# Patient Record
Sex: Female | Born: 1965 | Race: White | Hispanic: No | Marital: Married | Smoking: Never smoker
Health system: Southern US, Community
[De-identification: ages and names within clinical notes are randomized; demographics above are authoritative.]

## PROBLEM LIST (undated history)

## (undated) DIAGNOSIS — K7581 Nonalcoholic steatohepatitis (NASH): Secondary | ICD-10-CM

## (undated) DIAGNOSIS — K760 Fatty (change of) liver, not elsewhere classified: Secondary | ICD-10-CM

## (undated) DIAGNOSIS — E119 Type 2 diabetes mellitus without complications: Secondary | ICD-10-CM

## (undated) DIAGNOSIS — I864 Gastric varices: Secondary | ICD-10-CM

## (undated) DIAGNOSIS — E669 Obesity, unspecified: Secondary | ICD-10-CM

## (undated) HISTORY — DX: Obesity, unspecified: E66.9

## (undated) HISTORY — PX: ESOPHAGOGASTRODUODENOSCOPY: SHX1529

## (undated) HISTORY — DX: Gastric varices: I86.4

## (undated) HISTORY — DX: Fatty (change of) liver, not elsewhere classified: K76.0

---

## 1999-03-10 ENCOUNTER — Emergency Department (HOSPITAL_COMMUNITY): Admission: EM | Admit: 1999-03-10 | Discharge: 1999-03-10 | Payer: Self-pay | Admitting: *Deleted

## 2001-07-11 ENCOUNTER — Emergency Department (HOSPITAL_COMMUNITY): Admission: EM | Admit: 2001-07-11 | Discharge: 2001-07-11 | Payer: Self-pay | Admitting: Emergency Medicine

## 2005-05-24 ENCOUNTER — Encounter: Admission: RE | Admit: 2005-05-24 | Discharge: 2005-05-24 | Payer: Self-pay | Admitting: Gastroenterology

## 2008-05-05 ENCOUNTER — Ambulatory Visit: Payer: Self-pay | Admitting: Family Medicine

## 2008-05-10 ENCOUNTER — Ambulatory Visit: Payer: Self-pay | Admitting: *Deleted

## 2008-06-14 ENCOUNTER — Ambulatory Visit: Payer: Self-pay | Admitting: Family Medicine

## 2008-06-14 LAB — CONVERTED CEMR LAB
ALT: 35 units/L (ref 0–35)
BUN: 10 mg/dL (ref 6–23)
Basophils Absolute: 0 10*3/uL (ref 0.0–0.1)
Basophils Relative: 1 % (ref 0–1)
CO2: 18 meq/L — ABNORMAL LOW (ref 19–32)
Eosinophils Absolute: 0.1 10*3/uL (ref 0.0–0.7)
Hemoglobin: 14.9 g/dL (ref 12.0–15.0)
Lymphocytes Relative: 46 % (ref 12–46)
Monocytes Absolute: 0.5 10*3/uL (ref 0.1–1.0)
Monocytes Relative: 7 % (ref 3–12)
Neutro Abs: 3 10*3/uL (ref 1.7–7.7)
Platelets: 218 10*3/uL (ref 150–400)
Potassium: 4.2 meq/L (ref 3.5–5.3)
RBC: 4.77 M/uL (ref 3.87–5.11)
RDW: 13.2 % (ref 11.5–15.5)
TSH: 1.429 microintl units/mL (ref 0.350–4.50)

## 2008-06-18 ENCOUNTER — Encounter: Payer: Self-pay | Admitting: Family Medicine

## 2008-06-18 LAB — CONVERTED CEMR LAB: VLDL: 25 mg/dL (ref 0–40)

## 2008-08-02 ENCOUNTER — Ambulatory Visit: Payer: Self-pay | Admitting: Internal Medicine

## 2008-10-11 ENCOUNTER — Encounter: Payer: Self-pay | Admitting: Family Medicine

## 2008-10-11 ENCOUNTER — Ambulatory Visit: Payer: Self-pay | Admitting: Internal Medicine

## 2008-10-11 LAB — CONVERTED CEMR LAB: Chlamydia, DNA Probe: NEGATIVE

## 2008-10-14 ENCOUNTER — Ambulatory Visit (HOSPITAL_COMMUNITY): Admission: RE | Admit: 2008-10-14 | Discharge: 2008-10-14 | Payer: Self-pay | Admitting: Family Medicine

## 2008-11-15 ENCOUNTER — Ambulatory Visit: Payer: Self-pay | Admitting: Internal Medicine

## 2008-11-15 ENCOUNTER — Encounter: Payer: Self-pay | Admitting: Family Medicine

## 2008-11-15 LAB — CONVERTED CEMR LAB
BUN: 11 mg/dL (ref 6–23)
Calcium: 8.9 mg/dL (ref 8.4–10.5)
Chloride: 102 meq/L (ref 96–112)
Creatinine, Ser: 0.7 mg/dL (ref 0.40–1.20)

## 2008-11-22 ENCOUNTER — Ambulatory Visit: Payer: Self-pay | Admitting: Internal Medicine

## 2009-02-07 ENCOUNTER — Ambulatory Visit: Payer: Self-pay | Admitting: Family Medicine

## 2016-04-01 ENCOUNTER — Emergency Department (HOSPITAL_COMMUNITY)
Admission: EM | Admit: 2016-04-01 | Discharge: 2016-04-01 | Disposition: A | Discharge status: Home / Self Care | Payer: Self-pay | Attending: Emergency Medicine | Admitting: Emergency Medicine

## 2016-04-01 ENCOUNTER — Encounter (HOSPITAL_COMMUNITY): Payer: Self-pay | Admitting: Emergency Medicine

## 2016-04-01 DIAGNOSIS — E119 Type 2 diabetes mellitus without complications: Secondary | ICD-10-CM | POA: Insufficient documentation

## 2016-04-01 DIAGNOSIS — W07XXXA Fall from chair, initial encounter: Secondary | ICD-10-CM | POA: Insufficient documentation

## 2016-04-01 DIAGNOSIS — S01112A Laceration without foreign body of left eyelid and periocular area, initial encounter: Secondary | ICD-10-CM | POA: Insufficient documentation

## 2016-04-01 DIAGNOSIS — Y999 Unspecified external cause status: Secondary | ICD-10-CM | POA: Insufficient documentation

## 2016-04-01 DIAGNOSIS — S0191XA Laceration without foreign body of unspecified part of head, initial encounter: Secondary | ICD-10-CM

## 2016-04-01 DIAGNOSIS — Y929 Unspecified place or not applicable: Secondary | ICD-10-CM | POA: Insufficient documentation

## 2016-04-01 DIAGNOSIS — Y939 Activity, unspecified: Secondary | ICD-10-CM | POA: Insufficient documentation

## 2016-04-01 HISTORY — DX: Type 2 diabetes mellitus without complications: E11.9

## 2016-04-01 MED ORDER — LIDOCAINE-EPINEPHRINE 2 %-1:100000 IJ SOLN
20.0000 mL | Freq: Once | INTRAMUSCULAR | Status: AC
  Administered 2016-04-01: 20 mL
  Filled 2016-04-01: qty 1

## 2016-04-01 MED ORDER — BACITRACIN ZINC 500 UNIT/GM EX OINT
1.0000 "application " | TOPICAL_OINTMENT | Freq: Two times a day (BID) | CUTANEOUS | Status: DC
  Filled 2016-04-01: qty 0.9

## 2016-04-01 NOTE — ED Notes (Signed)
ED PA at bedside

## 2016-04-01 NOTE — ED Notes (Signed)
Bed: UI11 Expected date:  Expected time:  Means of arrival:  Comments: 50 yo Fall with head lac

## 2016-04-01 NOTE — ED Provider Notes (Signed)
[facility] DEPT Provider Note   CSN: [id number] Arrival date & time: 04/01/16  1245  First Provider Contact:  First MD Initiated Contact with Patient 04/01/16 1846        History   Chief Complaint Chief Complaint  Patient presents with  . Head Laceration    HPI Deborah Pham is a 50 y.o. female.  HPI history of diabetes who comes in for evaluation of facial laceration. Patient reports approximately 2 hours ago, she tripped over a chair hitting her face on the chair sustaining a laceration to the left eyebrow region. She denies any headache, loss of consciousness, vision changes, neck pain, nausea or vomiting, unusual otorrhea or rhinorrhea. No interventions try to improve symptoms. Palpation worsens discomfort. She reports last tetanus updated in November. No other modifying factors.  Past Medical History:  Diagnosis Date  . Diabetes mellitus without complication ([place])     There are no active problems to display for this patient.   No past surgical history on file.  OB History    No data available       Home Medications    Prior to Admission medications   Not on File    Family History No family history on file.  Social History Social History  Substance Use Topics  . Smoking status: Not on file  . Smokeless tobacco: Not on file  . Alcohol use Not on file     Allergies   Review of patient's allergies indicates no known allergies.   Review of Systems Review of Systems A 10 point review of systems was completed and was negative except for pertinent positives and negatives as mentioned in the history of present illness    Physical Exam Updated Vital Signs BP 132/71 (BP Location: Left Arm)   Pulse 75   Temp 98.7 F (37.1 C) (Oral)   Resp 16   SpO2 98%   Physical Exam  Constitutional: She appears well-developed. No distress.  Awake, alert and nontoxic in appearance  HENT:  Head: Normocephalic.  Right Ear: External ear normal.   Left Ear: External ear normal.  Mouth/Throat: Oropharynx is clear and moist.  Linear laceration that extends from middle left eyebrow to apex of nose bridge. Roughly 1.5 inches in length.  Eyes: Conjunctivae and EOM are normal. Pupils are equal, round, and reactive to light.  Neck: Normal range of motion. No JVD present.  No neck pain  Cardiovascular: Normal rate, regular rhythm and normal heart sounds.   Pulmonary/Chest: Effort normal and breath sounds normal. No stridor.  Abdominal: Soft. There is no tenderness.  Musculoskeletal: Normal range of motion.  Neurological:  Awake, alert, cooperative and aware of situation; motor strength bilaterally; sensation normal to light touch bilaterally; no facial asymmetry; tongue midline; major cranial nerves appear intact;  baseline gait without new ataxia.  Skin: No rash noted. She is not diaphoretic.  Psychiatric: She has a normal mood and affect. Her behavior is normal. Thought content normal.  Nursing note and vitals reviewed.    ED Treatments / Results  Labs (all labs ordered are listed, but only abnormal results are displayed) Labs Reviewed - No data to display  EKG  EKG Interpretation None       Radiology No results found.  Procedures Procedures (including critical care time)   LACERATION REPAIR Performed by: Verl Dicker Authorized by: Verl Dicker Consent: Verbal consent obtained. Risks and benefits: risks, benefits and alternatives were discussed Consent given by: patient Patient identity confirmed: provided  demographic data Prepped and Draped in normal sterile fashion Wound explored  Laceration Location: Left eyebrow  Laceration Length: 3.5 cm  No Foreign Bodies seen or palpated  Anesthesia: local infiltration  Local anesthetic: lidocaine 1 % with epinephrine  Anesthetic total: 5 ml  Irrigation method: syringe Amount of cleaning: standard  Skin closure: 5-0 Vicryl Rapide   Number of  sutures: 8   Technique: Simple interrupted   Patient tolerance: Patient tolerated the procedure well with no immediate complications.   Medications Ordered in ED Medications  bacitracin ointment 1 application (not administered)  lidocaine-EPINEPHrine (XYLOCAINE W/EPI) 2 %-1:100000 (with pres) injection 20 mL (20 mLs Infiltration Given 04/01/16 2011)     Initial Impression / Assessment and Plan / ED Course  I have reviewed the triage vital signs and the nursing notes.  Pertinent labs & imaging results that were available during my care of the patient were reviewed by me and considered in my medical decision making (see chart for details).  Clinical Course   Patient sustained laceration to left eyebrow and bridge of nose area after mechanical fall. [place] head CT rules negative, no indication for further imaging. Tdap UTD. Laceration occurred < 8 hours prior to repair which was well tolerated. Pt has no co morbidities to effect normal wound healing. Discussed suture home care w pt and answered questions. Pt to f-u for wound check in 4 days. Return precautions discussed. Pt overall appears very well, is hemodynamically stable w no complaints prior to dc.     Final Clinical Impressions(s) / ED Diagnoses   Final diagnoses:  Laceration of head, initial encounter    New Prescriptions New Prescriptions   No medications on file     Comer Locket, PA-C 04/01/16 2101    Gareth Morgan, MD 04/03/16 [phone]

## 2016-04-01 NOTE — Discharge Instructions (Signed)
Please keep your wound clean and dry. The stitches will dissolve on their own over the next week. You may continue using Vaseline to help the healing process. Return to ED for new or worsening symptoms or other signs of infection as we discussed.

## 2016-04-01 NOTE — ED Triage Notes (Signed)
Pt fell today after tripping on a chair and now has an approx. 3cm lac over her L eye. Denies LOC. Alert and oriented Hypertensive.

## 2016-06-21 ENCOUNTER — Emergency Department (HOSPITAL_COMMUNITY): Payer: Self-pay

## 2016-06-21 ENCOUNTER — Encounter (HOSPITAL_COMMUNITY): Payer: Self-pay

## 2016-06-21 ENCOUNTER — Inpatient Hospital Stay (HOSPITAL_COMMUNITY)
Admission: EM | Admit: 2016-06-21 | Discharge: 2016-06-23 | DRG: 815 | Disposition: A | Discharge status: Home / Self Care | Payer: Self-pay | Attending: Internal Medicine | Admitting: Internal Medicine

## 2016-06-21 DIAGNOSIS — E872 Acidosis: Secondary | ICD-10-CM | POA: Diagnosis present

## 2016-06-21 DIAGNOSIS — D696 Thrombocytopenia, unspecified: Secondary | ICD-10-CM | POA: Diagnosis present

## 2016-06-21 DIAGNOSIS — E8809 Other disorders of plasma-protein metabolism, not elsewhere classified: Secondary | ICD-10-CM | POA: Diagnosis present

## 2016-06-21 DIAGNOSIS — E669 Obesity, unspecified: Secondary | ICD-10-CM | POA: Diagnosis present

## 2016-06-21 DIAGNOSIS — R188 Other ascites: Secondary | ICD-10-CM | POA: Diagnosis present

## 2016-06-21 DIAGNOSIS — Z7984 Long term (current) use of oral hypoglycemic drugs: Secondary | ICD-10-CM

## 2016-06-21 DIAGNOSIS — E1165 Type 2 diabetes mellitus with hyperglycemia: Secondary | ICD-10-CM | POA: Diagnosis present

## 2016-06-21 DIAGNOSIS — D735 Infarction of spleen: Principal | ICD-10-CM | POA: Diagnosis present

## 2016-06-21 DIAGNOSIS — I868 Varicose veins of other specified sites: Secondary | ICD-10-CM | POA: Diagnosis present

## 2016-06-21 DIAGNOSIS — N10 Acute tubulo-interstitial nephritis: Secondary | ICD-10-CM

## 2016-06-21 DIAGNOSIS — E876 Hypokalemia: Secondary | ICD-10-CM | POA: Diagnosis present

## 2016-06-21 DIAGNOSIS — K7469 Other cirrhosis of liver: Secondary | ICD-10-CM | POA: Diagnosis present

## 2016-06-21 DIAGNOSIS — N390 Urinary tract infection, site not specified: Secondary | ICD-10-CM | POA: Diagnosis present

## 2016-06-21 DIAGNOSIS — K7581 Nonalcoholic steatohepatitis (NASH): Secondary | ICD-10-CM | POA: Diagnosis present

## 2016-06-21 DIAGNOSIS — N3000 Acute cystitis without hematuria: Secondary | ICD-10-CM

## 2016-06-21 DIAGNOSIS — D731 Hypersplenism: Secondary | ICD-10-CM | POA: Diagnosis present

## 2016-06-21 DIAGNOSIS — K766 Portal hypertension: Secondary | ICD-10-CM | POA: Diagnosis present

## 2016-06-21 DIAGNOSIS — R1012 Left upper quadrant pain: Secondary | ICD-10-CM

## 2016-06-21 DIAGNOSIS — Z6841 Body Mass Index (BMI) 40.0 and over, adult: Secondary | ICD-10-CM

## 2016-06-21 HISTORY — DX: Nonalcoholic steatohepatitis (NASH): K75.81

## 2016-06-21 LAB — LIPASE, BLOOD: LIPASE: 95 U/L — AB (ref 11–51)

## 2016-06-21 LAB — URINALYSIS, ROUTINE W REFLEX MICROSCOPIC
Glucose, UA: 500 mg/dL — AB
KETONES UR: NEGATIVE mg/dL
NITRITE: POSITIVE — AB
PROTEIN: 30 mg/dL — AB
Specific Gravity, Urine: 1.016 (ref 1.005–1.030)
pH: 6 (ref 5.0–8.0)

## 2016-06-21 LAB — CBC
HEMATOCRIT: 38.8 % (ref 36.0–46.0)
Hemoglobin: 14.6 g/dL (ref 12.0–15.0)
MCH: 33.2 pg (ref 26.0–34.0)
MCHC: 37.6 g/dL — ABNORMAL HIGH (ref 30.0–36.0)
MCV: 88.2 fL (ref 78.0–100.0)
PLATELETS: 106 10*3/uL — AB (ref 150–400)
RBC: 4.4 MIL/uL (ref 3.87–5.11)
RDW: 13.7 % (ref 11.5–15.5)
WBC: 15.1 10*3/uL — AB (ref 4.0–10.5)

## 2016-06-21 LAB — COMPREHENSIVE METABOLIC PANEL
ALT: 30 U/L (ref 14–54)
AST: 47 U/L — AB (ref 15–41)
Albumin: 2.1 g/dL — ABNORMAL LOW (ref 3.5–5.0)
Alkaline Phosphatase: 225 U/L — ABNORMAL HIGH (ref 38–126)
Anion gap: 10 (ref 5–15)
BILIRUBIN TOTAL: 3.5 mg/dL — AB (ref 0.3–1.2)
BUN: 13 mg/dL (ref 6–20)
CHLORIDE: 99 mmol/L — AB (ref 101–111)
CO2: 23 mmol/L (ref 22–32)
CREATININE: 1.27 mg/dL — AB (ref 0.44–1.00)
Calcium: 8 mg/dL — ABNORMAL LOW (ref 8.9–10.3)
GFR, EST AFRICAN AMERICAN: 56 mL/min — AB (ref >60)
GFR, EST NON AFRICAN AMERICAN: 48 mL/min — AB (ref >60)
Glucose, Bld: 369 mg/dL — ABNORMAL HIGH (ref 65–99)
POTASSIUM: 3.1 mmol/L — AB (ref 3.5–5.1)
Sodium: 132 mmol/L — ABNORMAL LOW (ref 135–145)
TOTAL PROTEIN: 7.1 g/dL (ref 6.5–8.1)

## 2016-06-21 LAB — URINE MICROSCOPIC-ADD ON

## 2016-06-21 LAB — LACTIC ACID, PLASMA: Lactic Acid, Venous: 2.7 mmol/L (ref 0.5–1.9)

## 2016-06-21 LAB — GLUCOSE, CAPILLARY
GLUCOSE-CAPILLARY: 197 mg/dL — AB (ref 65–99)
Glucose-Capillary: 202 mg/dL — ABNORMAL HIGH (ref 65–99)

## 2016-06-21 LAB — PROTIME-INR
INR: 1.29
Prothrombin Time: 16.2 seconds — ABNORMAL HIGH (ref 11.4–15.2)

## 2016-06-21 LAB — CBG MONITORING, ED: Glucose-Capillary: 355 mg/dL — ABNORMAL HIGH (ref 65–99)

## 2016-06-21 LAB — I-STAT BETA HCG BLOOD, ED (MC, WL, AP ONLY): I-stat hCG, quantitative: <5 m[IU]/mL (ref <5)

## 2016-06-21 MED ORDER — SODIUM CHLORIDE 0.9% FLUSH
3.0000 mL | Freq: Two times a day (BID) | INTRAVENOUS | Status: DC
  Administered 2016-06-21 – 2016-06-23 (×4): 3 mL via INTRAVENOUS

## 2016-06-21 MED ORDER — DEXTROSE 5 % IV SOLN
1.0000 g | Freq: Once | INTRAVENOUS | Status: AC
  Administered 2016-06-21: 1 g via INTRAVENOUS
  Filled 2016-06-21: qty 10

## 2016-06-21 MED ORDER — ONDANSETRON HCL 4 MG/2ML IJ SOLN: 4.0000 mg | Freq: Four times a day (QID) | INTRAMUSCULAR | Status: DC | PRN

## 2016-06-21 MED ORDER — ONDANSETRON HCL 4 MG PO TABS: 4.0000 mg | ORAL_TABLET | Freq: Four times a day (QID) | ORAL | Status: DC | PRN

## 2016-06-21 MED ORDER — ACETAMINOPHEN 325 MG PO TABS
650.0000 mg | ORAL_TABLET | Freq: Four times a day (QID) | ORAL | Status: DC | PRN
  Administered 2016-06-21 – 2016-06-22 (×3): 650 mg via ORAL
  Filled 2016-06-21 (×4): qty 2

## 2016-06-21 MED ORDER — ACETAMINOPHEN 650 MG RE SUPP: 650.0000 mg | Freq: Four times a day (QID) | RECTAL | Status: DC | PRN

## 2016-06-21 MED ORDER — SODIUM CHLORIDE 0.9 % IV BOLUS (SEPSIS)
1000.0000 mL | Freq: Once | INTRAVENOUS | Status: AC
Start: 2016-06-21 — End: 2016-06-21
  Administered 2016-06-21: 1000 mL via INTRAVENOUS

## 2016-06-21 MED ORDER — DEXTROSE 5 % IV SOLN
1.0000 g | INTRAVENOUS | Status: DC
  Administered 2016-06-22: 1 g via INTRAVENOUS
  Filled 2016-06-21: qty 10

## 2016-06-21 MED ORDER — INSULIN ASPART 100 UNIT/ML ~~LOC~~ SOLN
0.0000 [IU] | Freq: Three times a day (TID) | SUBCUTANEOUS | Status: DC
  Administered 2016-06-22: 15 [IU] via SUBCUTANEOUS
  Administered 2016-06-22: 5 [IU] via SUBCUTANEOUS
  Administered 2016-06-23: 3 [IU] via SUBCUTANEOUS

## 2016-06-21 MED ORDER — SODIUM CHLORIDE 0.9 % IV BOLUS (SEPSIS)
1000.0000 mL | Freq: Once | INTRAVENOUS | Status: AC
  Administered 2016-06-21: 1000 mL via INTRAVENOUS

## 2016-06-21 MED ORDER — ENOXAPARIN SODIUM 40 MG/0.4ML ~~LOC~~ SOLN
40.0000 mg | SUBCUTANEOUS | Status: DC
  Administered 2016-06-22 – 2016-06-23 (×2): 40 mg via SUBCUTANEOUS
  Filled 2016-06-21 (×2): qty 0.4

## 2016-06-21 MED ORDER — OXYCODONE HCL 5 MG PO TABS: 5.0000 mg | ORAL_TABLET | ORAL | Status: DC | PRN

## 2016-06-21 MED ORDER — POTASSIUM CHLORIDE CRYS ER 20 MEQ PO TBCR
40.0000 meq | EXTENDED_RELEASE_TABLET | Freq: Once | ORAL | Status: AC
  Administered 2016-06-21: 40 meq via ORAL
  Filled 2016-06-21: qty 2

## 2016-06-21 MED ORDER — IOPAMIDOL (ISOVUE-300) INJECTION 61%
INTRAVENOUS | Status: AC
  Filled 2016-06-21: qty 100

## 2016-06-21 MED ORDER — SENNOSIDES-DOCUSATE SODIUM 8.6-50 MG PO TABS: 1.0000 | ORAL_TABLET | Freq: Every evening | ORAL | Status: DC | PRN

## 2016-06-21 MED ORDER — IOPAMIDOL (ISOVUE-300) INJECTION 61%
INTRAVENOUS | Status: AC
  Administered 2016-06-21: 100 mL
  Filled 2016-06-21: qty 100

## 2016-06-21 MED ORDER — INSULIN ASPART 100 UNIT/ML ~~LOC~~ SOLN
0.0000 [IU] | Freq: Every day | SUBCUTANEOUS | Status: DC
  Administered 2016-06-21 – 2016-06-22 (×2): 2 [IU] via SUBCUTANEOUS

## 2016-06-21 NOTE — ED Provider Notes (Signed)
[facility] DEPT Provider Note   CSN: [id number] Arrival date & time: 06/21/16  1403     History   Chief Complaint Chief Complaint  Deborah Pham presents with  . Abdominal Pain    HPI Deborah Pham is a 50 y.o. female.  HPI   Deborah Pham is a 49 year old female, preferred language Spanish, with a history of diabetes on metformin who presents to the emergency department with intermittent left-sided and epigastric abdominal pain for 1 week. Pain is nonradiating, she's never had this before, she has taken Advil with some relief, with associated bloating and anorexia. Deborah Pham had subjective fever, chills, headache which has subsided. Deborah Pham denies alcohol or other drug use. Deborah Pham denies nausea, vomiting, chest pain, shortness of breath, dysuria, vaginal discharge, blood in her stool.  Past Medical History:  Diagnosis Date  . Diabetes mellitus without complication ([place])   . NASH (nonalcoholic steatohepatitis)     Deborah Pham Active Problem List   Diagnosis Date Noted  . 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 Acute pyelonephritis 06/21/2016  . Hypokalemia 06/21/2016  . Other cirrhosis of liver ([place]) 06/21/2016    History reviewed. No pertinent surgical history.  OB History    No data available       Home Medications    Prior to Admission medications   Medication Sig Start Date End Date Taking? Authorizing Provider  metFORMIN (GLUCOPHAGE-XR) 500 MG 24 hr tablet Take 500 mg by mouth daily with breakfast.   Yes Historical Provider, MD    Family History Family History  Problem Relation Age of Onset  . Liver disease Father   . Alcoholism Father     Social History Social History  Substance Use Topics  . Smoking status: Never Smoker  . Smokeless tobacco: Never Used  . Alcohol use No     Allergies   Review of Deborah Pham's allergies indicates no known allergies.   Review of Systems Review of Systems  Constitutional: Positive for appetite change, chills and  fever.  HENT: Negative for sore throat and trouble swallowing.   Respiratory: Negative for cough, chest tightness and shortness of breath.   Cardiovascular: Negative for chest pain.  Gastrointestinal: Positive for abdominal distention and abdominal pain. Negative for blood in stool, constipation, diarrhea, nausea and vomiting.  Genitourinary: Negative for dysuria, hematuria and vaginal discharge.  Musculoskeletal: Negative for back pain.  Skin: Negative for rash.  Neurological: Positive for headaches. Negative for dizziness, syncope, weakness and numbness.  Psychiatric/Behavioral: Negative for confusion.     Physical Exam Updated Vital Signs BP 150/80 (BP Location: Left Arm)   Pulse (!) 130   Temp 98.1 F (36.7 C) (Oral)   Resp 20   SpO2 99%   Physical Exam  Constitutional: She appears well-developed and well-nourished. No distress.  HENT:  Head: Normocephalic and atraumatic.  Mouth/Throat: Mucous membranes are not dry.  Eyes: Conjunctivae are normal. Pupils are equal, round, and reactive to light. No scleral icterus (mild).  Cardiovascular: Regular rhythm and normal heart sounds.  Tachycardia present.  Exam reveals no gallop and no friction rub.   No murmur heard. Pulses:      Dorsalis pedis pulses are 2+ on the right side, and 2+ on the left side.  Pulmonary/Chest: Effort normal and breath sounds normal. No respiratory distress. She has no decreased breath sounds. She has no wheezes. She has no rhonchi. She has no rales.  Abdominal: Soft. Normal appearance and bowel sounds are normal. She exhibits distension. She exhibits no fluid wave. There is tenderness in  the epigastric area and left upper quadrant. There is no tenderness at McBurney's point and negative Murphy's sign.  Musculoskeletal: Normal range of motion. She exhibits edema (1+ pitting of BLE).  Neurological: She is alert. Coordination normal.  Skin: Skin is warm and dry. She is not diaphoretic.  Psychiatric: She has a  normal mood and affect. Her behavior is normal.  Nursing note and vitals reviewed.    ED Treatments / Results  Labs (all labs ordered are listed, but only abnormal results are displayed) Labs Reviewed  LIPASE, BLOOD - Abnormal; Notable for the following:       Result Value   Lipase 95 (*)    All other components within normal limits  COMPREHENSIVE METABOLIC PANEL - Abnormal; Notable for the following:    Sodium 132 (*)    Potassium 3.1 (*)    Chloride 99 (*)    Glucose, Bld 369 (*)    Creatinine, Ser 1.27 (*)    Calcium 8.0 (*)    Albumin 2.1 (*)    AST 47 (*)    Alkaline Phosphatase 225 (*)    Total Bilirubin 3.5 (*)    GFR calc non Af Amer 48 (*)    GFR calc Af Amer 56 (*)    All other components within normal limits  CBC - Abnormal; Notable for the following:    WBC 15.1 (*)    MCHC 37.6 (*)    Platelets 106 (*)    All other components within normal limits  URINALYSIS, ROUTINE W REFLEX MICROSCOPIC (NOT AT [facility]) - Abnormal; Notable for the following:    Color, Urine AMBER (*)    APPearance CLOUDY (*)    Glucose, UA 500 (*)    Hgb urine dipstick LARGE (*)    Bilirubin Urine SMALL (*)    Protein, ur 30 (*)    Nitrite POSITIVE (*)    Leukocytes, UA LARGE (*)    All other components within normal limits  URINE MICROSCOPIC-ADD ON - Abnormal; Notable for the following:    Squamous Epithelial / LPF 6-30 (*)    Bacteria, UA MANY (*)    All other components within normal limits  LACTIC ACID, PLASMA - Abnormal; Notable for the following:    Lactic Acid, Venous 2.7 (*)    All other components within normal limits  PROTIME-INR - Abnormal; Notable for the following:    Prothrombin Time 16.2 (*)    All other components within normal limits  GLUCOSE, CAPILLARY - Abnormal; Notable for the following:    Glucose-Capillary 197 (*)    All other components within normal limits  GLUCOSE, CAPILLARY - Abnormal; Notable for the following:    Glucose-Capillary 202 (*)    All other  components within normal limits  CBG MONITORING, ED - Abnormal; Notable for the following:    Glucose-Capillary 355 (*)    All other components within normal limits  CULTURE, BLOOD (ROUTINE X 2)  CULTURE, BLOOD (ROUTINE X 2)  URINE CULTURE  COMPREHENSIVE METABOLIC PANEL  CBC  I-STAT BETA HCG BLOOD, ED ([facility], [facility], [facility] ONLY)    EKG  EKG Interpretation None       Radiology US Abdomen Complete  Result Date: 06/21/2016 CLINICAL DATA:  Increased bilirubin and abdomen pain for 1 week. EXAM: ABDOMEN ULTRASOUND COMPLETE COMPARISON:  None. FINDINGS: Gallbladder: No gallstones or wall thickening visualized. No sonographic Murphy sign noted by sonographer. Common bile duct: Diameter: 3.7 mm. Liver: No focal lesion identified. There is diffuse increased echotexture  of the liver with nodular contour of the liver border. IVC: No abnormality visualized. Pancreas: Limited visualization. Spleen: Measures 15.7 cm with volume of 987.1 cm 3. 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 Right Kidney: Length: 12.5 cm. Echogenicity within normal limits. No mass or hydronephrosis visualized. Left Kidney: Length: 12.8 cm. Echogenicity within normal limits. No mass or hydronephrosis visualized. Abdominal aorta: No aneurysm visualized. Other findings: None. 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 Electronically Signed   By: Abelardo Diesel M.D.   On: 06/21/2016 18:30   Ct Abdomen Pelvis W Contrast  Result Date: 06/21/2016 CLINICAL DATA:  Abdominal pain EXAM: CT ABDOMEN AND PELVIS WITH CONTRAST TECHNIQUE: Multidetector CT imaging of the abdomen and pelvis was performed using the standard protocol following bolus administration of intravenous contrast. CONTRAST:  127m ISOVUE-300 IOPAMIDOL (ISOVUE-300) INJECTION 61% COMPARISON:  Same day ultrasound of the abdomen FINDINGS: Lower chest: There is cardiomegaly without pericardial effusion. Small hiatal hernia is present. Streaky bibasilar  atelectasis left greater than right with trace effusions. No pneumothorax. Hepatobiliary: Hepatomegaly with nodular liver surface. The left lobe of the liver drapes over the enlarged appearing spleen. The enlarged spleen. Pancreas: Unremarkable. No pancreatic ductal dilatation or surrounding inflammatory changes. Spleen: The spleen is enlarged and measures 14.6 cm AP x 6.6 cm transverse at the splenic hilum. There are triangular areas of hypodensity within this between, the largest measuring approximately 2.2 cm, series 201 image 15 extending to the lateral surface of the spleen. Small amount of fluid is noted medially adjacent to the upper pole of the spleen. Differential possibilities may include splenic laceration from splenomegaly or more likely splenic infarct given similar findings in the kidneys. No acute hemorrhage identified. Adrenals/Urinary Tract: Adrenal glands are unremarkable. Inhomogeneous wedge-shaped shaped areas of hypoperfusion involving both kidneys. Left interpolar medial hypodensity consistent with a cyst measuring approximately 18 mm on coronal reformats. Mild bilateral nephromegaly. Bladder is unremarkable. Stomach/Bowel: The stomach is contracted in appearance. The normal bowel rotation. No inflammation or bowel obstruction. Scattered colonic diverticulosis more so along the sigmoid colon. Appendix is not definitively identified. Vascular/Lymphatic: Paraesophageal, epigastric and splenic varices are noted with splenorenal shunting. No portal or splenic vein thrombosis. Small para-aortic lymph nodes. Reproductive: Uterus and bilateral adnexa are unremarkable. Other: Small amount of ascites overlying the liver, seen within the pelvis and with edema along the left paracolic gutter. Musculoskeletal: No acute osseous abnormality. Osteophytes along the anterior aspect of the visualized lower thoracic spine. L4-5 and L5-S1 facet hypertrophy and sclerosis. IMPRESSION: Hepatosplenomegaly with  cirrhotic appearing liver, small volume ascites and numerous varices as above described. Small hypodense triangular shaped abnormality involving the upper pole of the spleen which likely represents a splenic infarct given similar wedge-shaped areas of hypoperfusion of both kidneys. Subacute splenic laceration as it extends to the surface of the spleen cannot be entirely excluded in light of trace fluid medially in the upper pole, but favor infarcts. Electronically Signed   By: DAshley RoyaltyM.D.   On: 06/21/2016 20:11    Procedures Procedures (including critical care time)  Medications Ordered in ED Medications  iopamidol (ISOVUE-300) 61 % injection (not administered)  insulin aspart (novoLOG) injection 0-15 Units (not administered)  insulin aspart (novoLOG) injection 0-5 Units (2 Units Subcutaneous Given 06/21/16 2339)  enoxaparin (LOVENOX) injection 40 mg (not administered)  sodium chloride flush (NS) 0.9 % injection 3 mL (3 mLs Intravenous Given 06/21/16 2340)  acetaminophen (TYLENOL) tablet 650 mg (not administered)    Or  acetaminophen (TYLENOL) suppository  650 mg (not administered)  oxyCODONE (Oxy IR/ROXICODONE) immediate release tablet 5 mg (not administered)  senna-docusate (Senokot-S) tablet 1 tablet (not administered)  ondansetron (ZOFRAN) tablet 4 mg (not administered)    Or  ondansetron (ZOFRAN) injection 4 mg (not administered)  cefTRIAXone (ROCEPHIN) 1 g in dextrose 5 % 50 mL IVPB (not administered)  sodium chloride 0.9 % bolus 1,000 mL (0 mLs Intravenous Stopped 06/21/16 1956)  potassium chloride SA (K-DUR,KLOR-CON) CR tablet 40 mEq (40 mEq Oral Given 06/21/16 1950)  iopamidol (ISOVUE-300) 61 % injection (100 mLs  Contrast Given 06/21/16 1924)  sodium chloride 0.9 % bolus 1,000 mL (0 mLs Intravenous Stopped 06/21/16 2235)  cefTRIAXone (ROCEPHIN) 1 g in dextrose 5 % 50 mL IVPB (0 g Intravenous Stopped 06/21/16 2235)     Initial Impression / Assessment and Plan / ED Course    I have reviewed the triage vital signs and the nursing notes.  Pertinent labs & imaging results that were available during my care of the Deborah Pham were reviewed by me and considered in my medical decision making (see chart for details).  Clinical Course   4:00pm Discussed lab work with Deborah Pham and indication for imaging. Deborah Pham was agreeable to the plan   Deborah Pham with left upper quadrant and epigastric abdominal pain for 1 week. Labwork significant for leukocytosis, hypokalemia, elevated serum creatinine, thrombocytopenia, elevated alkaline phosphatase, elevated total bilirubin, UA reveals UTI the Deborah Pham is asymptomatic. No anion gap. Ultrasound revealed cirrhosis of liver with splenic varices. CT abdomen revealed hepatosplenomegaly, cirrhotic-appearing liver, small volume of ascites and possible splenic infarct. After assessing the case with Dr. Regenia Skeeter, we felt admission to the hospital was indicated. We'll consult the hospitalist team for admission.  I spoke with Dr. Loleta Books who will admit the Deborah Pham for further evaluation and treatment. Thank you Dr. Loleta Books for your consult, time, and care of this Deborah Pham.  Deborah Pham case discussed with the Deborah Pham seen by Dr. Regenia Skeeter who agrees with the above plan.  Final Clinical Impressions(s) / ED Diagnoses   Final diagnoses:  Left upper quadrant pain    New Prescriptions Current Discharge Medication List       Kalman Drape, [place] 06/22/16 0002    Sherwood Gambler, MD 06/23/16 1009

## 2016-06-21 NOTE — H&P (Signed)
History and Physical  Patient Name: Deborah Pham [facility]     YTK:[id number]    DOB: 04-27-1966    DOA: 06/21/2016 PCP: Thurman Coyer, MD   Patient coming from: Home  Chief Complaint: LUQ abdominal pain  HPI: Deborah Pham is a 50 y.o. female with a past medical history significant for NIDDM, obesity and NASH who presents with 1 week LUQ abdominal pain, worse today.  All history collected through telephonic interpreter.  The patient was in her usual state of health until about one week ago, she was getting ready for bed, when she had sudden onset of noticing some upper abdominal pain, primarily left upper quadrant. Over the next week this was intermittent, worse with movement, but mild to moderate in intensity only, associated with feeling bloated. She took Alka-Seltzer and Advil, which helped somewhat.  Then today, the patient awoke with worse pain. The pain is in the left upper quadrant, it is constant, it is moderate to severe, it does not radiate. It was spontaneous and there is no trauma, but it wasn't more severe than previous and did not go away with home treatments and so she came to the ER.   ED course: -Afebrile, heart rate 130, respirations 30s, blood pressure 150/80, pulse oximetry normal -Na 132 (corrects to 136 given glucose), K 3.1, Cr 1.27 (baseline 0.7, 7 years ago), WBC 15.1 K, Hgb 14.6 -Albumin 2.1, total bilirubin 3.5, thrombocytopenia 106K -Urinalysis shows nitrates, leukocytes to numerous to count -Lipase 95 -Preg test negative -Right upper quadrant ultrasound shows no evidence of cholecystitis, but does show hypersplenism and a nodular contour of the liver, no ascites noted -CT of the abdomen and pelvis with contrast showed splenic infarct, marked hypersplenism, findings of cirrhosis, no mention of ascites -She was given ceftriaxone for UTI/early sepsis and TRH were asked to evaluate for admission  She has had no fever, chills, syncope,  confusion. She has had no cough, no productive cough, no shortness of breath. She has had no dysuria, no hematuria, no urinary frequency, no foul-smelling urine, no urinary urgency.  She has a father with alcoholic liver disease, who is now deceased.  She has been told years ago that she has NASH.        ROS: Review of Systems  Constitutional: Negative for chills, fever and malaise/fatigue.  Gastrointestinal: Positive for abdominal pain. Negative for blood in stool and melena.  Genitourinary: Negative for dysuria, flank pain, frequency, hematuria and urgency.  All other systems reviewed and are negative.         Past Medical History:  Diagnosis Date  . Diabetes mellitus without complication ([place])   . NASH (nonalcoholic steatohepatitis)     History reviewed. No pertinent surgical history.  Social History: Patient lives with her husband.  She worked in a [employer].  The patient walks unassisted.  She does not smoke.  She denies alcohol.    No Known Allergies  Family history: family history includes Alcoholism in her father; Liver disease in her father.  Prior to Admission medications   Medication Sig Start Date End Date Taking? Authorizing Provider  metFORMIN (GLUCOPHAGE-XR) 500 MG 24 hr tablet Take 500 mg by mouth daily with breakfast.   Yes Historical Provider, MD       Physical Exam: BP 134/88   Pulse 115   Temp 98.1 F (36.7 C) (Oral)   Resp (!) 38   SpO2 97%  General appearance: Well-developed, obese adult female, alert and in no acute distress.  Eyes: Anicteric, conjunctiva pink, lids and lashes normal. PERRL.    ENT: No nasal deformity, discharge, epistaxis.  Hearing normal. OP moist without lesions.   Neck: No neck masses.  Trachea midline.  No thyromegaly/tenderness. Lymph: No cervical or supraclavicular lymphadenopathy. Skin: Warm and dry.  No jaundice.  No suspicious rashes or lesions.  No stigmata of chronic liver disease. Cardiac: Tachycardic,  regular, nl S1-S2, no murmurs appreciated.  Capillary refill is brisk.  JVP not visible.  No LE edema.  Radial and DP pulses 2+ and symmetric. Respiratory: Increased respirations, no increased WOB.  CTAB without rales or wheezes. Abdomen: Abdomen soft.  Mild TTP in LUQ with deep palpation, no rebound, guarding. No distension, hepatosplenomegaly given habitus.  I do not appreciate fluid/ascites.   MSK: No deformities or effusions.  No cyanosis or clubbing. Neuro: Cranial nerves normal.  Sensation intact to light touch. Speech is fluent.  Muscle strength normal.    Psych: Sensorium intact and responding to questions, attention normal.  Behavior appropriate.  Affect normal.  Judgment and insight appear normal.     Labs on Admission:  I have personally reviewed following labs and imaging studies: CBC:  Recent Labs Lab 06/21/16 1416  WBC 15.1*  HGB 14.6  HCT 38.8  MCV 88.2  PLT 161*   Basic Metabolic Panel:  Recent Labs Lab 06/21/16 1416  NA 132*  K 3.1*  CL 99*  CO2 23  GLUCOSE 369*  BUN 13  CREATININE 1.27*  CALCIUM 8.0*   GFR: CrCl cannot be calculated (Unknown ideal weight.).  Liver Function Tests:  Recent Labs Lab 06/21/16 1416  AST 47*  ALT 30  ALKPHOS 225*  BILITOT 3.5*  PROT 7.1  ALBUMIN 2.1*    Recent Labs Lab 06/21/16 1416  LIPASE 95*   No results for input(s): AMMONIA in the last 168 hours. Coagulation Profile: No results for input(s): INR, PROTIME in the last 168 hours. Cardiac Enzymes: No results for input(s): CKTOTAL, CKMB, CKMBINDEX, TROPONINI in the last 168 hours. BNP (last 3 results) No results for input(s): PROBNP in the last 8760 hours. HbA1C: No results for input(s): HGBA1C in the last 72 hours. CBG:  Recent Labs Lab 06/21/16 1412  GLUCAP 355*   Lipid Profile: No results for input(s): CHOL, HDL, LDLCALC, TRIG, CHOLHDL, LDLDIRECT in the last 72 hours. Thyroid Function Tests: No results for input(s): TSH, T4TOTAL, FREET4,  T3FREE, THYROIDAB in the last 72 hours. Anemia Panel: No results for input(s): VITAMINB12, FOLATE, FERRITIN, TIBC, IRON, RETICCTPCT in the last 72 hours. Sepsis Labs: Lactate pending Invalid input(s): PROCALCITONIN, LACTICIDVEN No results found for this or any previous visit (from the past 240 hour(s)).       Radiological Exams on Admission: Personally reviewed: US Abdomen Complete  Result Date: 06/21/2016 CLINICAL DATA:  Increased bilirubin and abdomen pain for 1 week. EXAM: ABDOMEN ULTRASOUND COMPLETE COMPARISON:  None. FINDINGS: Gallbladder: No gallstones or wall thickening visualized. No sonographic Murphy sign noted by sonographer. Common bile duct: Diameter: 3.7 mm. Liver: No focal lesion identified. There is diffuse increased echotexture of the liver with nodular contour of the liver border. IVC: No abnormality visualized. Pancreas: Limited visualization. Spleen: Measures 15.7 cm with volume of 987.1 cm 3. 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 Right Kidney: Length: 12.5 cm. Echogenicity within normal limits. No mass or hydronephrosis visualized. Left Kidney: Length: 12.8 cm. Echogenicity within normal limits. No mass or hydronephrosis visualized. Abdominal aorta: No aneurysm visualized. Other findings: None. IMPRESSION: Findings suggesting cirrhosis of liver with splenic  varices identified. Electronically Signed   By: Abelardo Diesel M.D.   On: 06/21/2016 18:30   Ct Abdomen Pelvis W Contrast  Result Date: 06/21/2016 CLINICAL DATA:  Abdominal pain EXAM: CT ABDOMEN AND PELVIS WITH CONTRAST TECHNIQUE: Multidetector CT imaging of the abdomen and pelvis was performed using the standard protocol following bolus administration of intravenous contrast. CONTRAST:  127m ISOVUE-300 IOPAMIDOL (ISOVUE-300) INJECTION 61% COMPARISON:  Same day ultrasound of the abdomen FINDINGS: Lower chest: There is cardiomegaly without pericardial effusion. Small hiatal hernia is present. Streaky bibasilar atelectasis left  greater than right with trace effusions. No pneumothorax. Hepatobiliary: Hepatomegaly with nodular liver surface. The left lobe of the liver drapes over the enlarged appearing spleen. The enlarged spleen. Pancreas: Unremarkable. No pancreatic ductal dilatation or surrounding inflammatory changes. Spleen: The spleen is enlarged and measures 14.6 cm AP x 6.6 cm transverse at the splenic hilum. There are triangular areas of hypodensity within this between, the largest measuring approximately 2.2 cm, series 201 image 15 extending to the lateral surface of the spleen. Small amount of fluid is noted medially adjacent to the upper pole of the spleen. Differential possibilities may include splenic laceration from splenomegaly or more likely splenic infarct given similar findings in the kidneys. No acute hemorrhage identified. Adrenals/Urinary Tract: Adrenal glands are unremarkable. Inhomogeneous wedge-shaped shaped areas of hypoperfusion involving both kidneys. Left interpolar medial hypodensity consistent with a cyst measuring approximately 18 mm on coronal reformats. Mild bilateral nephromegaly. Bladder is unremarkable. Stomach/Bowel: The stomach is contracted in appearance. The normal bowel rotation. No inflammation or bowel obstruction. Scattered colonic diverticulosis more so along the sigmoid colon. Appendix is not definitively identified. Vascular/Lymphatic: Paraesophageal, epigastric and splenic varices are noted with splenorenal shunting. No portal or splenic vein thrombosis. Small para-aortic lymph nodes. Reproductive: Uterus and bilateral adnexa are unremarkable. Other: Small amount of ascites overlying the liver, seen within the pelvis and with edema along the left paracolic gutter. Musculoskeletal: No acute osseous abnormality. Osteophytes along the anterior aspect of the visualized lower thoracic spine. L4-5 and L5-S1 facet hypertrophy and sclerosis. IMPRESSION: Hepatosplenomegaly with cirrhotic appearing  liver, small volume ascites and numerous varices as above described. Small hypodense triangular shaped abnormality involving the upper pole of the spleen which likely represents a splenic infarct given similar wedge-shaped areas of hypoperfusion of both kidneys. Subacute splenic laceration as it extends to the surface of the spleen cannot be entirely excluded in light of trace fluid medially in the upper pole, but favor infarcts. Electronically Signed   By: DAshley RoyaltyM.D.   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  Discussed imaging with Gen Surg, and agree, given time course of pain, abscence of trauma, splenic laceration/rupture is unlikely.  Doubt embolism (endocarditis/atrial fibrillation), but will attempt to rule out.  Unfortunately, cultures not obtained before administration of Abx.    -Oxycodone and/or acetaminophen PRN for pain -Telemetry for afib monitoring overnight -Blood cultures obtained     2. Hypokalemia:  Supplemented orally.  -Repeat BMP tomorrow   3. UTI, possible sepsis:  Meets SIRS criteria at present, although this could also be related to her cirrhosis and splenic infarct.  No symptoms of UTI.  -Continue ceftriaxone -Add urine culture -Check lactic acid and treat with continued IV antibiotics if elevated.   4. NIDDM:   -Hold metformin -SSI while in hospital   5. Cirrhosis:  New diagnosis.  Has previous diagnosis of NASH, and this seems most likely etiology.  Albumin somewhat low, total bilirubin 3.5, platelets mildly low.  No significant fluid overload at present.  -Check INR -Will discuss with GI tomorrow re: inpatient consult or outpatient follow up       DVT prophylaxis: Lovenox  Code Status: FULL  Family Communication: Husband at bedside  Disposition Plan: Anticipate observation overnight for suspected hypersplenism  related infarct, pain control.  Rule out sepsis and discuss with GI for cirrhosis follow up tomorrow. Consults called: None overnight Admission status: OBS At the point of initial evaluation, it is my clinical opinion that admission for OBSERVATION is reasonable and necessary because the patient's presenting complaints in the context of their chronic conditions represent sufficient risk of deterioration or significant morbidity to constitute reasonable grounds for close observation in the hospital setting, but that the patient may be medically stable for discharge from the hospital within 24 to 48 hours.    Medical decision making: Patient seen at 9:05 PM on 06/21/2016.  The patient was discussed with Jackson Latino, PA-C and Gen Surg by phone, who reviewed imaging but were not asked to evaluate the patient.  What exists of the patient's chart was reviewed in depth and summarized above.  Clinical condition: stable.        Edwin Dada [facility] Pager [phone]

## 2016-06-21 NOTE — ED Triage Notes (Signed)
Patient here with generalized abdominal pain with distention and bloating x 1 week. No nausea, no vomiting, denies diarrhea. Reports that her blood sugar has been running high the past few days. Alert and oriented

## 2016-06-22 DIAGNOSIS — N10 Acute tubulo-interstitial nephritis: Secondary | ICD-10-CM

## 2016-06-22 DIAGNOSIS — I8510 Secondary esophageal varices without bleeding: Secondary | ICD-10-CM

## 2016-06-22 DIAGNOSIS — D735 Infarction of spleen: Principal | ICD-10-CM

## 2016-06-22 DIAGNOSIS — R188 Other ascites: Secondary | ICD-10-CM

## 2016-06-22 DIAGNOSIS — K7460 Unspecified cirrhosis of liver: Secondary | ICD-10-CM

## 2016-06-22 DIAGNOSIS — E876 Hypokalemia: Secondary | ICD-10-CM

## 2016-06-22 DIAGNOSIS — K7469 Other cirrhosis of liver: Secondary | ICD-10-CM

## 2016-06-22 LAB — COMPREHENSIVE METABOLIC PANEL
ALK PHOS: 187 U/L — AB (ref 38–126)
ALT: 27 U/L (ref 14–54)
ANION GAP: 10 (ref 5–15)
AST: 37 U/L (ref 15–41)
Albumin: 1.8 g/dL — ABNORMAL LOW (ref 3.5–5.0)
BILIRUBIN TOTAL: 3.4 mg/dL — AB (ref 0.3–1.2)
BUN: 11 mg/dL (ref 6–20)
CALCIUM: 7.6 mg/dL — AB (ref 8.9–10.3)
CO2: 22 mmol/L (ref 22–32)
Chloride: 104 mmol/L (ref 101–111)
Creatinine, Ser: 1.18 mg/dL — ABNORMAL HIGH (ref 0.44–1.00)
GFR, EST NON AFRICAN AMERICAN: 52 mL/min — AB (ref >60)
Glucose, Bld: 240 mg/dL — ABNORMAL HIGH (ref 65–99)
Potassium: 3.6 mmol/L (ref 3.5–5.1)
Sodium: 136 mmol/L (ref 135–145)
TOTAL PROTEIN: 6.5 g/dL (ref 6.5–8.1)

## 2016-06-22 LAB — IRON AND TIBC
IRON: 30 ug/dL (ref 28–170)
SATURATION RATIOS: 16 % (ref 10.4–31.8)
TIBC: 182 ug/dL — ABNORMAL LOW (ref 250–450)
UIBC: 152 ug/dL

## 2016-06-22 LAB — URINALYSIS, ROUTINE W REFLEX MICROSCOPIC
Glucose, UA: 250 mg/dL — AB
KETONES UR: NEGATIVE mg/dL
Nitrite: NEGATIVE
PH: 6 (ref 5.0–8.0)
Protein, ur: NEGATIVE mg/dL
SPECIFIC GRAVITY, URINE: 1.016 (ref 1.005–1.030)

## 2016-06-22 LAB — CBC
HCT: 35 % — ABNORMAL LOW (ref 36.0–46.0)
HEMOGLOBIN: 13 g/dL (ref 12.0–15.0)
MCH: 32.7 pg (ref 26.0–34.0)
MCHC: 36.8 g/dL — AB (ref 30.0–36.0)
MCV: 88.6 fL (ref 78.0–100.0)
Platelets: 94 10*3/uL — ABNORMAL LOW (ref 150–400)
RBC: 3.95 MIL/uL (ref 3.87–5.11)
RDW: 13.9 % (ref 11.5–15.5)
WBC: 16.6 10*3/uL — AB (ref 4.0–10.5)

## 2016-06-22 LAB — RETICULOCYTES
RBC.: 3.96 MIL/uL (ref 3.87–5.11)
RETIC COUNT ABSOLUTE: 99 10*3/uL (ref 19.0–186.0)
RETIC CT PCT: 2.5 % (ref 0.4–3.1)

## 2016-06-22 LAB — URINE MICROSCOPIC-ADD ON

## 2016-06-22 LAB — GLUCOSE, CAPILLARY
GLUCOSE-CAPILLARY: 211 mg/dL — AB (ref 65–99)
GLUCOSE-CAPILLARY: 246 mg/dL — AB (ref 65–99)
GLUCOSE-CAPILLARY: 218 mg/dL — AB (ref 65–99)
Glucose-Capillary: 97 mg/dL (ref 65–99)
Glucose-Capillary: 315 mg/dL — ABNORMAL HIGH (ref 65–99)

## 2016-06-22 LAB — LACTIC ACID, PLASMA: LACTIC ACID, VENOUS: 1.9 mmol/L (ref 0.5–1.9)

## 2016-06-22 LAB — VITAMIN B12: Vitamin B-12: 7308 pg/mL — ABNORMAL HIGH (ref 180–914)

## 2016-06-22 LAB — FOLATE: FOLATE: 16.5 ng/mL (ref >5.9)

## 2016-06-22 LAB — LACTATE DEHYDROGENASE: LDH: 225 U/L — AB (ref 98–192)

## 2016-06-22 LAB — FERRITIN: FERRITIN: 490 ng/mL — AB (ref 11–307)

## 2016-06-22 LAB — BILIRUBIN, DIRECT: Bilirubin, Direct: 2 mg/dL — ABNORMAL HIGH (ref 0.1–0.5)

## 2016-06-22 MED ORDER — NADOLOL 20 MG PO TABS
20.0000 mg | ORAL_TABLET | Freq: Every day | ORAL | Status: DC
  Administered 2016-06-22 – 2016-06-23 (×2): 20 mg via ORAL
  Filled 2016-06-22 (×2): qty 1

## 2016-06-22 NOTE — Progress Notes (Signed)
Pt lactic acid 2.7 on MD was paged called back on phone that he will put an order in for lab to repeat lactic acid level in the morning, will continue to monitor

## 2016-06-22 NOTE — Progress Notes (Signed)
CRITICAL VALUE ALERT  Critical value received:  Lactic acid 2.7  Date of notification:  06/21/2016  Time of notification:  2347  Critical value read back: yes  Nurse who received alert:  Bing Plume  MD notified (1st page):  Hijazi  Time of first page:  2350  MD notified (2nd page): N/A  Time of second page: 2352  Responding MD: N/A  Time MD responded: N/A

## 2016-06-22 NOTE — Progress Notes (Signed)
[facility]                                                                              Patient Demographics  Deborah Pham, is a 50 y.o. female, DOB - 1966-01-25, PZW:[id number]  Admit date - 06/21/2016   Admitting Physician Edwin Dada, MD  Outpatient Primary MD for the patient is Thurman Coyer, MD  Outpatient specialists:   LOS - 0  days    Chief Complaint  Patient presents with  . Abdominal Pain       Brief summary   Patient is a 50 y.o. female with a past medical history significant for NIDDM, obesity and NASH who presents with 1 week LUQ abdominal pain, worse ton the day of admission. The patient was in her usual state of health until about one week ago, she was getting ready for bed, when she had sudden onset of noticing some upper abdominal pain, primarily left upper quadrant. Over the next week this was intermittent, worse with movement, but mild to moderate in intensity only, associated with feeling bloated. Workup showed WBCs 15.1 and albumin 2.1, total bilirubin 3.5, thrombocytopenia, UA with positive nitrites and leukocytes. CT abdomen showed splenic infarct with marked hypersplenism, cirrhosis and varices.   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    - will obtain iron profile to rule out any storage disease, direct Bi for further workup. No hemolytic component on CBC. I had also discussed with hem-onc, Dr Alvy Bimler, who recommended GI workup first and no hematological issues causing splenic infarct at this time. - Doubt hypercoagulability causing chronic infarct in the setting of liver disease.  - GI consulted, will possibly need liver  biopsy for further workup, ?NASH vs storage disease. Patient denies any alcohol use    - Continue pain control   UTI, possible sepsis:  Repeat UA and cultures, for now continue IV Rocephin  Lactic acidosis Continue gentle hydration  Thrombocytopenia in the setting of liver cirrhosis, portal hypertension - Follow closely  Non-insulin-dependent diabetes mellitus -Hold metformin -SSI while in hospital - Follow hemoglobin A1c  Code Status: Full CODE STATUS  DVT Prophylaxis:  Lovenox  Family Communication: Discussed in detail with the patient, all imaging results, lab results explained to the patient and husband at bed side  Disposition Plan:   Time Spent in minutes   25 minutes  Procedures:  CT abd  Consultants:   GI   Antimicrobials:      Medications  Scheduled Meds: . cefTRIAXone (ROCEPHIN)  IV  1 g Intravenous Q24H  . enoxaparin (LOVENOX) injection  40 mg Subcutaneous Q24H  . insulin aspart  0-15 Units Subcutaneous TID WC  . insulin aspart  0-5 Units Subcutaneous QHS  . sodium chloride flush  3 mL Intravenous Q12H   Continuous Infusions:  PRN Meds:.acetaminophen **OR** acetaminophen, ondansetron **OR** ondansetron (ZOFRAN) IV, oxyCODONE, senna-docusate   Antibiotics   Anti-infectives  Start     Dose/Rate Route Frequency Ordered Stop   06/22/16 1900  cefTRIAXone (ROCEPHIN) 1 g in dextrose 5 % 50 mL IVPB     1 g 100 mL/hr over 30 Minutes Intravenous Every 24 hours 06/21/16 2257     06/21/16 1900  cefTRIAXone (ROCEPHIN) 1 g in dextrose 5 % 50 mL IVPB     1 g 100 mL/hr over 30 Minutes Intravenous  Once 06/21/16 1852 06/21/16 2235        Subjective:   Deborah Pham was seen and examined today. States that she feels a lot better today, abdominal pain and distention is improving. Overnight temp of 10 73F.  denies any chest pain or shortness of breath, coughing,new weakness, numbess, tingling.  Objective:   Vitals:   06/21/16 2252 06/21/16  2258 06/22/16 0102 06/22/16 0504  BP:  132/66  (!) 107/57  Pulse:  (!) 109  (!) 102  Resp:  18  18  Temp:  (!) 101 F (38.3 C) 99.2 F (37.3 C) 98.2 F (36.8 C)  TempSrc:  Oral Oral Oral  SpO2:  95%  93%  Weight: 93.8 kg (206 lb 12.7 oz)       Intake/Output Summary (Last 24 hours) at 06/22/16 1126 Last data filed at 06/22/16 0534  Gross per 24 hour  Intake             1470 ml  Output                0 ml  Net             1470 ml     Wt Readings from Last 3 Encounters:  06/21/16 93.8 kg (206 lb 12.7 oz)     Exam  General: Alert and oriented x 3, NAD  HEENT:  PERRLA, EOMI, slight icteric Sclera  Neck: Supple, no JVD, no masses  Cardiovascular: S1 S2 auscultated, no rubs, murmurs or gallops. Regular rate and rhythm.  Respiratory: Clear to auscultation bilaterally, no wheezing, rales or rhonchi  Gastrointestinal: Soft, nontender, nondistended, + bowel sounds  Ext: no cyanosis clubbing or edema  Neuro: AAOx3, Cr N's II- XII. Strength 5/5 upper and lower extremities bilaterally  Skin: No rashes  Psych: Normal affect and demeanor, alert and oriented x3    Data Reviewed:  I have personally reviewed following labs and imaging studies  Micro Results No results found for this or any previous visit (from the past 240 hour(s)).  Radiology Reports US Abdomen Complete  Result Date: 06/21/2016 CLINICAL DATA:  Increased bilirubin and abdomen pain for 1 week. EXAM: ABDOMEN ULTRASOUND COMPLETE COMPARISON:  None. FINDINGS: Gallbladder: No gallstones or wall thickening visualized. No sonographic Murphy sign noted by sonographer. Common bile duct: Diameter: 3.7 mm. Liver: No focal lesion identified. There is diffuse increased echotexture of the liver with nodular contour of the liver border. IVC: No abnormality visualized. Pancreas: Limited visualization. Spleen: Measures 15.7 cm with volume of 987.1 cm 3. 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 Right Kidney: Length: 12.5 cm. Echogenicity  within normal limits. No mass or hydronephrosis visualized. Left Kidney: Length: 12.8 cm. Echogenicity within normal limits. No mass or hydronephrosis visualized. Abdominal aorta: No aneurysm visualized. Other findings: None. 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 Electronically Signed   By: Abelardo Diesel M.D.   On: 06/21/2016 18:30   Ct Abdomen Pelvis W Contrast  Result Date: 06/21/2016 CLINICAL DATA:  Abdominal pain EXAM: CT ABDOMEN AND PELVIS WITH CONTRAST TECHNIQUE: Multidetector CT  imaging of the abdomen and pelvis was performed using the standard protocol following bolus administration of intravenous contrast. CONTRAST:  172m ISOVUE-300 IOPAMIDOL (ISOVUE-300) INJECTION 61% COMPARISON:  Same day ultrasound of the abdomen FINDINGS: Lower chest: There is cardiomegaly without pericardial effusion. Small hiatal hernia is present. Streaky bibasilar atelectasis left greater than right with trace effusions. No pneumothorax. Hepatobiliary: Hepatomegaly with nodular liver surface. The left lobe of the liver drapes over the enlarged appearing spleen. The enlarged spleen. Pancreas: Unremarkable. No pancreatic ductal dilatation or surrounding inflammatory changes. Spleen: The spleen is enlarged and measures 14.6 cm AP x 6.6 cm transverse at the splenic hilum. There are triangular areas of hypodensity within this between, the largest measuring approximately 2.2 cm, series 201 image 15 extending to the lateral surface of the spleen. Small amount of fluid is noted medially adjacent to the upper pole of the spleen. Differential possibilities may include splenic laceration from splenomegaly or more likely splenic infarct given similar findings in the kidneys. No acute hemorrhage identified. Adrenals/Urinary Tract: Adrenal glands are unremarkable. Inhomogeneous wedge-shaped shaped areas of hypoperfusion involving both kidneys. Left interpolar medial hypodensity consistent with a  cyst measuring approximately 18 mm on coronal reformats. Mild bilateral nephromegaly. Bladder is unremarkable. Stomach/Bowel: The stomach is contracted in appearance. The normal bowel rotation. No inflammation or bowel obstruction. Scattered colonic diverticulosis more so along the sigmoid colon. Appendix is not definitively identified. Vascular/Lymphatic: Paraesophageal, epigastric and splenic varices are noted with splenorenal shunting. No portal or splenic vein thrombosis. Small para-aortic lymph nodes. Reproductive: Uterus and bilateral adnexa are unremarkable. Other: Small amount of ascites overlying the liver, seen within the pelvis and with edema along the left paracolic gutter. Musculoskeletal: No acute osseous abnormality. Osteophytes along the anterior aspect of the visualized lower thoracic spine. L4-5 and L5-S1 facet hypertrophy and sclerosis. IMPRESSION: Hepatosplenomegaly with cirrhotic appearing liver, small volume ascites and numerous varices as above described. Small hypodense triangular shaped abnormality involving the upper pole of the spleen which likely represents a splenic infarct given similar wedge-shaped areas of hypoperfusion of both kidneys. Subacute splenic laceration as it extends to the surface of the spleen cannot be entirely excluded in light of trace fluid medially in the upper pole, but favor infarcts. Electronically Signed   By: DAshley RoyaltyM.D.   On: 06/21/2016 20:11    Lab Data:  CBC:  Recent Labs Lab 06/21/16 1416 06/22/16 0533  WBC 15.1* 16.6*  HGB 14.6 13.0  HCT 38.8 35.0*  MCV 88.2 88.6  PLT 106* 94*   Basic Metabolic Panel:  Recent Labs Lab 06/21/16 1416 06/22/16 0533  NA 132* 136  K 3.1* 3.6  CL 99* 104  CO2 23 22  GLUCOSE 369* 240*  BUN 13 11  CREATININE 1.27* 1.18*  CALCIUM 8.0* 7.6*   GFR: CrCl cannot be calculated (Unknown ideal weight.). Liver Function Tests:  Recent Labs Lab 06/21/16 1416 06/22/16 0533  AST 47* 37  ALT 30 27    ALKPHOS 225* 187*  BILITOT 3.5* 3.4*  PROT 7.1 6.5  ALBUMIN 2.1* 1.8*    Recent Labs Lab 06/21/16 1416  LIPASE 95*   No results for input(s): AMMONIA in the last 168 hours. Coagulation Profile:  Recent Labs Lab 06/21/16 2245  INR 1.29   Cardiac Enzymes: No results for input(s): CKTOTAL, CKMB, CKMBINDEX, TROPONINI in the last 168 hours. BNP (last 3 results) No results for input(s): PROBNP in the last 8760 hours. HbA1C: No results for input(s): HGBA1C in the last 72 hours.  CBG:  Recent Labs Lab 06/21/16 1412 06/21/16 2307 06/21/16 2333 06/22/16 0755  GLUCAP 355* 197* 202* 97   Lipid Profile: No results for input(s): CHOL, HDL, LDLCALC, TRIG, CHOLHDL, LDLDIRECT in the last 72 hours. Thyroid Function Tests: No results for input(s): TSH, T4TOTAL, FREET4, T3FREE, THYROIDAB in the last 72 hours. Anemia Panel:  Recent Labs  06/22/16 0751  RETICCTPCT 2.5   Urine analysis:    Component Value Date/Time   COLORURINE AMBER (A) 06/21/2016 1415   APPEARANCEUR CLOUDY (A) 06/21/2016 1415   LABSPEC 1.016 06/21/2016 1415   PHURINE 6.0 06/21/2016 1415   GLUCOSEU 500 (A) 06/21/2016 1415   HGBUR LARGE (A) 06/21/2016 1415   BILIRUBINUR SMALL (A) 06/21/2016 1415   KETONESUR NEGATIVE 06/21/2016 1415   PROTEINUR 30 (A) 06/21/2016 1415   NITRITE POSITIVE (A) 06/21/2016 1415   LEUKOCYTESUR LARGE (A) 06/21/2016 1415     Righteous Claiborne M.D. [facility] 06/22/2016, 11:26 AM  Pager: [phone] Between 7am to 7pm - call Pager - [phone]  After 7pm go to [email or web address] - password [email or web address]  Call night coverage person covering after 7pm

## 2016-06-22 NOTE — Consult Note (Signed)
[facility] Consult: 10:06 AM 06/22/2016  LOS: 0 days    Referring Provider: Dr Tana Coast  Primary Care Physician:  Thurman Coyer, MD Primary Gastroenterologist:  Idalia Needle     Reason for Consultation:  New diagnosis of cirrhosis with complications.   HPI: Deborah Pham is a 50 y.o. female.  PMH DM 2. Obesity. Karlene Lineman.  Came to the emergency room with 1 week of diffuse abdominal pain. No nausea or vomiting. In general her appetite is not great but in the last week it is significantly decreased.  Symptoms started last Thursday. She used for Aleve daily and sometimes Tylenol. The pain got a little bit better but then it came back yesterday, worse and she went to the emergency room. Doesn't drink alcohol or use illicit drugs. No unusual bleeding. CT scan abdomen pelvis shows splenic infarct versus splenic laceration., Hepatosplenomegaly, cirrhosis. Paraesophageal, epigastric, splenic varices with splenorenal shunting.  There is also regions of hypoperfusion in both kidneys and bilateral nephromegaly. Scattered colonic diverticulosis. White blood cell count 16.6. Hemoglobin 13. Platelets low at 94. Audelia Hives are elevated. Transaminases are normal.  Albumin is low.   Patient's father had alcoholic liver disease and died with this in his mid 4s. Patient has never had consultation with GI physicians and has never undergone any endoscopic procedures.  Past Medical History:  Diagnosis Date  . Diabetes mellitus without complication ([place])   . NASH (nonalcoholic steatohepatitis)     History reviewed. No pertinent surgical history.  Prior to Admission medications   Medication Sig Start Date End Date Taking? Authorizing Provider  metFORMIN (GLUCOPHAGE-XR) 500 MG 24 hr tablet Take 500 mg by mouth daily with  breakfast.   Yes Historical Provider, MD    Scheduled Meds: . cefTRIAXone (ROCEPHIN)  IV  1 g Intravenous Q24H  . enoxaparin (LOVENOX) injection  40 mg Subcutaneous Q24H  . insulin aspart  0-15 Units Subcutaneous TID WC  . insulin aspart  0-5 Units Subcutaneous QHS  . sodium chloride flush  3 mL Intravenous Q12H   Infusions:   PRN Meds: acetaminophen **OR** acetaminophen, ondansetron **OR** ondansetron (ZOFRAN) IV, oxyCODONE, senna-docusate   Allergies as of 06/21/2016  . (No Known Allergies)    Family History  Problem Relation Age of Onset  . Liver disease Father   . Alcoholism Father     Social History   Social History  . Marital status: Married    Spouse name: N/A  . Number of children: N/A  . Years of education: N/A   Occupational History  . Not on file.   Social History Main Topics  . Smoking status: Never Smoker  . Smokeless tobacco: Never Used  . Alcohol use No  . Drug use: Unknown  . Sexual activity: Not on file   Other Topics Concern  . Not on file   Social History Narrative  . No narrative on file    REVIEW OF SYSTEMS: Constitutional:  Patient denies fatigue, weakness. She is able to perform house cleaning, cooking. No problems climbing stairs. ENT:  No nose bleeds Pulm:  No shortness of breath or cough. CV:  No palpitations, no LE edema. No chest pain GU:  No hematuria, no frequency.  GI:  Per hpi.  No dysphagia. No heartburn. No nausea vomiting. No dark or bloody bowel movements. Heme:  No previous issues with anemia or need for iron/B12/folate supplementation.   Transfusions:  None Neuro:  No headaches, no peripheral tingling or numbness Derm:  No itching, no rash or sores.  Endocrine:  No sweats or chills.  No polyuria or dysuria.  For about 3 days prior to admit her sugars are reaching into the 300s which is unusually high for her. Immunization:  Did not inquire. Travel:  None beyond local counties in last few months.    PHYSICAL  EXAM: Vital signs in last 24 hours: Vitals:   06/22/16 0102 06/22/16 0504  BP:  (!) 107/57  Pulse:  (!) 102  Resp:  18  Temp: 99.2 F (37.3 C) 98.2 F (36.8 C)   Wt Readings from Last 3 Encounters:  06/21/16 93.8 kg (206 lb 12.7 oz)    General: Obese, comfortable, fully alert. Head:  Cushingoid type faces.  No facial asymmetry.  Eyes:  Slight scleral icterus. No conjunctival pallor. Ears:  Not hard of hearing.  Nose:  No discharge or congestion Mouth:  His membranes are moist and clear. Fair dentition. Neck:  No thyromegaly, masses, JVD. Lungs:  No labored breathing or cough. Lungs clear bilaterally with good breath sounds Heart: RRR. No MRG. Abdomen:  Obese. Soft. Nontender. Not distended. Bowel sounds active. Unable to appreciate HSM or masses..   Rectal: Deferred   Musc/Skeltl: No joint deformities, swelling or erythema. Extremities:  No CCE.  Neurologic:  Alert. Oriented 3. No tremor. No asterixis. No limb weakness. No gross neurologic deficits. Skin:  Sallow/jaundiced. Tattoos:  None seen   Psych:  Calm, cooperative. Not depressed or anxious.  Intake/Output from previous day: 10/26 0701 - 10/27 0700 In: 3875 [P.O.:420; IV Piggyback:1050] Out: -  Intake/Output this shift: No intake/output data recorded.  LAB RESULTS:  Recent Labs  06/21/16 1416 06/22/16 0533  WBC 15.1* 16.6*  HGB 14.6 13.0  HCT 38.8 35.0*  PLT 106* 94*   BMET Lab Results  Component Value Date   NA 136 06/22/2016   NA 132 (L) 06/21/2016   NA 139 11/15/2008   K 3.6 06/22/2016   K 3.1 (L) 06/21/2016   K 3.6 11/15/2008   CL 104 06/22/2016   CL 99 (L) 06/21/2016   CL 102 11/15/2008   CO2 22 06/22/2016   CO2 23 06/21/2016   CO2 26 11/15/2008   GLUCOSE 240 (H) 06/22/2016   GLUCOSE 369 (H) 06/21/2016   GLUCOSE 112 (H) 11/15/2008   BUN 11 06/22/2016   BUN 13 06/21/2016   BUN 11 11/15/2008   CREATININE 1.18 (H) 06/22/2016   CREATININE 1.27 (H) 06/21/2016   CREATININE 0.70  11/15/2008   CALCIUM 7.6 (L) 06/22/2016   CALCIUM 8.0 (L) 06/21/2016   CALCIUM 8.9 11/15/2008   LFT  Recent Labs  06/21/16 1416 06/22/16 0533 06/22/16 0918  PROT 7.1 6.5  --   ALBUMIN 2.1* 1.8*  --   AST 47* 37  --   ALT 30 27  --   ALKPHOS 225* 187*  --   BILITOT 3.5* 3.4*  --   BILIDIR  --   --  2.0*   PT/INR Lab Results  Component Value Date   INR 1.29 06/21/2016   Hepatitis Panel No results for input(s):  HEPBSAG, HCVAB, HEPAIGM, HEPBIGM in the last 72 hours. C-Diff No components found for: CDIFF Lipase     Component Value Date/Time   LIPASE 95 (H) 06/21/2016 1416    Drugs of Abuse  No results found for: LABOPIA, COCAINSCRNUR, LABBENZ, AMPHETMU, THCU, LABBARB   RADIOLOGY STUDIES: US Abdomen Complete  Result Date: 06/21/2016 CLINICAL DATA:  Increased bilirubin and abdomen pain for 1 week. EXAM: ABDOMEN ULTRASOUND COMPLETE COMPARISON:  None. FINDINGS: Gallbladder: No gallstones or wall thickening visualized. No sonographic Murphy sign noted by sonographer. Common bile duct: Diameter: 3.7 mm. Liver: No focal lesion identified. There is diffuse increased echotexture of the liver with nodular contour of the liver border. IVC: No abnormality visualized. Pancreas: Limited visualization. Spleen: Measures 15.7 cm with volume of 987.1 cm 3. 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 Right Kidney: Length: 12.5 cm. Echogenicity within normal limits. No mass or hydronephrosis visualized. Left Kidney: Length: 12.8 cm. Echogenicity within normal limits. No mass or hydronephrosis visualized. Abdominal aorta: No aneurysm visualized. Other findings: None. 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 Electronically Signed   By: Abelardo Diesel M.D.   On: 06/21/2016 18:30   Ct Abdomen Pelvis W Contrast  Result Date: 06/21/2016 CLINICAL DATA:  Abdominal pain EXAM: CT ABDOMEN AND PELVIS WITH CONTRAST TECHNIQUE: Multidetector CT imaging of the abdomen and pelvis  was performed using the standard protocol following bolus administration of intravenous contrast. CONTRAST:  156m ISOVUE-300 IOPAMIDOL (ISOVUE-300) INJECTION 61% COMPARISON:  Same day ultrasound of the abdomen FINDINGS: Lower chest: There is cardiomegaly without pericardial effusion. Small hiatal hernia is present. Streaky bibasilar atelectasis left greater than right with trace effusions. No pneumothorax. Hepatobiliary: Hepatomegaly with nodular liver surface. The left lobe of the liver drapes over the enlarged appearing spleen. The enlarged spleen. Pancreas: Unremarkable. No pancreatic ductal dilatation or surrounding inflammatory changes. Spleen: The spleen is enlarged and measures 14.6 cm AP x 6.6 cm transverse at the splenic hilum. There are triangular areas of hypodensity within this between, the largest measuring approximately 2.2 cm, series 201 image 15 extending to the lateral surface of the spleen. Small amount of fluid is noted medially adjacent to the upper pole of the spleen. Differential possibilities may include splenic laceration from splenomegaly or more likely splenic infarct given similar findings in the kidneys. No acute hemorrhage identified. Adrenals/Urinary Tract: Adrenal glands are unremarkable. Inhomogeneous wedge-shaped shaped areas of hypoperfusion involving both kidneys. Left interpolar medial hypodensity consistent with a cyst measuring approximately 18 mm on coronal reformats. Mild bilateral nephromegaly. Bladder is unremarkable. Stomach/Bowel: The stomach is contracted in appearance. The normal bowel rotation. No inflammation or bowel obstruction. Scattered colonic diverticulosis more so along the sigmoid colon. Appendix is not definitively identified. Vascular/Lymphatic: Paraesophageal, epigastric and splenic varices are noted with splenorenal shunting. No portal or splenic vein thrombosis. Small para-aortic lymph nodes. Reproductive: Uterus and bilateral adnexa are unremarkable.  Other: Small amount of ascites overlying the liver, seen within the pelvis and with edema along the left paracolic gutter. Musculoskeletal: No acute osseous abnormality. Osteophytes along the anterior aspect of the visualized lower thoracic spine. L4-5 and L5-S1 facet hypertrophy and sclerosis. IMPRESSION: Hepatosplenomegaly with cirrhotic appearing liver, small volume ascites and numerous varices as above described. Small hypodense triangular shaped abnormality involving the upper pole of the spleen which likely represents a splenic infarct given similar wedge-shaped areas of hypoperfusion of both kidneys. Subacute splenic laceration as it extends to the surface of the spleen cannot be entirely excluded in light of trace fluid  medially in the upper pole, but favor infarcts. Electronically Signed   By: Ashley Royalty M.D.   On: 06/21/2016 20:11     IMPRESSION:   *  New diagnosis of cirrhosis in patient with obesity and history NASH.  *  Hypersplenism and splenic infarct.  *  Paraesophageal, epigastric, splenic varices.  *  Mild ascites.  *  Thrombocytopenia.  *  DM 2.    *  Probable UTI, though urinary specimen did have significant amount of squamous epithelial cells so not an ideal specimen.  She is getting Rocephin   PLAN:     *  Per Dr Loletha Carrow.    *  She will need upper endoscopy to evaluate the varices.    Azucena Freed  06/22/2016, 10:06 AM   I have reviewed the entire case in detail with the above APP and discussed the plan in detail.  Therefore, I agree with the diagnoses recorded above. In addition,  I have personally interviewed and examined the patient and have personally reviewed any abdominal/pelvic CT scan images.  My additional thoughts are as follows: Interviewed with Technical brewer and husband present at bedside.  ESLD, most likely from long-standing fatty liver  MELD 17  I am not planning to do her EGD as an inpatient.  I gave her my card so they can  follow up in the office.   Given imaging findings, start nadolol 20 mg once daily for suspected esophageal varices.  Splenic infarct is managed conservatively with pain control  Check hepatitis serologies if not already done  Can go home anytime from a GI perspective.  [name] Pager [phone]  Mon-Fri 8a-5p [phone] after 5p, weekends, holidays  Pager: [phone]

## 2016-06-23 LAB — COMPREHENSIVE METABOLIC PANEL
ALBUMIN: 1.7 g/dL — AB (ref 3.5–5.0)
ALT: 24 U/L (ref 14–54)
ANION GAP: 9 (ref 5–15)
AST: 46 U/L — ABNORMAL HIGH (ref 15–41)
Alkaline Phosphatase: 182 U/L — ABNORMAL HIGH (ref 38–126)
BILIRUBIN TOTAL: 3.5 mg/dL — AB (ref 0.3–1.2)
BUN: 11 mg/dL (ref 6–20)
CHLORIDE: 103 mmol/L (ref 101–111)
CO2: 21 mmol/L — ABNORMAL LOW (ref 22–32)
Calcium: 7.6 mg/dL — ABNORMAL LOW (ref 8.9–10.3)
Creatinine, Ser: 0.97 mg/dL (ref 0.44–1.00)
GFR calc Af Amer: >60 mL/min (ref >60)
GLUCOSE: 218 mg/dL — AB (ref 65–99)
POTASSIUM: 3.4 mmol/L — AB (ref 3.5–5.1)
Sodium: 133 mmol/L — ABNORMAL LOW (ref 135–145)
TOTAL PROTEIN: 6.7 g/dL (ref 6.5–8.1)

## 2016-06-23 LAB — HEPATITIS B SURFACE ANTIGEN: Hepatitis B Surface Ag: NEGATIVE

## 2016-06-23 LAB — HEMOGLOBIN A1C
HEMOGLOBIN A1C: 8.9 % — AB (ref 4.8–5.6)
MEAN PLASMA GLUCOSE: 209 mg/dL

## 2016-06-23 LAB — HAPTOGLOBIN: HAPTOGLOBIN: 109 mg/dL (ref 34–200)

## 2016-06-23 LAB — CBC
HEMATOCRIT: 38.3 % (ref 36.0–46.0)
Hemoglobin: 14 g/dL (ref 12.0–15.0)
MCH: 32.4 pg (ref 26.0–34.0)
MCHC: 36.6 g/dL — AB (ref 30.0–36.0)
MCV: 88.7 fL (ref 78.0–100.0)
PLATELETS: 127 10*3/uL — AB (ref 150–400)
RBC: 4.32 MIL/uL (ref 3.87–5.11)
RDW: 14 % (ref 11.5–15.5)
WBC: 12.6 10*3/uL — AB (ref 4.0–10.5)

## 2016-06-23 LAB — URINE CULTURE

## 2016-06-23 LAB — HEPATITIS A ANTIBODY, TOTAL: HEP A TOTAL AB: POSITIVE — AB

## 2016-06-23 LAB — GLUCOSE, CAPILLARY: Glucose-Capillary: 192 mg/dL — ABNORMAL HIGH (ref 65–99)

## 2016-06-23 LAB — HEPATITIS B CORE ANTIBODY, TOTAL: HEP B C TOTAL AB: NEGATIVE

## 2016-06-23 MED ORDER — CEPHALEXIN 500 MG PO CAPS
500.0000 mg | ORAL_CAPSULE | Freq: Two times a day (BID) | ORAL | Status: DC
  Administered 2016-06-23: 500 mg via ORAL
  Filled 2016-06-23: qty 1

## 2016-06-23 MED ORDER — NADOLOL 20 MG PO TABS: 20.0000 mg | ORAL_TABLET | Freq: Every day | ORAL | 3 refills | Status: DC

## 2016-06-23 MED ORDER — METFORMIN HCL 1000 MG PO TABS: 1000.0000 mg | ORAL_TABLET | Freq: Two times a day (BID) | ORAL | 3 refills | Status: DC

## 2016-06-23 MED ORDER — CEPHALEXIN 500 MG PO CAPS: 500.0000 mg | ORAL_CAPSULE | Freq: Two times a day (BID) | ORAL | 0 refills | Status: DC

## 2016-06-23 MED ORDER — POTASSIUM CHLORIDE CRYS ER 20 MEQ PO TBCR
40.0000 meq | EXTENDED_RELEASE_TABLET | Freq: Once | ORAL | Status: AC
  Administered 2016-06-23: 40 meq via ORAL
  Filled 2016-06-23: qty 2

## 2016-06-23 NOTE — Discharge Summary (Signed)
Physician Discharge Summary   Patient ID: ENORA TRILLO MRN: [id number] DOB/AGE: 1965-11-30 50 y.o.  Admit date: 06/21/2016 Discharge date: 06/23/2016  Primary Care Physician:  Thurman Coyer, MD  Discharge Diagnoses:    . Splenic infarction . UTI . Hypokalemia .  cirrhosis of liver (HCC) likely due to [facility] Diabetes mellitus Portal hypertension with varices   Consults:  Gastroenterology, Dr. Loletha Carrow  Recommendations for Outpatient Follow-up:  1. Please repeat CBC/BMET at next visit  DIET: Carb modified diet    Allergies:  No Known Allergies   DISCHARGE MEDICATIONS: Current Discharge Medication List    START taking these medications   Details  cephALEXin (KEFLEX) 500 MG capsule Take 1 capsule (500 mg total) by mouth 2 (two) times daily. X 3 days Qty: 6 capsule, Refills: 0    metFORMIN (GLUCOPHAGE) 1000 MG tablet Take 1 tablet (1,000 mg total) by mouth 2 (two) times daily with a meal. Qty: 60 tablet, Refills: 3    nadolol (CORGARD) 20 MG tablet Take 1 tablet (20 mg total) by mouth daily. Qty: 30 tablet, Refills: 3      STOP taking these medications     metFORMIN (GLUCOPHAGE-XR) 500 MG 24 hr tablet          Brief H and P: For complete details please refer to admission H and P, but in briefPatient is a 50 y.o.femalewith a past medical history significant for NIDDM, obesity and NASHwho presents with 1 week LUQ abdominal pain, worse ton the day of admission. The patient was in her usual state of health until about one week ago, she was getting ready for bed, when she had sudden onset of noticing some upper abdominal pain, primarily left upper quadrant. Over the next week this was intermittent, worse with movement, but mild to moderate in intensity only, associated with feeling bloated. Workup showed WBCs 15.1 and albumin 2.1, total bilirubin 3.5, thrombocytopenia, UA with positive nitrites and leukocytes. CT abdomen showed splenic infarct with  marked hypersplenism, cirrhosis and varices.  Hospital Course:   Abdominal pain with Splenic infarct in the setting of liver cirrhosis, portal hypertension, cirrhosis, hypoalbuminemia, thrombocytopenia, hyperbilirubinemia -Abdominal pain has completely resolved -No prior workup of the liver disease, although patient had prior ultrasound in 04/2005 which had shown diffuse fatty infiltration however spleen was normal.   -Iron profile with ferritin 490, B12 7308. Direct bilirubin 2.0. No hemolytic component on CBC. I had also discussed with hem-onc, Dr Alvy Bimler, who recommended GI workup first and no hematological issues causing splenic infarct at this time. - Doubt hypercoagulability causing chronic infarct in the setting of liver disease.  - GI was consulted, patient was seen by Dr. Simona Huh and recommended outpatient follow-up, endoscopy outpatient, start nadolol 20 mg daily. Hepatitis serology showed positive hepatitis C antibody. At this time patient does not have any pain or nausea or vomiting. She is tolerating diet without any difficulty.    UTI Urine culture negative so far, patient received IV Rocephin during hospitalization, placed on Keflex for 3 days.  Lactic acidosis Continue gentle hydration  Thrombocytopenia in the setting of liver cirrhosis, portal hypertension - Follow closely  Non-insulin-dependent diabetes mellitus -Uncontrolled, metformin was held during hospitalization and patient was placed on sliding scale insulin. Hemoglobin A1c is 8.9. She was on metformin 500 mg daily, increase to 1000 mg twice a day.   Day of Discharge BP (!) 120/58 (BP Location: Right Arm)   Pulse 80   Temp 98.5 F (36.9 C)   Resp 20  Ht 4' 11"  (1.499 m)   Wt 93.8 kg (206 lb 12.7 oz)   SpO2 94%   BMI 41.77 kg/m   Physical Exam: General: Alert and awake oriented x3 not in any acute distress. HEENT: anicteric sclera, pupils reactive to light and accommodation CVS: S1-S2 clear no  murmur rubs or gallops Chest: clear to auscultation bilaterally, no wheezing rales or rhonchi Abdomen: soft nontender, nondistended, normal bowel sounds Extremities: no cyanosis, clubbing or edema noted bilaterally Neuro: Cranial nerves II-XII intact, no focal neurological deficits   The results of significant diagnostics from this hospitalization (including imaging, microbiology, ancillary and laboratory) are listed below for reference.    LAB RESULTS: Basic Metabolic Panel:  Recent Labs Lab 06/22/16 0533 06/23/16 0543  NA 136 133*  K 3.6 3.4*  CL 104 103  CO2 22 21*  GLUCOSE 240* 218*  BUN 11 11  CREATININE 1.18* 0.97  CALCIUM 7.6* 7.6*   Liver Function Tests:  Recent Labs Lab 06/22/16 0533 06/23/16 0543  AST 37 46*  ALT 27 24  ALKPHOS 187* 182*  BILITOT 3.4* 3.5*  PROT 6.5 6.7  ALBUMIN 1.8* 1.7*    Recent Labs Lab 06/21/16 1416  LIPASE 95*   No results for input(s): AMMONIA in the last 168 hours. CBC:  Recent Labs Lab 06/22/16 0533 06/23/16 0543  WBC 16.6* 12.6*  HGB 13.0 14.0  HCT 35.0* 38.3  MCV 88.6 88.7  PLT 94* 127*   Cardiac Enzymes: No results for input(s): CKTOTAL, CKMB, CKMBINDEX, TROPONINI in the last 168 hours. BNP: Invalid input(s): POCBNP CBG:  Recent Labs Lab 06/22/16 2047 06/22/16 2307  GLUCAP 246* 218*    Significant Diagnostic Studies:  US Abdomen Complete  Result Date: 06/21/2016 CLINICAL DATA:  Increased bilirubin and abdomen pain for 1 week. EXAM: ABDOMEN ULTRASOUND COMPLETE COMPARISON:  None. FINDINGS: Gallbladder: No gallstones or wall thickening visualized. No sonographic Murphy sign noted by sonographer. Common bile duct: Diameter: 3.7 mm. Liver: No focal lesion identified. There is diffuse increased echotexture of the liver with nodular contour of the liver border. IVC: No abnormality visualized. Pancreas: Limited visualization. Spleen: Measures 15.7 cm with volume of 987.1 cm 3. 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  Right Kidney: Length: 12.5 cm. Echogenicity within normal limits. No mass or hydronephrosis visualized. Left Kidney: Length: 12.8 cm. Echogenicity within normal limits. No mass or hydronephrosis visualized. Abdominal aorta: No aneurysm visualized. Other findings: None. 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 Electronically Signed   By: Abelardo Diesel M.D.   On: 06/21/2016 18:30   Ct Abdomen Pelvis W Contrast  Result Date: 06/21/2016 CLINICAL DATA:  Abdominal pain EXAM: CT ABDOMEN AND PELVIS WITH CONTRAST TECHNIQUE: Multidetector CT imaging of the abdomen and pelvis was performed using the standard protocol following bolus administration of intravenous contrast. CONTRAST:  189m ISOVUE-300 IOPAMIDOL (ISOVUE-300) INJECTION 61% COMPARISON:  Same day ultrasound of the abdomen FINDINGS: Lower chest: There is cardiomegaly without pericardial effusion. Small hiatal hernia is present. Streaky bibasilar atelectasis left greater than right with trace effusions. No pneumothorax. Hepatobiliary: Hepatomegaly with nodular liver surface. The left lobe of the liver drapes over the enlarged appearing spleen. The enlarged spleen. Pancreas: Unremarkable. No pancreatic ductal dilatation or surrounding inflammatory changes. Spleen: The spleen is enlarged and measures 14.6 cm AP x 6.6 cm transverse at the splenic hilum. There are triangular areas of hypodensity within this between, the largest measuring approximately 2.2 cm, series 201 image 15 extending to the lateral surface of the spleen. Small  amount of fluid is noted medially adjacent to the upper pole of the spleen. Differential possibilities may include splenic laceration from splenomegaly or more likely splenic infarct given similar findings in the kidneys. No acute hemorrhage identified. Adrenals/Urinary Tract: Adrenal glands are unremarkable. Inhomogeneous wedge-shaped shaped areas of hypoperfusion involving both kidneys. Left  interpolar medial hypodensity consistent with a cyst measuring approximately 18 mm on coronal reformats. Mild bilateral nephromegaly. Bladder is unremarkable. Stomach/Bowel: The stomach is contracted in appearance. The normal bowel rotation. No inflammation or bowel obstruction. Scattered colonic diverticulosis more so along the sigmoid colon. Appendix is not definitively identified. Vascular/Lymphatic: Paraesophageal, epigastric and splenic varices are noted with splenorenal shunting. No portal or splenic vein thrombosis. Small para-aortic lymph nodes. Reproductive: Uterus and bilateral adnexa are unremarkable. Other: Small amount of ascites overlying the liver, seen within the pelvis and with edema along the left paracolic gutter. Musculoskeletal: No acute osseous abnormality. Osteophytes along the anterior aspect of the visualized lower thoracic spine. L4-5 and L5-S1 facet hypertrophy and sclerosis. IMPRESSION: Hepatosplenomegaly with cirrhotic appearing liver, small volume ascites and numerous varices as above described. Small hypodense triangular shaped abnormality involving the upper pole of the spleen which likely represents a splenic infarct given similar wedge-shaped areas of hypoperfusion of both kidneys. Subacute splenic laceration as it extends to the surface of the spleen cannot be entirely excluded in light of trace fluid medially in the upper pole, but favor infarcts. Electronically Signed   By: Ashley Royalty M.D.   On: 06/21/2016 20:11    2D ECHO:   Disposition and Follow-up: Discharge Instructions    Diet Carb Modified    Complete by:  As directed    Discharge instructions    Complete by:  As directed    It is VERY IMPORTANT that you follow up with a PCP on a regular basis.  Check your blood glucoses before each meal and at bedtime and maintain a log of your readings.  Bring this log with you when you follow up with your PCP so that he or she can adjust your medications at your follow up  visit.  Please note that Metformin is increased to 1000 mg twice a day.   Increase activity slowly    Complete by:  As directed        DISPOSITION: Eden L, MD. Schedule an appointment as soon as possible for a visit in 10 day(s).   Specialty:  Internal Medicine Contact information: [address] [place] [address] [place], [place]. Schedule an appointment as soon as possible for a visit in 2 week(s).   Specialty:  Gastroenterology Why:  for your liver Contact information: [place]  [address] [phone]            Time spent on Discharge: 29mns  Signed:   RAI,RIPUDEEP M.D. [facility] 06/23/2016, 9:52 AM Pager: [phone]

## 2016-06-23 NOTE — Progress Notes (Signed)
Nsg Discharge Note  Admit Date:  06/21/2016 Discharge date: 06/23/2016   Deborah Pham to be D/C'd Home per MD order.  AVS completed.  Copy for chart, and copy for patient signed, and dated. Patient/caregiver able to verbalize understanding.  Discharge Medication:   Medication List    STOP taking these medications   metFORMIN 500 MG 24 hr tablet Commonly known as:  GLUCOPHAGE-XR Replaced by:  metFORMIN 1000 MG tablet     TAKE these medications   cephALEXin 500 MG capsule Commonly known as:  KEFLEX Take 1 capsule (500 mg total) by mouth 2 (two) times daily. X 3 days   metFORMIN 1000 MG tablet Commonly known as:  GLUCOPHAGE Take 1 tablet (1,000 mg total) by mouth 2 (two) times daily with a meal. Replaces:  metFORMIN 500 MG 24 hr tablet   nadolol 20 MG tablet Commonly known as:  CORGARD Take 1 tablet (20 mg total) by mouth daily.       Discharge Assessment: Vitals:   06/22/16 2047 06/23/16 0516  BP: (!) 105/46 (!) 120/58  Pulse: 81 80  Resp: 20 20  Temp: 98.2 F (36.8 C) 98.5 F (36.9 C)   Skin clean, dry and intact without evidence of skin break down, no evidence of skin tears noted. IV catheter discontinued intact. Site without signs and symptoms of complications - no redness or edema noted at insertion site, patient denies c/o pain - only slight tenderness at site.  Dressing with slight pressure applied.  D/c Instructions-Education: Discharge instructions given to patient/family with verbalized understanding. D/c education completed with patient/family including follow up instructions, medication list, d/c activities limitations if indicated, with other d/c instructions as indicated by MD - patient able to verbalize understanding, all questions fully answered. Patient instructed to return to ED, call 911, or call MD for any changes in condition.  Patient escorted via [place], and D/C home via private auto.  Salley Slaughter, RN 06/23/2016 11:34 AM

## 2016-06-24 LAB — URINE CULTURE: Culture: >=100000 — AB

## 2016-06-25 ENCOUNTER — Encounter: Payer: Self-pay | Admitting: Gastroenterology

## 2016-06-26 LAB — CULTURE, BLOOD (ROUTINE X 2): CULTURE: NO GROWTH

## 2016-06-27 LAB — CULTURE, BLOOD (ROUTINE X 2): CULTURE: NO GROWTH

## 2016-08-09 ENCOUNTER — Encounter (HOSPITAL_COMMUNITY): Payer: Self-pay | Admitting: Emergency Medicine

## 2016-08-09 ENCOUNTER — Emergency Department (HOSPITAL_COMMUNITY): Payer: Self-pay

## 2016-08-09 ENCOUNTER — Emergency Department (HOSPITAL_COMMUNITY)
Admission: EM | Admit: 2016-08-09 | Discharge: 2016-08-09 | Disposition: A | Discharge status: Home / Self Care | Payer: Self-pay | Attending: Emergency Medicine | Admitting: Emergency Medicine

## 2016-08-09 DIAGNOSIS — B9789 Other viral agents as the cause of diseases classified elsewhere: Secondary | ICD-10-CM

## 2016-08-09 DIAGNOSIS — E119 Type 2 diabetes mellitus without complications: Secondary | ICD-10-CM | POA: Insufficient documentation

## 2016-08-09 DIAGNOSIS — Z7984 Long term (current) use of oral hypoglycemic drugs: Secondary | ICD-10-CM | POA: Insufficient documentation

## 2016-08-09 DIAGNOSIS — J40 Bronchitis, not specified as acute or chronic: Secondary | ICD-10-CM | POA: Insufficient documentation

## 2016-08-09 DIAGNOSIS — J069 Acute upper respiratory infection, unspecified: Secondary | ICD-10-CM | POA: Insufficient documentation

## 2016-08-09 MED ORDER — GUAIFENESIN-DM 100-10 MG/5ML PO SYRP: 5.0000 mL | ORAL_SOLUTION | ORAL | 0 refills | Status: DC | PRN

## 2016-08-09 MED ORDER — PSEUDOEPHEDRINE HCL 30 MG PO TABS: 30.0000 mg | ORAL_TABLET | Freq: Four times a day (QID) | ORAL | 0 refills | Status: DC | PRN

## 2016-08-09 MED ORDER — BENZONATATE 100 MG PO CAPS: 100.0000 mg | ORAL_CAPSULE | Freq: Three times a day (TID) | ORAL | 0 refills | Status: DC

## 2016-08-09 NOTE — ED Provider Notes (Signed)
[facility] DEPT Provider Note   CSN: [id number] Arrival date & time: 08/09/16  3875     History   Chief Complaint Chief Complaint  Patient presents with  . Otalgia  . Cough    HPI CECILIA Maryem Shuffler is a 50 y.o. female.  HPI CECILIA Pheonix Wisby is a 50 y.o. female with hx of DM, presents to ED with complaint of cough, nasal congestion, soreThroat since yesterday. She reports subjective fever, did not check her temperature at home. She has tried taking Advil for her possible fever, last dose yesterday. She denies taking any for her so throat or cough. She denies any recent ill contacts. States nothing is making her symptoms worse. She reports some associated shortness of breath. Cough is nonproductive. Denies getting her flu shot this year. No nausea, vomiting or diarrhea. No chest pain. No abdominal pain. No other complaints.  Past Medical History:  Diagnosis Date  . Diabetes mellitus without complication ([place])   . NASH (nonalcoholic steatohepatitis)     Patient Active Problem List   Diagnosis Date Noted  . 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 Acute pyelonephritis 06/21/2016  . Hypokalemia 06/21/2016  . Other cirrhosis of liver ([place]) 06/21/2016    History reviewed. No pertinent surgical history.  OB History    No data available       Home Medications    Prior to Admission medications   Medication Sig Start Date End Date Taking? Authorizing Provider  cephALEXin (KEFLEX) 500 MG capsule Take 1 capsule (500 mg total) by mouth 2 (two) times daily. X 3 days 06/23/16   Ripudeep Krystal Eaton, MD  metFORMIN (GLUCOPHAGE) 1000 MG tablet Take 1 tablet (1,000 mg total) by mouth 2 (two) times daily with a meal. 06/23/16   Ripudeep K Rai, MD  nadolol (CORGARD) 20 MG tablet Take 1 tablet (20 mg total) by mouth daily. 06/23/16   Ripudeep Krystal Eaton, MD    Family History Family History  Problem Relation Age of Onset  . Liver disease Father   . Alcoholism Father      Social History Social History  Substance Use Topics  . Smoking status: Never Smoker  . Smokeless tobacco: Never Used  . Alcohol use No     Allergies   Patient has no known allergies.   Review of Systems Review of Systems  Constitutional: Positive for chills and fever.  HENT: Positive for congestion and sore throat.   Respiratory: Positive for cough. Negative for chest tightness and shortness of breath.   Cardiovascular: Negative for chest pain, palpitations and leg swelling.  Gastrointestinal: Negative for abdominal pain, diarrhea, nausea and vomiting.  Genitourinary: Negative for dysuria, flank pain, pelvic pain, vaginal bleeding, vaginal discharge and vaginal pain.  Musculoskeletal: Negative for arthralgias, myalgias, neck pain and neck stiffness.  Skin: Negative for rash.  Neurological: Negative for dizziness, weakness and headaches.  All other systems reviewed and are negative.    Physical Exam Updated Vital Signs BP 130/81 (BP Location: Left Arm)   Pulse 107   Temp 98.6 F (37 C) (Oral)   Resp 17   SpO2 98%   Physical Exam  Constitutional: She appears well-developed and well-nourished. No distress.  HENT:  Head: Normocephalic and atraumatic.  Right Ear: Tympanic membrane, external ear and ear canal normal.  Left Ear: Tympanic membrane, external ear and ear canal normal.  Nose: Mucosal edema and rhinorrhea present.  Mouth/Throat: Uvula is midline, oropharynx is clear and moist and mucous membranes are normal.  Eyes: Conjunctivae  are normal.  Neck: Neck supple.  Cardiovascular: Normal rate, regular rhythm and normal heart sounds.   Pulmonary/Chest: Effort normal and breath sounds normal. No respiratory distress. She has no wheezes. She has no rales.  Abdominal: Soft. Bowel sounds are normal. She exhibits no distension. There is no tenderness. There is no rebound.  Musculoskeletal: She exhibits no edema.  Neurological: She is alert.  Skin: Skin is warm and  dry.  Psychiatric: She has a normal mood and affect. Her behavior is normal.  Nursing note and vitals reviewed.    ED Treatments / Results  Labs (all labs ordered are listed, but only abnormal results are displayed) Labs Reviewed - No data to display  EKG  EKG Interpretation None       Radiology Dg Chest 2 View  Result Date: 08/09/2016 CLINICAL DATA:  Cough and shortness of breath for 2 days. EXAM: CHEST  2 VIEW COMPARISON:  None. FINDINGS: The cardiomediastinal silhouette is unremarkable. Peribronchial thickening is present. There is no evidence of focal airspace disease, pulmonary edema, suspicious pulmonary nodule/mass, pleural effusion, or pneumothorax. No acute bony abnormalities are identified. IMPRESSION: Peribronchial thickening/bronchitis changes of uncertain chronicity. No evidence of focal pneumonia. Electronically Signed   By: Margarette Canada M.D.   On: 08/09/2016 09:08    Procedures Procedures (including critical care time)  Medications Ordered in ED Medications - No data to display   Initial Impression / Assessment and Plan / ED Course  I have reviewed the triage vital signs and the nursing notes.  Pertinent labs & imaging results that were available during my care of the patient were reviewed by me and considered in my medical decision making (see chart for details).  Clinical Course     Patient in emergency department with flulike symptoms. Mildly tachycardic with heart rate 107, otherwise normal vital signs. Afebrile. Did not take any medications this morning. Will check chest x-ray to rule out pneumonia. Exam otherwise unremarkable.   9:44 AM Chest x-ray negative. Most likely viral URI versus bronchitis. Will start on decongestants, Tessalon for cough, rest, continue Advil for fever and body aches, follow-up with primary care doctor.  Vitals:   08/09/16 0740  BP: 130/81  Pulse: 107  Resp: 17  Temp: 98.6 F (37 C)  TempSrc: Oral  SpO2: 98%      Final Clinical Impressions(s) / ED Diagnoses   Final diagnoses:  Bronchitis  Viral URI with cough    New Prescriptions New Prescriptions   BENZONATATE (TESSALON) 100 MG CAPSULE    Take 1 capsule (100 mg total) by mouth every 8 (eight) hours.   GUAIFENESIN-DEXTROMETHORPHAN (ROBITUSSIN DM) 100-10 MG/5ML SYRUP    Take 5 mLs by mouth every 4 (four) hours as needed for cough.   PSEUDOEPHEDRINE (SUDAFED) 30 MG TABLET    Take 1 tablet (30 mg total) by mouth every 6 (six) hours as needed for congestion.     Jeannett Senior, PA-C 08/09/16 [place], MD 08/09/16 [phone]

## 2016-08-09 NOTE — ED Notes (Signed)
Returned from xray

## 2016-08-09 NOTE — Discharge Instructions (Signed)
Continue Advil for pain and fever. Take Robitussin-DM and Tessalon as prescribed as needed for cough. Sudafed for congestion. Please follow-up with your doctor next week for recheck.

## 2016-08-09 NOTE — ED Triage Notes (Signed)
Pt sts right earpain with cough and fever x 3 days

## 2016-08-30 ENCOUNTER — Encounter: Payer: Self-pay | Admitting: Gastroenterology

## 2016-08-30 ENCOUNTER — Encounter (INDEPENDENT_AMBULATORY_CARE_PROVIDER_SITE_OTHER): Payer: Self-pay

## 2016-08-30 ENCOUNTER — Ambulatory Visit (INDEPENDENT_AMBULATORY_CARE_PROVIDER_SITE_OTHER): Payer: BLUE CROSS/BLUE SHIELD | Admitting: Gastroenterology

## 2016-08-30 VITALS — BP 100/60 | HR 76 | Ht 59.0 in | Wt 191.0 lb

## 2016-08-30 DIAGNOSIS — Z23 Encounter for immunization: Secondary | ICD-10-CM | POA: Diagnosis not present

## 2016-08-30 DIAGNOSIS — K7469 Other cirrhosis of liver: Secondary | ICD-10-CM

## 2016-08-30 DIAGNOSIS — I8510 Secondary esophageal varices without bleeding: Secondary | ICD-10-CM | POA: Diagnosis not present

## 2016-08-30 DIAGNOSIS — R7989 Other specified abnormal findings of blood chemistry: Secondary | ICD-10-CM

## 2016-08-30 DIAGNOSIS — R945 Abnormal results of liver function studies: Secondary | ICD-10-CM

## 2016-08-30 MED ORDER — NADOLOL 20 MG PO TABS: 20.0000 mg | ORAL_TABLET | Freq: Every day | ORAL | 3 refills | Status: DC

## 2016-08-30 NOTE — Progress Notes (Signed)
[facility] Progress Note  Chief Complaint: Cirrhosis  Subjective  History:  This is follow-up for a 51-year-old Latino woman whom I met during an October 2017 hospital consult. She was diagnosed with cirrhosis at that point because she had come in with abdominal pain from splenic infarcts. Her cirrhosis is most likely on the basis of fatty liver, other lab workup was unrevealing. LFTs notable for normal ALT, AST 46, alkaline phosphatase 182, total bilirubin 3.5 platelets low at 127, INR slightly elevated at 1.3. She had a positive hepatitis A antibody, negative hepatitis B surface antigen and core antibody, and it does not appear that hepatitis C test was done.  She has no encephalopathy, and only trace perihepatic ascites on CT scan. She did appear to have upper abdominal varices as well on CT scan.  No interpreter was available today, I communicated with her and her sister in limited Spanish.  ROS: Cardiovascular:  no chest pain Respiratory: no dyspnea  The patient's Past Medical, Family and Social History were reviewed and are on file in the EMR.  Objective:  Med list reviewed  Vital signs in last 24 hrs: Vitals:   08/30/16 1353  BP: 100/60  Pulse: 76    Physical Exam    HEENT: sclera anicteric, oral mucosa moist without lesions  Neck: supple, no thyromegaly, JVD or lymphadenopathy  Cardiac: RRR without murmurs, S1S2 heard, no peripheral edema  Pulm: clear to auscultation bilaterally, normal RR and effort noted  Abdomen: soft, Obese, no tenderness, with active bowel sounds. No guarding or palpable hepatosplenomegaly.  Skin; warm and dry, no jaundice or rash  The above results for data   @[email or web address]@ Assessment: Encounter Diagnoses  Name Primary?  . Other cirrhosis of liver (HCC) Yes  . Elevated LFTs   . Secondary esophageal varices without bleeding (HCC)    Continue low-dose nadolol Continue with primary care follow-up for management of diabetes  and hopeful weight loss   Plan:  Upper endoscopy to assess esophageal varices Hepatitis C antibody Hepatitis B vaccination #1 was given  Total time 30 minutes, over half spent in counseling and coordination of care.   Henry L Danis III  

## 2016-08-30 NOTE — Patient Instructions (Signed)
If you are age 52 or older, your body mass index should be between 23-30. Your Body mass index is 38.58 kg/m. If this is out of the aforementioned range listed, please consider follow up with your Primary Care Provider.  If you are age 56 or younger, your body mass index should be between 19-25. Your Body mass index is 38.58 kg/m. If this is out of the aformentioned range listed, please consider follow up with your Primary Care Provider.   You have been scheduled for an endoscopy. Please follow written instructions given to you at your visit today. If you use inhalers (even only as needed), please bring them with you on the day of your procedure. Your physician has requested that you go to www.startemmi.com and enter the access code given to you at your visit today. This web site gives a general overview about your procedure. However, you should still follow specific instructions given to you by our office regarding your preparation for the procedure.  Today we have given you the first hep B injection. Please return in a month for the 2nd.  Thank you for choosing [facility]  Dr Wilfrid Lund III

## 2016-08-31 ENCOUNTER — Other Ambulatory Visit: Payer: Self-pay

## 2016-08-31 DIAGNOSIS — K7460 Unspecified cirrhosis of liver: Secondary | ICD-10-CM

## 2016-09-18 ENCOUNTER — Ambulatory Visit (AMBULATORY_SURGERY_CENTER): Payer: BLUE CROSS/BLUE SHIELD | Admitting: Gastroenterology

## 2016-09-18 ENCOUNTER — Encounter: Payer: Self-pay | Admitting: Gastroenterology

## 2016-09-18 ENCOUNTER — Other Ambulatory Visit: Payer: BLUE CROSS/BLUE SHIELD

## 2016-09-18 ENCOUNTER — Other Ambulatory Visit: Payer: Self-pay

## 2016-09-18 VITALS — BP 125/65 | HR 71 | Temp 99.1°F | Resp 13 | Ht 59.0 in | Wt 191.0 lb

## 2016-09-18 DIAGNOSIS — K7460 Unspecified cirrhosis of liver: Secondary | ICD-10-CM

## 2016-09-18 DIAGNOSIS — K7469 Other cirrhosis of liver: Secondary | ICD-10-CM

## 2016-09-18 DIAGNOSIS — I8500 Esophageal varices without bleeding: Secondary | ICD-10-CM

## 2016-09-18 LAB — GLUCOSE, CAPILLARY
GLUCOSE-CAPILLARY: 90 mg/dL (ref 65–99)
GLUCOSE-CAPILLARY: 102 mg/dL — AB (ref 65–99)

## 2016-09-18 LAB — HEPATITIS C ANTIBODY: HCV AB: NEGATIVE

## 2016-09-18 MED ORDER — SODIUM CHLORIDE 0.9 % IV SOLN: 500.0000 mL | INTRAVENOUS | Status: DC

## 2016-09-18 NOTE — Progress Notes (Signed)
Instruction given in [place] and reviewed with Julieanne Manson, interpreter, to patient.  Verbalized understanding.

## 2016-09-18 NOTE — Patient Instructions (Signed)
YOU HAD AN ENDOSCOPIC PROCEDURE TODAY AT [facility]:   Refer to the procedure report that was given to you for any specific questions about what was found during the examination.  If the procedure report does not answer your questions, please call your gastroenterologist to clarify.  If you requested that your care partner not be given the details of your procedure findings, then the procedure report has been included in a sealed envelope for you to review at your convenience later.  YOU SHOULD EXPECT: Some feelings of bloating in the abdomen. Passage of more gas than usual.  Walking can help get rid of the air that was put into your GI tract during the procedure and reduce the bloating. If you had a lower endoscopy (such as a colonoscopy or flexible sigmoidoscopy) you may notice spotting of blood in your stool or on the toilet paper. If you underwent a bowel prep for your procedure, you may not have a normal bowel movement for a few days.  Please Note:  You might notice some irritation and congestion in your nose or some drainage.  This is from the oxygen used during your procedure.  There is no need for concern and it should clear up in a day or so.  SYMPTOMS TO REPORT IMMEDIATELY:   Following lower endoscopy (colonoscopy or flexible sigmoidoscopy):  Excessive amounts of blood in the stool  Significant tenderness or worsening of abdominal pains  Swelling of the abdomen that is new, acute  Fever of 100F or higher   Following upper endoscopy (EGD)  Vomiting of blood or coffee ground material  New chest pain or pain under the shoulder blades  Painful or persistently difficult swallowing  New shortness of breath  Fever of 100F or higher  Black, tarry-looking stools  For urgent or emergent issues, a gastroenterologist can be reached at any hour by calling [phone].   DIET:  We do recommend a small meal at first, but then you may proceed to your regular diet.  Drink  plenty of fluids but you should avoid alcoholic beverages for 24 hours.  ACTIVITY:  You should plan to take it easy for the rest of today and you should NOT DRIVE or use heavy machinery until tomorrow (because of the sedation medicines used during the test).    FOLLOW UP: Our staff will call the number listed on your records the next business day following your procedure to check on you and address any questions or concerns that you may have regarding the information given to you following your procedure. If we do not reach you, we will leave a message.  However, if you are feeling well and you are not experiencing any problems, there is no need to return our call.  We will assume that you have returned to your regular daily activities without incident.  If any biopsies were taken you will be contacted by phone or by letter within the next 1-3 weeks.  Please call us at [phone] if you have not heard about the biopsies in 3 weeks.    SIGNATURES/CONFIDENTIALITY: You and/or your care partner have signed paperwork which will be entered into your electronic medical record.  These signatures attest to the fact that that the information above on your After Visit Summary has been reviewed and is understood.  Full responsibility of the confidentiality of this discharge information lies with you and/or your care-partner.  Continue present medications, including Nadolol for primary variceal prophylaxos.  To  lab for Hepatitis C antibody test.

## 2016-09-18 NOTE — Progress Notes (Signed)
Patient awakening,vss,report to rn 

## 2016-09-18 NOTE — Op Note (Signed)
[place] [facility] Patient Name: Deborah Pham Procedure Date: 09/18/2016 2:55 PM MRN: [id number] Endoscopist: [place]. Loletha Carrow , MD Age: 51 Referring MD:  Date of Birth: Jun 02, 1966 Gender: Female Account #: [id number] Procedure:                Upper GI endoscopy Indications:              Cirrhosis with suspected esophageal varices Medicines:                Monitored Anesthesia Care Procedure:                Pre-Anesthesia Assessment:                           - Prior to the procedure, a History and Physical                            was performed, and patient medications and                            allergies were reviewed. The patient's tolerance of                            previous anesthesia was also reviewed. The risks                            and benefits of the procedure and the sedation                            options and risks were discussed with the patient.                            All questions were answered, and informed consent                            was obtained. Prior Anticoagulants: The patient has                            taken no previous anticoagulant or antiplatelet                            agents. ASA Grade Assessment: III - A patient with                            severe systemic disease. After reviewing the risks                            and benefits, the patient was deemed in                            satisfactory condition to undergo the procedure.                           After obtaining informed consent, the endoscope was  passed under direct vision. Throughout the                            procedure, the patient's blood pressure, pulse, and                            oxygen saturations were monitored continuously. The                            Model GIF-[id number]) scope was introduced                            through the mouth, and advanced to the second part     of duodenum. The upper GI endoscopy was                            accomplished without difficulty. The patient                            tolerated the procedure well. Scope In: Scope Out: Findings:                 Grade II varices were found in the lower third of                            the esophagus.                           Type 1 gastroesophageal varices (GOV1, esophageal                            varices which extend along the lesser curvature)                            with no bleeding were found in the cardia.                           Moderate portal hypertensive gastropathy was found                            in the entire examined stomach.                           The examined duodenum was normal. Complications:            No immediate complications. Estimated Blood Loss:     Estimated blood loss: none. Impression:               - Grade II esophageal varices.                           - Type 1 gastroesophageal varices (GOV1, esophageal                            varices which extend along the lesser curvature),  without bleeding.                           - Portal hypertensive gastropathy.                           - Normal examined duodenum.                           - No specimens collected. Recommendation:           - Patient has a contact number available for                            emergencies. The signs and symptoms of potential                            delayed complications were discussed with the                            patient. Return to normal activities tomorrow.                            Written discharge instructions were provided to the                            patient.                           - Resume previous diet.                           - Continue present medications, including nadolol                            for primary variceal prophylaxis.                           - Hepatitis C antibody test to  complete serologic                            workup for cirrhosis. Henry L. Loletha Carrow, MD 09/18/2016 3:28:31 PM This report has been signed electronically.

## 2016-09-19 ENCOUNTER — Telehealth: Payer: Self-pay | Admitting: *Deleted

## 2016-09-19 NOTE — Telephone Encounter (Signed)
  Follow up Call-  Call back number 09/18/2016  Post procedure Call Back phone  # [phone] (brother's number)  Permission to leave phone message Yes  Some recent data might be hidden     Patient questions:  Do you have a fever, pain , or abdominal swelling? No. Pain Score  0 *  Have you tolerated food without any problems? Yes.    Have you been able to return to your normal activities? Yes.    Do you have any questions about your discharge instructions: Diet   No. Medications  No. Follow up visit  Yes.    Do you have questions or concerns about your Care? Yes.    Actions: * If pain score is 4 or above: No action needed, pain <4.   Patients friend was the recipient of of this call. Patient speaks Spanish. Patient had sent a message asking what were results and was there follow up. Informed that the patient does have a hard copy of procedure results and discharge instructions. Reviewed patient recommendations. Patient to call with any further needs or concerns.

## 2016-10-02 ENCOUNTER — Ambulatory Visit (INDEPENDENT_AMBULATORY_CARE_PROVIDER_SITE_OTHER): Payer: BLUE CROSS/BLUE SHIELD | Admitting: Gastroenterology

## 2016-10-02 DIAGNOSIS — Z23 Encounter for immunization: Secondary | ICD-10-CM

## 2016-10-02 NOTE — Progress Notes (Signed)
Patient tolerated hep b vaccine well, waited in lobby 20 minutes. Recall in system for her 3rd injection.

## 2016-12-03 ENCOUNTER — Ambulatory Visit: Payer: BLUE CROSS/BLUE SHIELD | Admitting: Gastroenterology

## 2016-12-07 ENCOUNTER — Ambulatory Visit: Payer: BLUE CROSS/BLUE SHIELD | Admitting: Gastroenterology

## 2016-12-13 ENCOUNTER — Encounter: Payer: Self-pay | Admitting: Gastroenterology

## 2017-01-08 ENCOUNTER — Encounter (INDEPENDENT_AMBULATORY_CARE_PROVIDER_SITE_OTHER): Payer: Self-pay

## 2017-01-08 ENCOUNTER — Encounter: Payer: Self-pay | Admitting: Gastroenterology

## 2017-01-08 ENCOUNTER — Other Ambulatory Visit: Payer: Self-pay | Admitting: Obstetrics and Gynecology

## 2017-01-08 ENCOUNTER — Ambulatory Visit (INDEPENDENT_AMBULATORY_CARE_PROVIDER_SITE_OTHER): Payer: Self-pay | Admitting: Gastroenterology

## 2017-01-08 ENCOUNTER — Ambulatory Visit: Payer: BLUE CROSS/BLUE SHIELD | Admitting: Gastroenterology

## 2017-01-08 VITALS — BP 130/80 | HR 82 | Ht 59.5 in | Wt 204.0 lb

## 2017-01-08 DIAGNOSIS — E119 Type 2 diabetes mellitus without complications: Secondary | ICD-10-CM

## 2017-01-08 DIAGNOSIS — Z1231 Encounter for screening mammogram for malignant neoplasm of breast: Secondary | ICD-10-CM

## 2017-01-08 DIAGNOSIS — K7469 Other cirrhosis of liver: Secondary | ICD-10-CM

## 2017-01-08 DIAGNOSIS — R140 Abdominal distension (gaseous): Secondary | ICD-10-CM

## 2017-01-08 DIAGNOSIS — I8510 Secondary esophageal varices without bleeding: Secondary | ICD-10-CM

## 2017-01-08 NOTE — Progress Notes (Signed)
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 She also has obesity and type 2 diabetes on oral medication. She is been following up with a primary care provider that appears to be at an urgent care center affiliated with [place] [facility]. She was seen with the aid of a Spanish interpreter today. She was complaining of some upper abdominal bloating and discomfort after eating certain foods. She also noticed an increase of these symptoms when there is stress. She denies nausea or vomiting and has been gaining some weight. She feels her diabetes is not under good control, with both high and low blood sugars in any given day. She is uninsured and no longer working, which has been a major obstacle to her accessing medical care. Her upper endoscopy in January showed grade 2 esophageal varices and small gastric cardia varices.  ROS: Cardiovascular:  no chest pain Respiratory: no dyspnea  The patient's Past Medical, Family and Social History were reviewed and are on file in the EMR.  Objective:  Med list reviewed  Vital signs in last 24 hrs: Vitals:   01/08/17 0833  BP: 130/80  Pulse: 82    Physical Exam    HEENT: sclera anicteric, oral mucosa moist without lesions  Neck: supple, no thyromegaly, JVD or lymphadenopathy  Cardiac: RRR without murmurs, S1S2 heard, no peripheral edema  Pulm: clear to auscultation bilaterally, normal RR and effort noted  Abdomen:Exam limited by obesity, soft, Mild epigastric tenderness, with active bowel sounds. Cannot assess hepatosplenomegaly due to body habitus  Skin; warm and dry, no jaundice or rash Neuro: Steady gait, normal affect, no asterixis, normal gross motor function Recent Labs:  HCV Ab neg  EGD 08/2016:  Grade 2 Esophageal varices and gastric varices   @ASSESSMENTPLANBEGIN @ Assessment: Encounter Diagnoses  Name Primary?  .  Other cirrhosis of liver ([place]) Yes  . Secondary esophageal varices without bleeding ([place])   . Type 2 diabetes mellitus without complication, without long-term current use of insulin ([place])   . Abdominal bloating    Bloating sounds likely to be dietary related. While she has gained weight, her exam is consistent with truncal obesity rather than ascites.  Unfortunately, she states that she cannot afford to have any labs or imaging done at this point. She is due for updated CBC, CMP, INR and alpha-fetoprotein. She is also due for screening ultrasound, the last one having been done in October 2017.  Plan: I had a long discussion with her through the interpreter today that I feel she needs more coordinated and consistent primary care that she can access and hopefully afford. Ideally, that should be in the [facility] system, given the complexity of her medical issues.  We have made arrangements for her to be seen in the internal medicine resident clinic at [facility] at the end of next month. At that time, I am hopeful that the above labs and imaging can get done.  I am also concerned that she should no longer be on metformin due to the risk of lactic acidosis in this patient's with cirrhosis. I do not know whether Clement per eye by itself will be sufficient for control of her diabetes, but I would like this addressed during her first internal medicine clinic visit.  We have also given her an appointment in our clinic with the nurse in about 2 months for her third and  final hepatitis B vaccination. She should see me in about 6 months or sooner as needed.   Total time 30 minutes, over half spent in counseling and coordination of care, with more time than usual required due to the need for interpreter services.  [name]   CC: Gwenlyn Perking, M.D.

## 2017-01-08 NOTE — Patient Instructions (Signed)
If you are age 52 or older, your body mass index should be between 23-30. Your Body mass index is 40.51 kg/m. If this is out of the aforementioned range listed, please consider follow up with your Primary Care Provider.  If you are age 60 or younger, your body mass index should be between 19-25. Your Body mass index is 40.51 kg/m. If this is out of the aformentioned range listed, please consider follow up with your Primary Care Provider.   [facility] resident clinic 02-21-2017 @ 315pm [phone]  Thank you for choosing [facility]  Dr Wilfrid Lund III

## 2017-01-10 ENCOUNTER — Ambulatory Visit (HOSPITAL_COMMUNITY)
Admission: RE | Admit: 2017-01-10 | Discharge: 2017-01-10 | Disposition: A | Discharge status: Home / Self Care | Payer: Self-pay | Source: Ambulatory Visit | Attending: Obstetrics and Gynecology | Admitting: Obstetrics and Gynecology

## 2017-01-10 ENCOUNTER — Ambulatory Visit
Admission: RE | Admit: 2017-01-10 | Discharge: 2017-01-10 | Disposition: A | Discharge status: Home / Self Care | Payer: Self-pay | Source: Ambulatory Visit | Attending: Obstetrics and Gynecology | Admitting: Obstetrics and Gynecology

## 2017-01-10 ENCOUNTER — Encounter (HOSPITAL_COMMUNITY): Payer: Self-pay

## 2017-01-10 VITALS — BP 128/100 | Temp 97.9°F | Ht 59.0 in | Wt 204.6 lb

## 2017-01-10 DIAGNOSIS — Z01419 Encounter for gynecological examination (general) (routine) without abnormal findings: Secondary | ICD-10-CM

## 2017-01-10 DIAGNOSIS — Z1231 Encounter for screening mammogram for malignant neoplasm of breast: Secondary | ICD-10-CM

## 2017-01-10 NOTE — Progress Notes (Signed)
No complaints today.   Pap Smear: Pap smear completed today. Last Pap smear was 10/11/2008 and normal. Per patient has no history of an abnormal Pap smear. Last Pap smear result is in EPIC.  Physical exam: Breasts Breasts symmetrical. No skin abnormalities bilateral breasts. No nipple retraction bilateral breasts. No nipple discharge bilateral breasts. No lymphadenopathy. No lumps palpated bilateral breasts. No complaints of pain or tenderness on exam. Referred patient to the [facility] for a screening mammogram. Appointment scheduled for Thursday, Jan 10, 2017 at 1240.  Pelvic/Bimanual   Ext Genitalia No lesions, no swelling and no discharge observed on external genitalia.         Vagina Vagina pink and normal texture. No lesions or discharge observed in vagina.          Cervix Cervix is present. Cervix pink and of normal texture. Cervix friable. No discharge observed.     Uterus Uterus is present and palpable. Uterus in normal position and normal size.        Adnexae Bilateral ovaries present and unable to palpate. No tenderness on palpation.          Rectovaginal No rectal exam completed today since patient had no rectal complaints. No skin abnormalities observed on exam.    Smoking History: Patient has never smoked.  Patient Navigation: Patient education provided. Access to services provided for patient through [facility] program. Spanish interpreter provided.  Colorectal Cancer Screening: Per patient has never had a colonoscopy completed. No complaints today. FIT Test given to patient to complete and return to [facility].  Used Spanish interpreter Marlene [place] ([place] from [facility].

## 2017-01-10 NOTE — Patient Instructions (Signed)
Explained breast self awareness with Deborah Flores FLORES-Vasconez. Let patient know [facility] will cover Pap smears and HPV typing every 5 years unless has a history of abnormal Pap smears. Referred patient to the [facility] for a screening mammogram. Appointment scheduled for Thursday, Jan 10, 2017 at 1240. Let patient know will follow up with her within the next couple weeks with results of Pap smear by phone. Informed patient that the [facility] will follow up with her within the next couple of weeks with results of mammogram by letter or phone. Deborah Pham verbalized understanding.  Kymber Kosar, Arvil Chaco, RN 12:50 PM

## 2017-01-11 ENCOUNTER — Encounter (HOSPITAL_COMMUNITY): Payer: Self-pay | Admitting: *Deleted

## 2017-01-11 LAB — CYTOLOGY - PAP
DIAGNOSIS: NEGATIVE
HPV (WINDOPATH): NOT DETECTED

## 2017-01-14 ENCOUNTER — Other Ambulatory Visit: Payer: Self-pay | Admitting: Obstetrics and Gynecology

## 2017-01-18 LAB — FECAL OCCULT BLOOD, IMMUNOCHEMICAL: Fecal Occult Bld: NEGATIVE

## 2017-01-29 ENCOUNTER — Telehealth (HOSPITAL_COMMUNITY): Payer: Self-pay | Admitting: *Deleted

## 2017-01-29 NOTE — Telephone Encounter (Signed)
Telephoned patient at home number and advised patient of negative pap smear results. HPV was negative. Next pap smear due in five years. Patient voiced understanding. Used interpreter Lavon Paganini.

## 2017-02-07 ENCOUNTER — Telehealth: Payer: Self-pay | Admitting: Gastroenterology

## 2017-02-07 MED ORDER — NADOLOL 20 MG PO TABS: 20.0000 mg | ORAL_TABLET | Freq: Every day | ORAL | 6 refills | Status: DC

## 2017-02-07 NOTE — Telephone Encounter (Signed)
Yes, please refill for one month supply and six refills at this dose once daily.

## 2017-02-07 NOTE — Telephone Encounter (Signed)
Refill request for Nadolol 20 mg one a day. Last seen in May 2018

## 2017-02-13 ENCOUNTER — Encounter (HOSPITAL_COMMUNITY): Payer: Self-pay | Admitting: *Deleted

## 2017-02-13 NOTE — Progress Notes (Signed)
Letter mailed to patient with negative Fit Test results.

## 2017-02-21 ENCOUNTER — Encounter: Payer: Self-pay | Admitting: Internal Medicine

## 2017-02-21 ENCOUNTER — Ambulatory Visit (INDEPENDENT_AMBULATORY_CARE_PROVIDER_SITE_OTHER): Payer: Self-pay | Admitting: Internal Medicine

## 2017-02-21 VITALS — BP 139/64 | HR 72 | Temp 97.9°F | Ht 59.0 in | Wt 211.9 lb

## 2017-02-21 DIAGNOSIS — K7469 Other cirrhosis of liver: Secondary | ICD-10-CM

## 2017-02-21 DIAGNOSIS — I8510 Secondary esophageal varices without bleeding: Secondary | ICD-10-CM

## 2017-02-21 DIAGNOSIS — E118 Type 2 diabetes mellitus with unspecified complications: Secondary | ICD-10-CM

## 2017-02-21 DIAGNOSIS — Z7984 Long term (current) use of oral hypoglycemic drugs: Secondary | ICD-10-CM

## 2017-02-21 DIAGNOSIS — K760 Fatty (change of) liver, not elsewhere classified: Secondary | ICD-10-CM

## 2017-02-21 LAB — PROTIME-INR
INR: 1.15
PROTHROMBIN TIME: 14.8 s (ref 11.4–15.2)

## 2017-02-21 LAB — GLUCOSE, CAPILLARY: Glucose-Capillary: 217 mg/dL — ABNORMAL HIGH (ref 65–99)

## 2017-02-21 LAB — POCT GLYCOSYLATED HEMOGLOBIN (HGB A1C): Hemoglobin A1C: 6.1

## 2017-02-21 NOTE — Patient Instructions (Signed)
Deborah Pham,  Fue un placer en conocerte.  Por favor regese en 3 meses para chequear tu azucar Por favor pare de tomar metformin y solamente tome Glimepiride Por favor cheque tu Geologist, engineering al dia, en la manana y por la tarde y traiga tu maquina a la proxima visita en 3 meses Mientrastanto puedes llamar la clinica para hacer una cita en el ACUTE CARE CLINIC cuando necesitas ver un doctor

## 2017-02-21 NOTE — Progress Notes (Signed)
   CC: Follow up on type II diabetes mellitus  HPI:  [name] is a 51 y.o. woman with history noted below that presents to the Internal Medicine Clinic for follow up on her type II diabetes mellitus.  She states she currently takes metformin 1045m twice a day and glimepiride 171mdaily.  Her metformin was increased this past October and she was started on glimepiride at that time as well.  She states she has a glucometer but did not have it with her today.  She states she checks her blood glucose once a day and usually runs around 200.  She denies any hypoglycemic episodes.    Past Medical History:  Diagnosis Date  . Diabetes mellitus without complication ([place]  . NASH (nonalcoholic steatohepatitis)   . Obesity     Review of Systems:  Review of Systems  Constitutional: Negative for malaise/fatigue and weight loss.  Respiratory: Negative for shortness of breath.   Cardiovascular: Negative for chest pain.  Gastrointestinal: Negative for abdominal pain, nausea and vomiting.  Neurological: Negative for dizziness.     Physical Exam:  Vitals:   02/21/17 1621 02/21/17 1627  BP: (!) 143/53 139/64  Pulse: 71 72  Temp: 97.9 F (36.6 C)   TempSrc: Oral   SpO2: 99%   Weight: 211 lb 14.4 oz (96.1 kg)   Height: 4' 11"  (1.499 m)    Physical Exam  Constitutional: She is well-developed, well-nourished, and in no distress.  Cardiovascular: Normal rate, regular rhythm and normal heart sounds.   Pulmonary/Chest: Effort normal and breath sounds normal. No respiratory distress. She has no wheezes. She has no rales.  Abdominal: Soft. She exhibits no distension and no mass. There is no tenderness. There is no rebound and no guarding.  Musculoskeletal: She exhibits no edema.    Assessment & Plan:   See encounters tab for problem based medical decision making.    Patient discussed with  Dr. NaDareen Piano

## 2017-02-22 LAB — CMP14 + ANION GAP
A/G RATIO: 1.1 — AB (ref 1.2–2.2)
ALBUMIN: 3.6 g/dL (ref 3.5–5.5)
ALT: 23 IU/L (ref 0–32)
AST: 42 IU/L — ABNORMAL HIGH (ref 0–40)
Alkaline Phosphatase: 152 IU/L — ABNORMAL HIGH (ref 39–117)
Anion Gap: 15 mmol/L (ref 10.0–18.0)
BILIRUBIN TOTAL: 0.9 mg/dL (ref 0.0–1.2)
BUN / CREAT RATIO: 10 (ref 9–23)
BUN: 7 mg/dL (ref 6–24)
CALCIUM: 8.9 mg/dL (ref 8.7–10.2)
CO2: 22 mmol/L (ref 20–29)
Chloride: 104 mmol/L (ref 96–106)
Creatinine, Ser: 0.69 mg/dL (ref 0.57–1.00)
GFR, EST AFRICAN AMERICAN: 116 mL/min/{1.73_m2} (ref >59)
GFR, EST NON AFRICAN AMERICAN: 100 mL/min/{1.73_m2} (ref >59)
GLUCOSE: 228 mg/dL — AB (ref 65–99)
Globulin, Total: 3.3 g/dL (ref 1.5–4.5)
POTASSIUM: 3.7 mmol/L (ref 3.5–5.2)
Sodium: 141 mmol/L (ref 134–144)
TOTAL PROTEIN: 6.9 g/dL (ref 6.0–8.5)

## 2017-02-22 LAB — CBC
HEMATOCRIT: 40.4 % (ref 34.0–46.6)
HEMOGLOBIN: 14.9 g/dL (ref 11.1–15.9)
MCH: 34.3 pg — AB (ref 26.6–33.0)
MCHC: 36.9 g/dL — AB (ref 31.5–35.7)
MCV: 93 fL (ref 79–97)
Platelets: 96 10*3/uL — CL (ref 150–379)
RBC: 4.34 x10E6/uL (ref 3.77–5.28)
RDW: 13.8 % (ref 12.3–15.4)
WBC: 4.6 10*3/uL (ref 3.4–10.8)

## 2017-02-22 LAB — MICROALBUMIN / CREATININE URINE RATIO
Creatinine, Urine: 32.2 mg/dL
MICROALB/CREAT RATIO: 26.1 mg/g{creat} (ref 0.0–30.0)
MICROALBUM., U, RANDOM: 8.4 ug/mL

## 2017-02-22 LAB — AFP TUMOR MARKER: AFP-Tumor Marker: 7.4 ng/mL (ref 0.0–8.3)

## 2017-02-27 DIAGNOSIS — E118 Type 2 diabetes mellitus with unspecified complications: Secondary | ICD-10-CM | POA: Insufficient documentation

## 2017-02-27 NOTE — Assessment & Plan Note (Signed)
Assessment:  Type II diabetes mellitus Patient's hemoglobin A1C was 6.1 in office today.  Patient is taking metformin 1071m BID and glimepiride 135mdaily.  Prior to the increase in metformin and starting glimepiride patient's hemoglobin A1C was  8.9.  Because patient has cirrhosis will discontinue metformin due to the risk of lactic acidosis in patient's with this disease process.  At this point will not add another oral agent.  Will recheck hemoglobin A1C in 3 months to be able to fully assess the need for another glucose lowering agent.  Plan -Discontinue metformin - continue glimepiride -referral to diabetes coordinator - recheck hemoglobin A1C in 3 months.

## 2017-02-27 NOTE — Assessment & Plan Note (Signed)
Assessment: Cirrhosis secondary to NAFLD Patient follows with Dr. Loletha Carrow, [facility].  Patient had grade II esophageal varices noted on EGD.  She was recently started on nadlol 76m.  [facility]  Plan -CBC, CMP, INR and alpha-fetoprotein - HCC screening ultrasound

## 2017-02-28 NOTE — Progress Notes (Signed)
Internal Medicine Clinic Attending  Case discussed with Dr. Hoffman at the time of the visit.  We reviewed the resident's history and exam and pertinent patient test results.  I agree with the assessment, diagnosis, and plan of care documented in the resident's note.  

## 2017-03-01 ENCOUNTER — Ambulatory Visit (INDEPENDENT_AMBULATORY_CARE_PROVIDER_SITE_OTHER): Payer: Self-pay | Admitting: Gastroenterology

## 2017-03-01 DIAGNOSIS — Z23 Encounter for immunization: Secondary | ICD-10-CM

## 2017-03-28 ENCOUNTER — Ambulatory Visit (HOSPITAL_COMMUNITY): Admission: RE | Admit: 2017-03-28 | Payer: Self-pay | Source: Ambulatory Visit

## 2017-04-09 ENCOUNTER — Ambulatory Visit (INDEPENDENT_AMBULATORY_CARE_PROVIDER_SITE_OTHER): Payer: Self-pay | Admitting: Dietician

## 2017-04-09 ENCOUNTER — Ambulatory Visit (INDEPENDENT_AMBULATORY_CARE_PROVIDER_SITE_OTHER): Payer: Self-pay | Admitting: Internal Medicine

## 2017-04-09 VITALS — BP 167/76 | HR 70 | Temp 98.0°F | Wt 215.5 lb

## 2017-04-09 DIAGNOSIS — R1012 Left upper quadrant pain: Secondary | ICD-10-CM

## 2017-04-09 DIAGNOSIS — E119 Type 2 diabetes mellitus without complications: Secondary | ICD-10-CM

## 2017-04-09 DIAGNOSIS — Z713 Dietary counseling and surveillance: Secondary | ICD-10-CM

## 2017-04-09 DIAGNOSIS — Z7984 Long term (current) use of oral hypoglycemic drugs: Secondary | ICD-10-CM

## 2017-04-09 DIAGNOSIS — E118 Type 2 diabetes mellitus with unspecified complications: Secondary | ICD-10-CM

## 2017-04-09 MED ORDER — LIDOCAINE 4 % EX CREA: 1.0000 "application " | TOPICAL_CREAM | Freq: Every day | CUTANEOUS | 3 refills | Status: DC | PRN

## 2017-04-09 NOTE — Patient Instructions (Addendum)
Fue un placer conocerla hoy, Deborah Pham.   Envi la crema de lidocaina a la farmacia de Public house manager en [employer] Por favor, usela solamente en el rea indicada cuando tenga dolor. Puede continuar tomando Advil si lo desea.   Llame a la clnica si sus sntomas empeoran.   Tiene cita de seguimiento con la doctora Hoffman el 4 de [place].

## 2017-04-09 NOTE — Progress Notes (Signed)
  Medical Nutrition Therapy:  Appt start time: 1310 end time:  1400. Visit # 1  Assessment:  Primary concerns today: glycemic control, NASH and weight loss.  Ms. Linsley is here with her husband who says he also has diabetes and would be willing to eat like she eats. Using the pacific interpreters we discussed meal planning for NASH and glycemic control.  She walks 3-5 days a week for 30-40 minutes each time. Preferred Learning Style: No preference indicated  Learning Readiness: Ready and change in progress  ANTHROPOMETRICS: weight-215#, height-4'11", BMI- WEIGHT HISTory- need to assess SLEEP:need to assess   MEDICATIONS: glimiperide, taken off of metformin due to NASH BLOOD SUGAR:did not bring log or meter, says blood sugar before eating are in 200s at home now and were 120s when on metformin DIETARY INTAKE: Usual eating pattern includes 3 meals and ?  snacks per day.  24-hr recall:  B ( AM): cold cereal, 2% milk and coffee L ( PM): tortillas, vegetables,bean soup D ( PM): oatmeal or fruit Beverages: juice, water   Estimated energy needs: 1200 calories 135 g carbohydrates 60 g protein 40 g fat  Progress Towards Goal(s):  In progress.   Nutritional Diagnosis:  NB-1.1 Food and nutrition-related knowledge deficit As related to lack of prior diabetes training .  As evidenced by her report. .    Intervention:  Nutrition education about low fat, low carb meal planning. Coordination of care: consider adding alternative diabetes medication that assists with weight loss and lowers blood sugar better Teaching Method Utilized: Visual, Auditory, Hands on Handouts given during visit include: Diabetes meal planning in spanish, plate method Barriers to learning/adherence to lifestyle change: competing values Demonstrated degree of understanding via:  Teach Back   Monitoring/Evaluation:  Dietary intake, exercise, meter or logbook, and body weight in 3 week(s). [place],  [place] 04/09/2017 4:44 PM.

## 2017-04-10 NOTE — Progress Notes (Signed)
Internal Medicine Clinic Attending  I saw and evaluated the patient.  I personally confirmed the key portions of the history and exam documented by Dr. Lorne Skeens and I reviewed pertinent patient test results.  The assessment, diagnosis, and plan were formulated together and I agree with the documentation in the resident's note.

## 2017-04-10 NOTE — Assessment & Plan Note (Signed)
Patient presented with LUQ pain that started in late June. She was seen by PCP at that time and was told this was likely MSK pain due to coughing 2/2 viral illness and aggravation by movement. Pain is located below her L breast and she describes as a dull, superficial, nonradiating pain. Pain is intermittent and she is unable to tell how long it lasts. Denies worsening with movement, but states she has stopped wearing a brassiere due to pain. Denies trauma to this area or new rash. Has been taking Advil with complete resolution in pain, but would like to try other forms of treatment as she would not like to continue taking NSAIDs indefinitely. Denies N/V, and changes in BMs. Did have varicella zoster as a child, but reports no episodes of herpes zoster in the past. 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 Patient reports she was there for cirrhosis complications and did not present with LUQ pain.   Etiology of LUQ pain unclear at this time. Given superficial nature of pain MSK and herpes zoster are possible etiologies. However, pain is located below the ribs.  Herpes zoster can present with pain prior to development of rash, but her pain has been present > 58monthand no rash on exam. Unlikely post-herpetic neuralgia as she has never had a herpes zoster zoster episode in the past. Pain 2/2 hypersplenism also possible, though I would expect deep abdominal pain vs superficial one and/or referred  pain to L shoulder. Low suspicion for herniated disc as pain does not radiate to the back.   - Lidocaine cream 5% - Patient has appt with PCP on 10/4 and with GI on 10/15. Recommended to follow up with them and discuss this pain with both if it has not resolved by then.

## 2017-04-10 NOTE — Progress Notes (Signed)
   CC: LUQ pain   HPI:  [name] is a 51 y.o. with PMH as described below who presents to clinic with LUQ pain. Please see problem based assessment and plan for further details.   Past Medical History:  Diagnosis Date  . Diabetes mellitus without complication ([place])   . NASH (nonalcoholic steatohepatitis)   . Obesity    Review of Systems:   Review of Systems  Constitutional: Negative for chills and fever.  Cardiovascular: Negative for chest pain.  Gastrointestinal: Positive for abdominal pain. Negative for constipation, diarrhea, nausea and vomiting.  Musculoskeletal: Negative for myalgias.  Skin: Negative for itching and rash.    Physical Exam:  Vitals:   04/09/17 1411  BP: (!) 167/76  Pulse: 70  Temp: 98 F (36.7 C)  TempSrc: Oral  Weight: 215 lb 8 oz (97.8 kg)   General: pleasant female, obese, in no acute distress  HENT: NCAT, neck supple and FROM  Cardiac: regular rate and rhythm, nl S1/S2, no murmurs, rubs or gallops  Pulm: CTAB, no wheezes or crackles, no increased work of breathing  Abd: obese abdomen, soft, nontender to deep palpation over LUQ, bowel sounds present, no rebound tenderness Neuro: A&Ox3, no focal deficits  Ext: warm and well perfused, no peripheral edema  Derm: no rashes noted     Assessment & Plan:   See Encounters Tab for problem based charting.  Patient seen with Dr. [name]

## 2017-05-07 ENCOUNTER — Encounter: Payer: Self-pay | Admitting: Dietician

## 2017-05-23 ENCOUNTER — Encounter (INDEPENDENT_AMBULATORY_CARE_PROVIDER_SITE_OTHER): Payer: Self-pay

## 2017-05-30 ENCOUNTER — Encounter: Payer: Self-pay | Admitting: Internal Medicine

## 2017-05-30 ENCOUNTER — Ambulatory Visit (INDEPENDENT_AMBULATORY_CARE_PROVIDER_SITE_OTHER): Payer: Self-pay | Admitting: Internal Medicine

## 2017-05-30 ENCOUNTER — Encounter (INDEPENDENT_AMBULATORY_CARE_PROVIDER_SITE_OTHER): Payer: Self-pay

## 2017-05-30 VITALS — BP 134/57 | HR 69 | Temp 98.2°F | Ht 59.0 in | Wt 218.8 lb

## 2017-05-30 DIAGNOSIS — E119 Type 2 diabetes mellitus without complications: Secondary | ICD-10-CM

## 2017-05-30 DIAGNOSIS — E118 Type 2 diabetes mellitus with unspecified complications: Secondary | ICD-10-CM

## 2017-05-30 LAB — GLUCOSE, CAPILLARY: Glucose-Capillary: 198 mg/dL — ABNORMAL HIGH (ref 65–99)

## 2017-05-30 LAB — POCT GLYCOSYLATED HEMOGLOBIN (HGB A1C): Hemoglobin A1C: 8.1

## 2017-05-30 MED ORDER — GLIMEPIRIDE 1 MG PO TABS: 1.0000 mg | ORAL_TABLET | ORAL | 11 refills | Status: DC

## 2017-05-30 MED ORDER — GLIMEPIRIDE 2 MG PO TABS: 2.0000 mg | ORAL_TABLET | ORAL | 11 refills | Status: DC

## 2017-05-30 NOTE — Progress Notes (Signed)
   CC: follow up on type II diabetes  HPI:  [name] is a 51 y.o. female with history noted below that presents to the [facility] for follow up on type II diabetes.  Please see problem based charting for the status of patient's chronic medical conditions.  Past Medical History:  Diagnosis Date  . Diabetes mellitus without complication ([place])   . NASH (nonalcoholic steatohepatitis)   . Obesity     Review of Systems:  Review of Systems  Constitutional: Negative for malaise/fatigue.  Respiratory: Negative for shortness of breath.   Cardiovascular: Negative for chest pain.  Gastrointestinal: Negative for abdominal pain, nausea and vomiting.  Genitourinary: Negative for dysuria.  Endo/Heme/Allergies: Negative for polydipsia.     Physical Exam:  Vitals:   05/30/17 1415  BP: (!) 134/57  Pulse: 69  Temp: 98.2 F (36.8 C)  TempSrc: Oral  SpO2: 100%  Weight: 218 lb 12.8 oz (99.2 kg)  Height: 4' 11"  (1.499 m)   Physical Exam  Constitutional: No distress.  obese  Cardiovascular: Normal rate, regular rhythm and normal heart sounds.  Exam reveals no gallop and no friction rub.   No murmur heard. Pulmonary/Chest: Effort normal and breath sounds normal. No respiratory distress. She has no wheezes. She has no rales.  Abdominal: Soft. She exhibits no distension and no mass. There is no tenderness. There is no rebound and no guarding.  Musculoskeletal: She exhibits no tenderness.    Assessment & Plan:   See encounters tab for problem based medical decision making.    Patient discussed with Dr. Dareen Piano

## 2017-05-30 NOTE — Patient Instructions (Signed)
Deborah Pham,  Fue un placer verla hoy. Por favor empieze a tomar su glimepiride 60m una vez al dia.  Por favor regrese en un mes

## 2017-06-04 ENCOUNTER — Ambulatory Visit (INDEPENDENT_AMBULATORY_CARE_PROVIDER_SITE_OTHER): Payer: Self-pay | Admitting: Gastroenterology

## 2017-06-04 ENCOUNTER — Encounter: Payer: Self-pay | Admitting: Gastroenterology

## 2017-06-04 VITALS — BP 128/70 | HR 57 | Ht 59.0 in | Wt 218.8 lb

## 2017-06-04 DIAGNOSIS — I8510 Secondary esophageal varices without bleeding: Secondary | ICD-10-CM

## 2017-06-04 DIAGNOSIS — K7460 Unspecified cirrhosis of liver: Secondary | ICD-10-CM

## 2017-06-04 DIAGNOSIS — K7581 Nonalcoholic steatohepatitis (NASH): Secondary | ICD-10-CM

## 2017-06-04 NOTE — Patient Instructions (Signed)
You have been scheduled for an abdominal ultrasound at [facility] (1st floor of hospital) on 06-07-2017 at 930am. Please arrive 15 minutes prior to your appointment for registration. Make certain not to have anything to eat or drink 6 hours prior to your appointment. Should you need to reschedule your appointment, please contact radiology at [phone]. This test typically takes about 30 minutes to perform.  Thank you for choosing [facility]  Dr Wilfrid Lund III

## 2017-06-04 NOTE — Assessment & Plan Note (Addendum)
Assessment:  Type II diabetes  Patient's Hemoglobin A1C was 6.1 on 02/21/17 and patient was taking metformin 1043m BID and glimepiride 124mdaily at the time.  Metformin was discontinued at that clinic visit due history of cirrhosis as metformin increases the risk of lactic acidosis in patients with this disease process.  Patient was continued at glimepiride 70m86m Today Hemoglobin A1C is 8.1.  Patient's blood glucose has been between 120-200.  Will increase glimepiride to 2mg66mily  Plan -glimepiride 2mg 26mly -follow up on one month to access glucose control per meter and to titrate up on medication as needed -hemoglobin A1C due in Jan 2019

## 2017-06-04 NOTE — Progress Notes (Signed)
[facility] Progress Note  Chief Complaint: Cirrhosis from NASH  Subjective  History:  This patient follows up for her cirrhosis. She has a history of nonbleeding esophageal varices on primary prophylaxis with nadolol. She complains of some intermittent abdominal bloating. Bowel habits are regular without rectal bleeding. Fortunately, she established care with the internal medicine clinic at [facility], and is receiving more attention to her diabetes. Her hemoglobin A1c had gone up to 8.1, so her oral hypoglycemic med was increased and the metformin discontinued per my recommendation several months ago. I was concerned about the possibility of lactic acidosis in a cirrhotic patient on metformin. Cecelia came for her hepatitis injection in July.  ROS: Cardiovascular:  no chest pain Respiratory: no dyspnea  The patient's Past Medical, Family and Social History were reviewed and are on file in the EMR.  Objective:  Med list reviewed  Current Outpatient Prescriptions:  .  glimepiride (AMARYL) 2 MG tablet, Take 1 tablet (2 mg total) by mouth every morning., Disp: 30 tablet, Rfl: 11 .  lidocaine (LMX) 4 % cream, Apply 1 application topically daily as needed., Disp: 30 g, Rfl: 3 .  nadolol (CORGARD) 20 MG tablet, Take 1 tablet (20 mg total) by mouth daily., Disp: 30 tablet, Rfl: 6   Vital signs in last 24 hrs: Vitals:   06/04/17 1400  BP: 128/70  Pulse: (!) 57  SpO2: (!) 86%    Physical Exam  Her daughter is with her today. The entire encounter took place with the video phone Spanish interpreter.  HEENT: sclera anicteric, oral mucosa moist without lesions  Neck: supple, no thyromegaly, JVD or lymphadenopathy  Cardiac: RRR without murmurs, S1S2 heard, no peripheral edema  Pulm: clear to auscultation bilaterally, normal RR and effort noted  Abdomen: soft, Morbidly obese, no tenderness, with active bowel sounds. No guarding or palpable  hepatosplenomegaly.  Skin; warm and dry, no jaundice or rash  Recent Labs:  CMP Latest Ref Rng & Units 02/21/2017 06/23/2016 06/22/2016  Glucose 65 - 99 mg/dL 228(H) 218(H) 240(H)  BUN 6 - 24 mg/dL 7 11 11   Creatinine 0.57 - 1.00 mg/dL 0.69 0.97 1.18(H)  Sodium 134 - 144 mmol/L 141 133(L) 136  Potassium 3.5 - 5.2 mmol/L 3.7 3.4(L) 3.6  Chloride 96 - 106 mmol/L 104 103 104  CO2 20 - 29 mmol/L 22 21(L) 22  Calcium 8.7 - 10.2 mg/dL 8.9 7.6(L) 7.6(L)  Total Protein 6.0 - 8.5 g/dL 6.9 6.7 6.5  Total Bilirubin 0.0 - 1.2 mg/dL 0.9 3.5(H) 3.4(H)  Alkaline Phos 39 - 117 IU/L 152(H) 182(H) 187(H)  AST 0 - 40 IU/L 42(H) 46(H) 37  ALT 0 - 32 IU/L 23 24 27    CBC Latest Ref Rng & Units 02/21/2017 06/23/2016 06/22/2016  WBC 3.4 - 10.8 x10E3/uL 4.6 12.6(H) 16.6(H)  Hemoglobin 11.1 - 15.9 g/dL 14.9 14.0 13.0  Hematocrit 34.0 - 46.6 % 40.4 38.3 35.0(L)  Platelets 150 - 379 x10E3/uL 96(LL) 127(L) 94(L)   AFP 7.4 on 02/21/17  Radiologic studies: Last abd imaging 10/17   @ASSESSMENTPLANBEGIN @ Assessment: Encounter Diagnoses  Name Primary?  . Liver cirrhosis secondary to NASH ([place]) Yes  . Secondary esophageal varices without bleeding ([facility])    Compensated cirrhosis with only minimal ascites seen on CT scan last year. She has not had variceal bleeding, and does not have hepatic encephalopathy. Unfortunately, her lack of insurance has been an obstacle to consistent medical care. I am glad to see that her diabetic  cares receiving more attention lately. As before, I think this is her overriding long-term health issue, especially since it contributed to this cirrhosis in the way of metabolic syndrome.   Plan: Hepatic ultrasound for up-to-date hepatoma screening See me in 6 months   Total time 25 minutes of face-to-face time. Additional time required for the use of interpreter services.  [name]

## 2017-06-05 NOTE — Progress Notes (Signed)
Internal Medicine Clinic Attending  Case discussed with Dr. Hoffman at the time of the visit.  We reviewed the resident's history and exam and pertinent patient test results.  I agree with the assessment, diagnosis, and plan of care documented in the resident's note.  

## 2017-06-07 ENCOUNTER — Ambulatory Visit (HOSPITAL_COMMUNITY)
Admission: RE | Admit: 2017-06-07 | Discharge: 2017-06-07 | Disposition: A | Discharge status: Home / Self Care | Payer: Self-pay | Source: Ambulatory Visit | Attending: Gastroenterology | Admitting: Gastroenterology

## 2017-06-07 DIAGNOSIS — I8510 Secondary esophageal varices without bleeding: Secondary | ICD-10-CM | POA: Insufficient documentation

## 2017-06-07 DIAGNOSIS — K7581 Nonalcoholic steatohepatitis (NASH): Secondary | ICD-10-CM | POA: Insufficient documentation

## 2017-06-07 DIAGNOSIS — K7460 Unspecified cirrhosis of liver: Secondary | ICD-10-CM | POA: Insufficient documentation

## 2017-06-10 ENCOUNTER — Ambulatory Visit: Payer: No Typology Code available for payment source | Admitting: Gastroenterology

## 2017-06-27 ENCOUNTER — Ambulatory Visit (INDEPENDENT_AMBULATORY_CARE_PROVIDER_SITE_OTHER): Payer: Self-pay | Admitting: Internal Medicine

## 2017-06-27 ENCOUNTER — Encounter: Payer: Self-pay | Admitting: Internal Medicine

## 2017-06-27 DIAGNOSIS — Z7984 Long term (current) use of oral hypoglycemic drugs: Secondary | ICD-10-CM

## 2017-06-27 DIAGNOSIS — E118 Type 2 diabetes mellitus with unspecified complications: Secondary | ICD-10-CM

## 2017-06-27 DIAGNOSIS — K7460 Unspecified cirrhosis of liver: Secondary | ICD-10-CM

## 2017-06-27 DIAGNOSIS — E119 Type 2 diabetes mellitus without complications: Secondary | ICD-10-CM

## 2017-06-27 MED ORDER — SITAGLIPTIN PHOSPHATE 25 MG PO TABS: 25.0000 mg | ORAL_TABLET | Freq: Every day | ORAL | 2 refills | Status: DC

## 2017-06-27 NOTE — Patient Instructions (Signed)
Ms. Aoun,  Por favor empieze a tomar Januvia y regrese en 2 meses.

## 2017-06-27 NOTE — Progress Notes (Signed)
   CC: Follow up on type 2 diabetes mellitus  HPI:  [name] is a 51 y.o. female with history noted below that presents to the internal medicine clinic for follow up on type 2 diabetes mellitus.  Please see problem based charting for the status of patient's chronic medical conditions.  Past Medical History:  Diagnosis Date  . Diabetes mellitus without complication ([place])   . NASH (nonalcoholic steatohepatitis)   . Obesity     Review of Systems:  Review of Systems  Respiratory: Negative for shortness of breath.   Cardiovascular: Negative for chest pain.  Endo/Heme/Allergies: Negative for polydipsia.     Physical Exam:  Vitals:   06/27/17 1450  BP: 125/62  Pulse: 69  Temp: 98.1 F (36.7 C)  TempSrc: Oral  SpO2: 99%  Weight: 221 lb (100.2 kg)  Height: 4' 11"  (1.499 m)   Physical Exam  Constitutional: She is well-developed, well-nourished, and in no distress.  Cardiovascular: Normal rate, regular rhythm and normal heart sounds. Exam reveals no gallop and no friction rub.  No murmur heard. Pulmonary/Chest: Effort normal and breath sounds normal. No respiratory distress. She has no wheezes. She has no rales.    Assessment & Plan:   See encounters tab for problem based medical decision making.   Patient discussed with Dr. Beryle Beams

## 2017-06-30 NOTE — Assessment & Plan Note (Addendum)
Assessment:  Type II diabetes  Patient's Hemoglobin A1C was 8.1 on 05/30/2017.  Patient's blood glucose at that time was between 120-200.   Glimepiride was increased from 16m to 280mdaily on that visit and asked patient to return in 4 weeks with glucose log.  Today patient reports glucose levels between 161-248 with average of 207 .  Due to cirrhosis patient was taken off of metformin previously (02/21/17 visit) and will not be restarted on it.  Will start januvia [id number].   Plan -hemoglobin A1C due in Jan 2019 -started janTongam39mday - continue glimepiride at 2mg 70mly

## 2017-07-01 NOTE — Progress Notes (Signed)
Medicine attending: Medical history, presenting problems, physical findings, and medications, reviewed with resident physician Dr Kalman Shan on the day of the patient visit and I concur with her evaluation and management plan.

## 2017-09-06 ENCOUNTER — Telehealth: Payer: Self-pay | Admitting: Gastroenterology

## 2017-09-06 MED ORDER — NADOLOL 20 MG PO TABS: 20.0000 mg | ORAL_TABLET | Freq: Every day | ORAL | 6 refills | Status: DC

## 2017-09-06 NOTE — Telephone Encounter (Signed)
done

## 2017-09-10 ENCOUNTER — Other Ambulatory Visit: Payer: Self-pay | Admitting: Gastroenterology

## 2017-09-10 NOTE — Telephone Encounter (Signed)
Refill request for nadolol 20 mg once a day. Last seen 05-2017. Follow up 11-2017.

## 2017-09-11 NOTE — Progress Notes (Signed)
   CC: Follow up on type II diabetes  HPI:  [name] is a 52 y.o. female with history noted below that presents to the Internal Medicine Clinic for follow up on type II diabetes.  Please see problem based charting for the status of patient's chronic medical conditions.    Past Medical History:  Diagnosis Date  . Diabetes mellitus without complication ([place])   . NASH (nonalcoholic steatohepatitis)   . Obesity     Review of Systems:  Review of Systems  Respiratory: Negative for cough and shortness of breath.   Cardiovascular: Negative for chest pain.  Gastrointestinal: Negative for nausea and vomiting.  Genitourinary: Negative for frequency.  Endo/Heme/Allergies: Negative for polydipsia.    Physical Exam:  Vitals:   09/12/17 1526  BP: (!) 127/57  Pulse: 69  Temp: 97.8 F (36.6 C)  TempSrc: Oral  SpO2: 98%  Weight: 228 lb (103.4 kg)  Height: 4' 11"  (1.499 m)   Physical Exam  Constitutional: She is well-developed, well-nourished, and in no distress.  Cardiovascular: Normal rate, regular rhythm and normal heart sounds. Exam reveals no gallop and no friction rub.  No murmur heard. Pulmonary/Chest: Effort normal and breath sounds normal. No respiratory distress. She has no wheezes. She has no rales.     Assessment & Plan:   See encounters tab for problem based medical decision making.   Patient discussed with Dr. Daryll Drown

## 2017-09-12 ENCOUNTER — Ambulatory Visit: Payer: Self-pay | Admitting: Internal Medicine

## 2017-09-12 ENCOUNTER — Encounter: Payer: Self-pay | Admitting: Internal Medicine

## 2017-09-12 VITALS — BP 127/57 | HR 69 | Temp 97.8°F | Ht 59.0 in | Wt 228.0 lb

## 2017-09-12 DIAGNOSIS — E119 Type 2 diabetes mellitus without complications: Secondary | ICD-10-CM

## 2017-09-12 DIAGNOSIS — Z23 Encounter for immunization: Secondary | ICD-10-CM

## 2017-09-12 DIAGNOSIS — Z0000 Encounter for general adult medical examination without abnormal findings: Secondary | ICD-10-CM

## 2017-09-12 DIAGNOSIS — Z7984 Long term (current) use of oral hypoglycemic drugs: Secondary | ICD-10-CM

## 2017-09-12 DIAGNOSIS — E118 Type 2 diabetes mellitus with unspecified complications: Secondary | ICD-10-CM

## 2017-09-12 LAB — GLUCOSE, CAPILLARY: GLUCOSE-CAPILLARY: 390 mg/dL — AB (ref 65–99)

## 2017-09-12 LAB — POCT GLYCOSYLATED HEMOGLOBIN (HGB A1C): Hemoglobin A1C: 8

## 2017-09-12 MED ORDER — EMPAGLIFLOZIN 10 MG PO TABS: 10.0000 mg | ORAL_TABLET | Freq: Every day | ORAL | 3 refills | Status: DC

## 2017-09-12 NOTE — Patient Instructions (Signed)
Ms. Tangeman,  Por favor empieze a tomar Jardiance 61m una vez al dia.  Por favor regrese en 3 meses

## 2017-09-16 DIAGNOSIS — Z0000 Encounter for general adult medical examination without abnormal findings: Secondary | ICD-10-CM | POA: Insufficient documentation

## 2017-09-16 NOTE — Assessment & Plan Note (Signed)
Assessment:  Type II diabetes Patient is currently taking glimepiride 29m daily.  She was taking  januvia 220mhowever stopped 2 weeks ago due to cough that she attributed to a side effect of the medication.  Today's hemoglobin A1C is 8.0, previously 8.1.  [place] start [place] stop jaTong[place] Plan -start em[place]

## 2017-09-16 NOTE — Assessment & Plan Note (Signed)
Assessment:  Healthcare maintenance Pneumococcal vaccine due and offered in office.  Patient agreeable.  Plan -pneumococcal vaccine in office

## 2017-09-17 NOTE — Progress Notes (Signed)
Internal Medicine Clinic Attending  Case discussed with Dr. Hoffman at the time of the visit.  We reviewed the resident's history and exam and pertinent patient test results.  I agree with the assessment, diagnosis, and plan of care documented in the resident's note.  

## 2017-09-20 ENCOUNTER — Telehealth: Payer: Self-pay | Admitting: Internal Medicine

## 2017-09-20 NOTE — Telephone Encounter (Signed)
NEW MEDICATION SHE WAS GIVEN COST IS $500.00, CAN NOT AFFORD, NEED TO CHANGE, [place]

## 2017-09-21 ENCOUNTER — Other Ambulatory Visit: Payer: Self-pay | Admitting: Internal Medicine

## 2017-09-21 MED ORDER — EMPAGLIFLOZIN 10 MG PO TABS: 10.0000 mg | ORAL_TABLET | Freq: Every day | ORAL | 3 refills | Status: DC

## 2017-09-21 NOTE — Telephone Encounter (Signed)
Ok sent to [facility]

## 2017-09-24 ENCOUNTER — Telehealth: Payer: Self-pay | Admitting: Internal Medicine

## 2017-09-24 ENCOUNTER — Telehealth: Payer: Self-pay | Admitting: Pharmacist

## 2017-09-24 NOTE — Telephone Encounter (Signed)
MEDICATION FOR DIABETES IS $500.00, CAN NOT AFFORD, WANTS TO CHANGE TO CHEAPER MEDICATION.

## 2017-09-24 NOTE — Telephone Encounter (Signed)
Januvia is $400 even if she is part of the [facility] program?

## 2017-09-24 NOTE — Telephone Encounter (Signed)
Talked with Benjamine Mola - stated incorrect quote. Under the IM program - cost is $4.00 and they are currently working on filling med.

## 2017-09-24 NOTE — Telephone Encounter (Addendum)
Called pt - no answer; mailbox full. Called [facility] - stated Januvia cost $400 and Jardiance cost $358. Send to her doctor also Christene Slates and [employer].

## 2017-09-24 NOTE — Telephone Encounter (Signed)
I called outpatient pharmacy- the jardiance is 4$ for Deborah Pham and they stopped Januvia ( It was a misunderstanding, they thought they were being asked for the cash price).

## 2017-09-24 NOTE — Telephone Encounter (Signed)
Tried to call patient 4 times on all 3 numbers using pacific interpreters, unable to leave a message on first two phone numbers and her work # is disconnected.

## 2017-09-25 NOTE — Progress Notes (Addendum)
Tried contacting patient for medication help, unable to reach

## 2017-10-27 IMAGING — DX DG CHEST 2V
2 series · 2 of 2 positions shown · non-contrast
Comparison: None.

CLINICAL DATA: Cough and shortness of breath for 2 days.

EXAM:
CHEST  2 VIEW

[w chest pa]
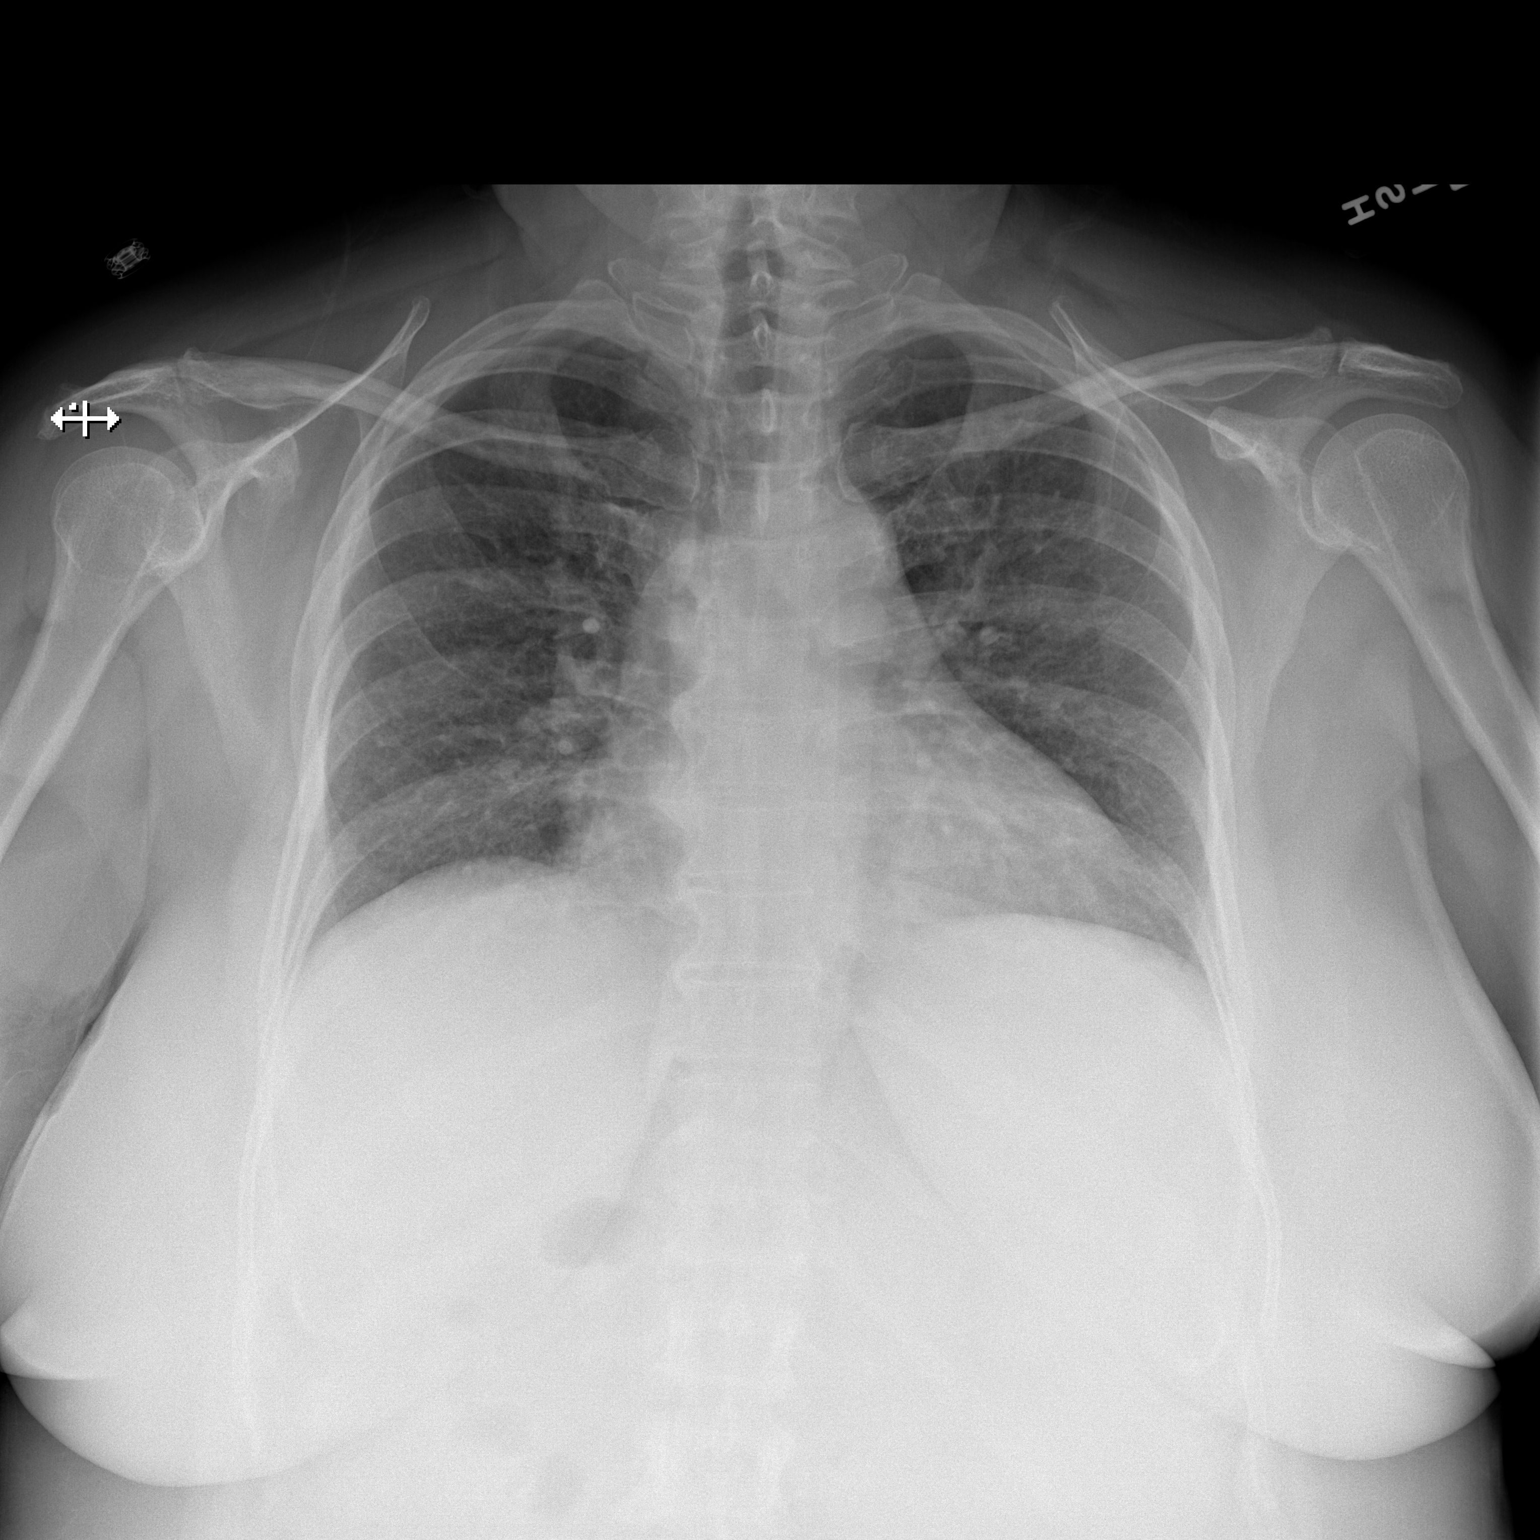

[w chest lat]
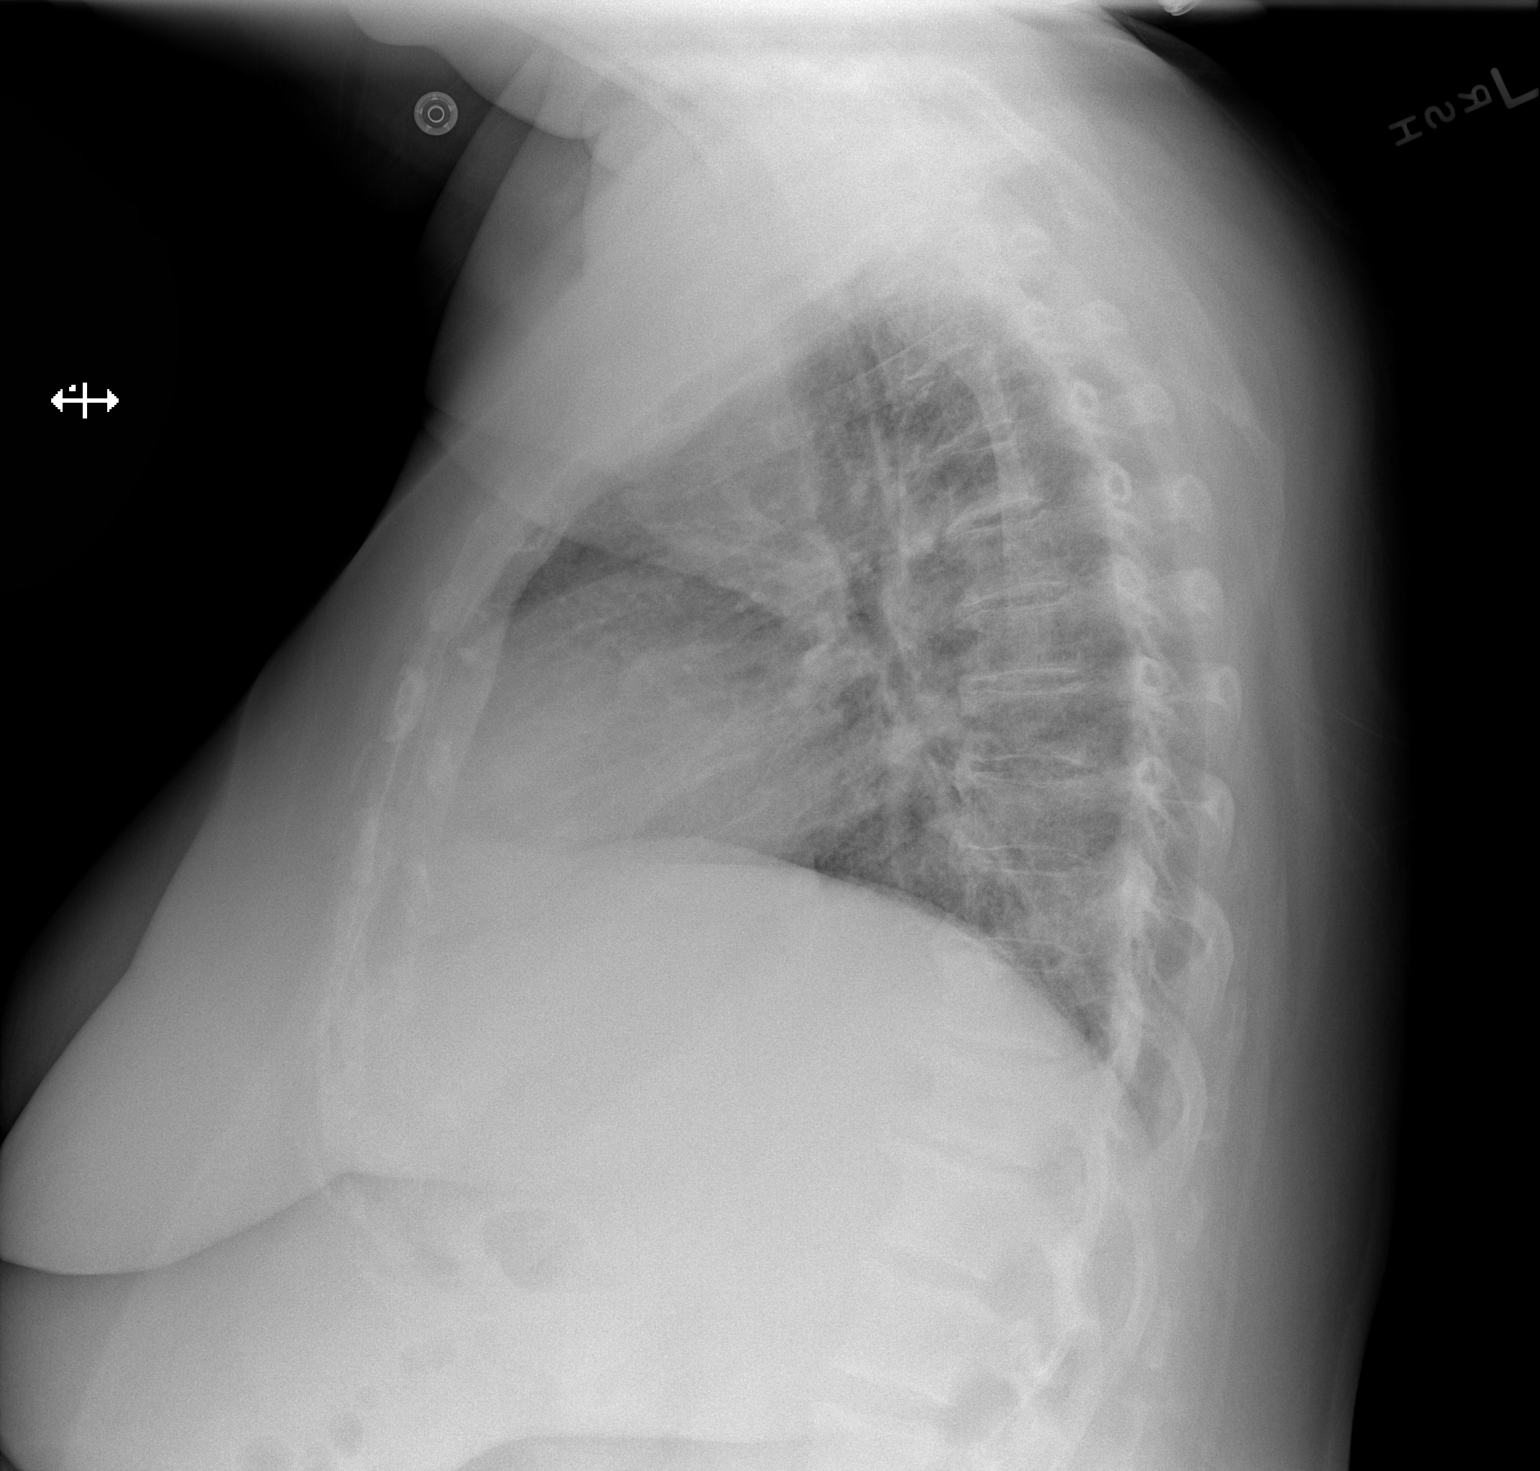

[2 of 2 positions shown; findings below may reference images not displayed]

FINDINGS: The cardiomediastinal silhouette is unremarkable.

Peribronchial thickening is present.

There is no evidence of focal airspace disease, pulmonary edema,
suspicious pulmonary nodule/mass, pleural effusion, or pneumothorax.
No acute bony abnormalities are identified.
IMPRESSION: Peribronchial thickening/bronchitis changes of uncertain chronicity.
No evidence of focal pneumonia.

## 2017-11-15 LAB — HM DIABETES EYE EXAM

## 2017-11-25 ENCOUNTER — Other Ambulatory Visit (INDEPENDENT_AMBULATORY_CARE_PROVIDER_SITE_OTHER): Payer: Self-pay

## 2017-11-25 ENCOUNTER — Ambulatory Visit (INDEPENDENT_AMBULATORY_CARE_PROVIDER_SITE_OTHER): Payer: Self-pay | Admitting: Gastroenterology

## 2017-11-25 ENCOUNTER — Encounter (INDEPENDENT_AMBULATORY_CARE_PROVIDER_SITE_OTHER): Payer: Self-pay

## 2017-11-25 ENCOUNTER — Encounter: Payer: Self-pay | Admitting: Gastroenterology

## 2017-11-25 VITALS — BP 118/76 | HR 80 | Ht 59.0 in | Wt 227.0 lb

## 2017-11-25 DIAGNOSIS — K7581 Nonalcoholic steatohepatitis (NASH): Secondary | ICD-10-CM

## 2017-11-25 DIAGNOSIS — I8510 Secondary esophageal varices without bleeding: Secondary | ICD-10-CM

## 2017-11-25 DIAGNOSIS — R7989 Other specified abnormal findings of blood chemistry: Secondary | ICD-10-CM

## 2017-11-25 DIAGNOSIS — K7460 Unspecified cirrhosis of liver: Secondary | ICD-10-CM

## 2017-11-25 DIAGNOSIS — R945 Abnormal results of liver function studies: Secondary | ICD-10-CM

## 2017-11-25 LAB — COMPREHENSIVE METABOLIC PANEL
ALBUMIN: 3.2 g/dL — AB (ref 3.5–5.2)
ALT: 28 U/L (ref 0–35)
AST: 39 U/L — AB (ref 0–37)
Alkaline Phosphatase: 158 U/L — ABNORMAL HIGH (ref 39–117)
BILIRUBIN TOTAL: 1.7 mg/dL — AB (ref 0.2–1.2)
BUN: 9 mg/dL (ref 6–23)
CALCIUM: 8.6 mg/dL (ref 8.4–10.5)
CHLORIDE: 106 meq/L (ref 96–112)
CO2: 23 meq/L (ref 19–32)
CREATININE: 0.52 mg/dL (ref 0.40–1.20)
GFR: 131.13 mL/min (ref >60.00)
Glucose, Bld: 292 mg/dL — ABNORMAL HIGH (ref 70–99)
Potassium: 3.8 mEq/L (ref 3.5–5.1)
SODIUM: 135 meq/L (ref 135–145)
Total Protein: 7.3 g/dL (ref 6.0–8.3)

## 2017-11-25 LAB — PROTIME-INR
INR: 1.1 ratio — AB (ref 0.8–1.0)
Prothrombin Time: 12.3 s (ref 9.6–13.1)

## 2017-11-25 NOTE — Progress Notes (Signed)
Deborah Pham  Chief Complaint: Cirrhosis  Subjective  History:  This is six-month follow-up for a 52 year old woman with Deborah Pham related cirrhosis.  She has esophageal varices on primary prophylaxis with nadolol.  She also has diabetes managed by the internal medicine clinic at [facility], and I reviewed the last Pham from 09/22/2017, at which time her diabetic medicine was changed and she was given a pneumococcal vaccine.  Last screening hepatic ultrasound October 2018 showed no mass/ She was seen with a Spanish interpreter today.[name] reports no abdominal pain, nausea, vomiting, change in bowel habits, hematemesis or rectal bleeding.  She is having some left knee pain for the last few weeks, and is scheduled to see primary care this week.  She also asked if she might be considered for disability, and I referred her to primary care for this.  ROS: Cardiovascular:  no chest pain Respiratory: no dyspnea  The patient's Past Medical, Family and Social History were reviewed and are on file in the EMR.  Objective:  Med list reviewed  Current Outpatient Medications:  .  glimepiride (AMARYL) 2 MG tablet, Take 1 tablet (2 mg total) by mouth every morning., Disp: 30 tablet, Rfl: 11 .  nadolol (CORGARD) 20 MG tablet, TAKE ONE TABLET BY MOUTH DAILY, Disp: 30 tablet, Rfl: 5   Vital signs in last 24 hrs: Vitals:   11/25/17 1037  BP: 118/76  Pulse: 80  pulse check 72  Physical Exam   HEENT: sclera anicteric, oral mucosa moist without lesions  Neck: supple, no thyromegaly, JVD or lymphadenopathy  Cardiac: RRR without murmurs, S1S2 heard, no peripheral edema  Pulm: clear to auscultation bilaterally, normal RR and effort noted  Abdomen: soft, obese, no tenderness, with active bowel sounds. No guarding or palpable hepatosplenomegaly, exam limited by body habitus  Skin; warm and dry, no jaundice or rash  Recent Labs:  CBC Latest Ref Rng & Units 02/21/2017  06/23/2016 06/22/2016  WBC 3.4 - 10.8 x10E3/uL 4.6 12.6(H) 16.6(H)  Hemoglobin 11.1 - 15.9 g/dL 14.9 14.0 13.0  Hematocrit 34.0 - 46.6 % 40.4 38.3 35.0(L)  Platelets 150 - 379 x10E3/uL 96(LL) 127(L) 94(L)   CMP Latest Ref Rng & Units 11/25/2017 02/21/2017 06/23/2016  Glucose 70 - 99 mg/dL 292(H) 228(H) 218(H)  BUN 6 - 23 mg/dL 9 7 11   Creatinine 0.40 - 1.20 mg/dL 0.52 0.69 0.97  Sodium 135 - 145 mEq/L 135 141 133(L)  Potassium 3.5 - 5.1 mEq/L 3.8 3.7 3.4(L)  Chloride 96 - 112 mEq/L 106 104 103  CO2 19 - 32 mEq/L 23 22 21(L)  Calcium 8.4 - 10.5 mg/dL 8.6 8.9 7.6(L)  Total Protein 6.0 - 8.3 g/dL 7.3 6.9 6.7  Total Bilirubin 0.2 - 1.2 mg/dL 1.7(H) 0.9 3.5(H)  Alkaline Phos 39 - 117 U/L 158(H) 152(H) 182(H)  AST 0 - 37 U/L 39(H) 42(H) 46(H)  ALT 0 - 35 U/L 28 23 24    Last AFP 7.4 and INR 1.1 in June, 2018  Hgb A1C 8.0 on 09/12/17  Radiologic studies: Ultrasound October 2018 no liver mass is noted above   @ASSESSMENTPLANBEGIN @ Assessment: Encounter Diagnoses  Name Primary?  . Liver cirrhosis secondary to NASH ([place]) Yes  . Secondary esophageal varices without bleeding ([place])   . Elevated LFTs     Her liver disease has been apparently stable.  No signs of encephalopathy, no peripheral edema, no history of GI bleeding.  It appears her diabetes could still be under some better control. I  do not think her blood pressure will tolerate an increase of the nadolol dose.  Plan:  Updated labs with CMP, prothrombin time, AFP Screening ultrasound scheduled We will contact her with results and she will see me in 6 months or sooner as needed.  Total time 20 minutes, over half spent in counseling and coordination of care.   Spanish interpreter [name]) and a female family member present for entire encounter  [name]

## 2017-11-25 NOTE — Patient Instructions (Signed)
If you are age 53 or older, your body mass index should be between 23-30. Your Body mass index is 45.85 kg/m. If this is out of the aforementioned range listed, please consider follow up with your Primary Care Provider.  If you are age 23 or younger, your body mass index should be between 19-25. Your Body mass index is 45.85 kg/m. If this is out of the aformentioned range listed, please consider follow up with your Primary Care Provider.   Your physician has requested that you go to the basement for lab work before leaving today.  You have been scheduled for an abdominal ultrasound at [facility] (1st floor of hospital) on 11/29/2017 at 11:30am. Please arrive 15 minutes prior to your appointment for registration. Make certain not to have anything to eat or drink 6 hours prior to your appointment. Should you need to reschedule your appointment, please contact radiology at [phone]. This test typically takes about 30 minutes to perform.  Thank you for choosing [facility]  Dr Wilfrid Lund III

## 2017-11-26 ENCOUNTER — Encounter: Payer: Self-pay | Admitting: *Deleted

## 2017-11-26 LAB — AFP TUMOR MARKER: AFP TUMOR MARKER: 9.5 ng/mL — AB

## 2017-11-27 NOTE — Progress Notes (Signed)
   CC: follow-up on type 2 diabetes  HPI:  Deborah Pham is a 52 y.o. female with history noted below that presents to the Internal medicine clinic for follow-up on type 2 diabetes. Please see problem based charting for the status of patient's chronic medical conditions.  Past Medical History:  Diagnosis Date  . Diabetes mellitus without complication ([place])   . NASH (nonalcoholic steatohepatitis)   . Obesity     Review of Systems:  Review of Systems  Constitutional: Negative for malaise/fatigue.  Respiratory: Negative for shortness of breath.   Cardiovascular: Negative for chest pain.  Musculoskeletal: Positive for joint pain.     Physical Exam:  Vitals:   11/28/17 1420  BP: (!) 125/56  Pulse: 67  Temp: 97.6 F (36.4 C)  TempSrc: Oral  SpO2: 99%  Weight: 228 lb 14.4 oz (103.8 kg)  Height: 4' 11"  (1.499 m)   Physical Exam  Constitutional: She is well-developed, well-nourished, and in no distress.  Cardiovascular: Normal rate, regular rhythm and normal heart sounds. Exam reveals no gallop and no friction rub.  No murmur heard. Pulmonary/Chest: Effort normal and breath sounds normal. No respiratory distress. She has no wheezes. She has no rales.  Musculoskeletal: She exhibits no edema.  Crepitus noted on extension and flexion of left knee.  No effusion palpated.  No erythema noted  Skin: Skin is warm and dry.     Assessment & Plan:   See encounters tab for problem based medical decision making.   Patient discussed with Dr. Beryle Beams

## 2017-11-28 ENCOUNTER — Encounter: Payer: Self-pay | Admitting: Internal Medicine

## 2017-11-28 ENCOUNTER — Ambulatory Visit (INDEPENDENT_AMBULATORY_CARE_PROVIDER_SITE_OTHER): Payer: Self-pay | Admitting: Internal Medicine

## 2017-11-28 VITALS — BP 125/56 | HR 67 | Temp 97.6°F | Ht 59.0 in | Wt 228.9 lb

## 2017-11-28 DIAGNOSIS — E118 Type 2 diabetes mellitus with unspecified complications: Secondary | ICD-10-CM

## 2017-11-28 DIAGNOSIS — K7460 Unspecified cirrhosis of liver: Secondary | ICD-10-CM

## 2017-11-28 DIAGNOSIS — Z6841 Body Mass Index (BMI) 40.0 and over, adult: Secondary | ICD-10-CM

## 2017-11-28 DIAGNOSIS — Z7984 Long term (current) use of oral hypoglycemic drugs: Secondary | ICD-10-CM

## 2017-11-28 DIAGNOSIS — M1712 Unilateral primary osteoarthritis, left knee: Secondary | ICD-10-CM

## 2017-11-28 DIAGNOSIS — Z791 Long term (current) use of non-steroidal anti-inflammatories (NSAID): Secondary | ICD-10-CM

## 2017-11-28 DIAGNOSIS — E1169 Type 2 diabetes mellitus with other specified complication: Secondary | ICD-10-CM

## 2017-11-28 DIAGNOSIS — R635 Abnormal weight gain: Secondary | ICD-10-CM

## 2017-11-28 LAB — GLUCOSE, CAPILLARY: GLUCOSE-CAPILLARY: 222 mg/dL — AB (ref 65–99)

## 2017-11-28 LAB — POCT GLYCOSYLATED HEMOGLOBIN (HGB A1C): Hemoglobin A1C: 8.7

## 2017-11-28 MED ORDER — DICLOFENAC SODIUM 50 MG PO TBEC: 50.0000 mg | DELAYED_RELEASE_TABLET | Freq: Two times a day (BID) | ORAL | 0 refills | Status: DC

## 2017-11-28 MED ORDER — EMPAGLIFLOZIN 10 MG PO TABS: 10.0000 mg | ORAL_TABLET | Freq: Every day | ORAL | 11 refills | Status: DC

## 2017-11-28 NOTE — Patient Instructions (Signed)
Deborah Pham,  Fue un placer verte hoy.  Por favor tome empaglaflozin 18m cada dia.  Por favor regrese en 3 meses.

## 2017-11-29 ENCOUNTER — Ambulatory Visit (HOSPITAL_COMMUNITY)
Admission: RE | Admit: 2017-11-29 | Discharge: 2017-11-29 | Disposition: A | Discharge status: Home / Self Care | Payer: Self-pay | Source: Ambulatory Visit | Attending: Gastroenterology | Admitting: Gastroenterology

## 2017-11-29 DIAGNOSIS — I8510 Secondary esophageal varices without bleeding: Secondary | ICD-10-CM | POA: Insufficient documentation

## 2017-11-29 DIAGNOSIS — R945 Abnormal results of liver function studies: Secondary | ICD-10-CM

## 2017-11-29 DIAGNOSIS — K7581 Nonalcoholic steatohepatitis (NASH): Secondary | ICD-10-CM | POA: Insufficient documentation

## 2017-11-29 DIAGNOSIS — R7989 Other specified abnormal findings of blood chemistry: Secondary | ICD-10-CM | POA: Insufficient documentation

## 2017-11-29 DIAGNOSIS — K7460 Unspecified cirrhosis of liver: Secondary | ICD-10-CM | POA: Insufficient documentation

## 2017-11-29 LAB — TSH: TSH: 1.4 u[IU]/mL (ref 0.450–4.500)

## 2017-12-01 DIAGNOSIS — M1990 Unspecified osteoarthritis, unspecified site: Secondary | ICD-10-CM | POA: Insufficient documentation

## 2017-12-01 NOTE — Assessment & Plan Note (Signed)
Assessment: Osteoarthritis of left knee Patient reports a history of left knee pain. She states that she has had diclofenac pills in the past for this issue. On exam there is crepitus noted with flexion and extension of the knee. No effusion or erythema noted. Will try a trial of diclofenac.  Plan -diclofenac

## 2017-12-01 NOTE — Assessment & Plan Note (Signed)
Assessment:  Type II diabetes Patient is currently taking glimepiride 54m daily.  Today's hemoglobin A1C is 8.7, previously 8.0.  Due to cirrhosis patient was taken off of metformin previously (02/21/17 visit) and will not be restarted on it.   I added empagliflozin at previous clinic visit but unfortunately due to communication breakdown patient did not start.  Will resend prescription and call patient next week to ensure she was able to obtain the prescription.  Also reports weight gain.  Will obtain a TSH  Plan - start empagliflozin  - TSH

## 2017-12-02 NOTE — Progress Notes (Signed)
Medicine attending: Medical history, presenting problems, physical findings, and medications, reviewed with resident physician Dr Kalman Shan on the day of the patient visit and I concur with her evaluation and management plan.

## 2017-12-05 ENCOUNTER — Telehealth: Payer: Self-pay | Admitting: Gastroenterology

## 2017-12-05 NOTE — Telephone Encounter (Addendum)
Deborah Pham called back and I spoke to her. I told her that ultrasound showed no mass in spanish. She was happy with the great news and had no other questions or concerns. Advised her that she needs to follow up in six months. I placed a reminder in the system so we can reach out to her in October 2019. Advised her to call back later if she thought of  any other questions or concerns.

## 2017-12-06 ENCOUNTER — Ambulatory Visit: Payer: Self-pay | Admitting: Gastroenterology

## 2017-12-09 ENCOUNTER — Telehealth: Payer: Self-pay | Admitting: Internal Medicine

## 2017-12-09 NOTE — Telephone Encounter (Signed)
Request sent to her PCP.

## 2017-12-09 NOTE — Telephone Encounter (Signed)
PATIENT WANTS CALL FOR LAB RESULTS, NEEDS TO CHANGE HER MEDICATION, CAN NOT AFFORD

## 2017-12-11 ENCOUNTER — Other Ambulatory Visit: Payer: Self-pay

## 2017-12-11 DIAGNOSIS — E118 Type 2 diabetes mellitus with unspecified complications: Secondary | ICD-10-CM

## 2017-12-11 NOTE — Telephone Encounter (Signed)
Pt states empagliflozin (JARDIANCE) 10 MG TABS tablet is too expensive. Requesting to speak with a nurse.

## 2017-12-16 ENCOUNTER — Telehealth: Payer: Self-pay | Admitting: *Deleted

## 2017-12-16 MED ORDER — EMPAGLIFLOZIN 10 MG PO TABS: 10.0000 mg | ORAL_TABLET | Freq: Every day | ORAL | 11 refills | Status: DC

## 2017-12-16 NOTE — Telephone Encounter (Signed)
I am going to try to refer patient for free Jardiance from [facility] Department. I tried calling patient but had to leave a message.  Thanks

## 2017-12-16 NOTE — Telephone Encounter (Signed)
[phone], Deborah Pham, please call [facility], some questions

## 2017-12-17 NOTE — Telephone Encounter (Signed)
Thank you Bonnita Nasuti! I called back but had to leave a message

## 2018-01-07 LAB — HM DIABETES EYE EXAM

## 2018-01-10 NOTE — Progress Notes (Unsigned)
Per [facility] Department, patient has not followed up with them for Jardiance. Left a message, via interpreter [phone] to call us back.   We need updated financial paperwork to complete medication assistance application.

## 2018-01-17 ENCOUNTER — Encounter: Payer: Self-pay | Admitting: *Deleted

## 2018-01-23 ENCOUNTER — Telehealth: Payer: Self-pay | Admitting: Pharmacist

## 2018-01-23 NOTE — Progress Notes (Signed)
Tried to contact patient for help with medication access. Unable to reach.

## 2018-01-23 NOTE — Telephone Encounter (Signed)
Dr. [name] has tried to contact patient to help with financial assistance for medication.  Patient needs to fill out paper work for health department to obtain medications.  Dr. [name] anything else we need to do?  Thanks!

## 2018-01-23 NOTE — Telephone Encounter (Signed)
Hi Dr. Heber Bearcreek, patient was supposed to follow up with Marlana Latus and [place] but has not followed up. I will try to contact patient.

## 2018-01-24 NOTE — Telephone Encounter (Signed)
Thank you Dr. [name]

## 2018-03-06 ENCOUNTER — Other Ambulatory Visit: Payer: Self-pay | Admitting: Gastroenterology

## 2018-03-06 ENCOUNTER — Encounter: Payer: Self-pay | Admitting: Internal Medicine

## 2018-03-06 NOTE — Telephone Encounter (Signed)
Last seen 11-25-2017.

## 2018-03-14 ENCOUNTER — Telehealth: Payer: Self-pay | Admitting: Student-PharmD

## 2018-03-14 NOTE — Telephone Encounter (Signed)
Tried to contact patient for help with medication access. Unable to reach; left message to call back.

## 2018-03-20 NOTE — Telephone Encounter (Signed)
Spoke to Ms. Flores-Crickenberger about access to medications. She reports being unable to afford medications and has not been taking them reliably.   Informed patient about need for financial documents for enrollment in patient assistance programs; patient willing to collect all necessary documents. Will wait until upcoming appointment to determine which documents are necessary.  Appointment made for patient to see Mannie Stabile, PharmD on August 2nd to help with medication samples and enrollment in program for medication access.  Oralia Manis, PharmD Candidate

## 2018-03-28 ENCOUNTER — Ambulatory Visit: Payer: Self-pay | Admitting: Pharmacist

## 2018-03-28 ENCOUNTER — Other Ambulatory Visit: Payer: Self-pay | Admitting: Obstetrics and Gynecology

## 2018-03-28 DIAGNOSIS — Z1231 Encounter for screening mammogram for malignant neoplasm of breast: Secondary | ICD-10-CM

## 2018-03-28 DIAGNOSIS — K7469 Other cirrhosis of liver: Secondary | ICD-10-CM

## 2018-03-28 DIAGNOSIS — E118 Type 2 diabetes mellitus with unspecified complications: Secondary | ICD-10-CM

## 2018-03-28 MED ORDER — CANAGLIFLOZIN 100 MG PO TABS: 100.0000 mg | ORAL_TABLET | Freq: Every day | ORAL | 3 refills | Status: DC

## 2018-03-28 MED ORDER — GLIMEPIRIDE 2 MG PO TABS: 2.0000 mg | ORAL_TABLET | ORAL | 11 refills | Status: DC

## 2018-03-28 MED ORDER — NADOLOL 20 MG PO TABS: 20.0000 mg | ORAL_TABLET | Freq: Every day | ORAL | 4 refills | Status: DC

## 2018-03-28 NOTE — Progress Notes (Signed)
S: Deborah Pham is a 52 y.o. female reports to clinical pharmacist appointment for medication help No Known Allergies   Medication Sig  canagliflozin (INVOKANA) 100 MG TABS tablet Take 1 tablet (100 mg total) by mouth daily before breakfast.  glimepiride (AMARYL) 2 MG tablet Take 1 tablet (2 mg total) by mouth every morning. With breakfast  nadolol (CORGARD) 20 MG tablet Take 1 tablet (20 mg total) by mouth daily.   Past Medical History:  Diagnosis Date  . Diabetes mellitus without complication ([place])   . NASH (nonalcoholic steatohepatitis)   . Obesity    Social History   Socioeconomic History  . Marital status: Married    Spouse name: Not on file  . Number of children: 2  . Years of education: Not on file  . Highest education level: Not on file  Occupational History  . Occupation: housewife  Social Needs  . Financial resource strain: Not on file  . Food insecurity:    Worry: Not on file    Inability: Not on file  . Transportation needs:    Medical: Not on file    Non-medical: Not on file  Tobacco Use  . Smoking status: Never Smoker  . Smokeless tobacco: Never Used  Substance and Sexual Activity  . Alcohol use: No  . Drug use: No  . Sexual activity: Yes    Birth control/protection: None  Lifestyle  . Physical activity:    Days per week: Not on file    Minutes per session: Not on file  . Stress: Not on file  Relationships  . Social connections:    Talks on phone: Not on file    Gets together: Not on file    Attends religious service: Not on file    Active member of club or organization: Not on file    Attends meetings of clubs or organizations: Not on file    Relationship status: Not on file  Other Topics Concern  . Not on file  Social History Narrative  . Not on file   Family History  Problem Relation Age of Onset  . Liver disease Father   . Alcoholism Father   . Diabetes Mother   . Colon cancer Neg Hx   . Breast cancer Neg Hx     O: Component Value Date/Time   CHOL 162 06/18/2008 1528   HDL 70 06/18/2008 1528   TRIG 124 06/18/2008 1528   AST 39 (H) 11/25/2017 1139   ALT 28 11/25/2017 1139   NA 135 11/25/2017 1139   NA 141 02/21/2017 1603   K 3.8 11/25/2017 1139   CL 106 11/25/2017 1139   CO2 23 11/25/2017 1139   GLUCOSE 292 (H) 11/25/2017 1139   HGBA1C 8.7 11/28/2017 1408   HGBA1C 8.9 (H) 06/22/2016 0918   BUN 9 11/25/2017 1139   BUN 7 02/21/2017 1603   CREATININE 0.52 11/25/2017 1139   CALCIUM 8.6 11/25/2017 1139   GFRNONAA 100 02/21/2017 1603   GFRAA 116 02/21/2017 1603   WBC 4.6 02/21/2017 1603   WBC 12.6 (H) 06/23/2016 0543   HGB 14.9 02/21/2017 1603   HCT 40.4 02/21/2017 1603   PLT 96 (LL) 02/21/2017 1603   TSH 1.400 11/28/2017 1534   Ht Readings from Last 2 Encounters:  11/28/17 4' 11"  (1.499 m)  11/25/17 4' 11"  (1.499 m)   Wt Readings from Last 2 Encounters:  11/28/17 228 lb 14.4 oz (103.8 kg)  11/25/17 227 lb (103 kg)   There is no  height or weight on file to calculate BMI. BP Readings from Last 3 Encounters:  11/28/17 (!) 125/56  11/25/17 118/76  09/12/17 (!) 127/57   A/P: Patient mainly needs assistance with empagliflozin today, however upon further review, patient income is above eligibility threshold. However, cash price of empagliflozin is impractical. Transferred prescriptions to [facility] for IM program and interchanged empacliflozin to canagliflozin based on formulary.  Worked with interpreter service, Allena Katz [phone] and Sheppard Coil [phone].   The patient verbalized understanding of information provided by repeating back concepts discussed.

## 2018-04-01 ENCOUNTER — Encounter: Payer: Self-pay | Admitting: Internal Medicine

## 2018-04-17 ENCOUNTER — Ambulatory Visit: Payer: Self-pay | Admitting: Internal Medicine

## 2018-04-17 VITALS — BP 117/56 | HR 69 | Temp 98.2°F | Wt 227.8 lb

## 2018-04-17 DIAGNOSIS — E119 Type 2 diabetes mellitus without complications: Secondary | ICD-10-CM

## 2018-04-17 DIAGNOSIS — Z7984 Long term (current) use of oral hypoglycemic drugs: Secondary | ICD-10-CM

## 2018-04-17 DIAGNOSIS — Z23 Encounter for immunization: Secondary | ICD-10-CM

## 2018-04-17 DIAGNOSIS — E118 Type 2 diabetes mellitus with unspecified complications: Secondary | ICD-10-CM

## 2018-04-17 LAB — POCT GLYCOSYLATED HEMOGLOBIN (HGB A1C): Hemoglobin A1C: 6.9 % — AB (ref 4.0–5.6)

## 2018-04-17 LAB — GLUCOSE, CAPILLARY: Glucose-Capillary: 216 mg/dL — ABNORMAL HIGH (ref 70–99)

## 2018-04-17 NOTE — Progress Notes (Signed)
   CC: Follow up on type II diabetes mellitus  HPI:  [name] is a 52 y.o. female with history noted below that presents to the Internal Medicine Clinic for follow up on type II diabetes mellitus.  Please see problem based charting for the status of patient's chronic medical conditions.  Past Medical History:  Diagnosis Date  . Diabetes mellitus without complication ([place])   . NASH (nonalcoholic steatohepatitis)   . Obesity     Review of Systems:  Review of Systems  Respiratory: Negative for shortness of breath.   Cardiovascular: Negative for chest pain.     Physical Exam:  Vitals:   04/17/18 1527  BP: (!) 117/56  Pulse: 69  Temp: 98.2 F (36.8 C)  TempSrc: Oral  SpO2: 97%  Weight: 227 lb 12.8 oz (103.3 kg)   Physical Exam  Constitutional: She is well-developed, well-nourished, and in no distress.  Cardiovascular: Normal rate, regular rhythm and normal heart sounds. Exam reveals no gallop and no friction rub.  No murmur heard. Pulmonary/Chest: Effort normal and breath sounds normal. No respiratory distress. She has no wheezes. She has no rales.     Assessment & Plan:   See encounters tab for problem based medical decision making.   Patient discussed with Dr. Beryle Beams

## 2018-04-17 NOTE — Patient Instructions (Signed)
Deborah Pham,  Fue un placer en verte.  Por favor regrese en 3 meses.

## 2018-04-20 NOTE — Assessment & Plan Note (Signed)
HPI:  Patient is taking glimepiride 27m daily without issues.  Patient was started on invokana one month ago.  Patient states that due to reports of amputations she will not take the medication.  She states she has made changes to her diet by reducing carbs and sugars.  Assessment:  Type II diabetes Hemoglobin A1C is 6.9, stable and controlled.    Plan -continue glimepiride 247m

## 2018-04-21 NOTE — Progress Notes (Signed)
Medicine attending: Medical history, presenting problems, physical findings, and medications, reviewed with resident physician Dr Kalman Shan on the day of the patient visit and I concur with her evaluation and management plan.

## 2018-05-29 ENCOUNTER — Ambulatory Visit
Admission: RE | Admit: 2018-05-29 | Discharge: 2018-05-29 | Disposition: A | Discharge status: Home / Self Care | Payer: No Typology Code available for payment source | Source: Ambulatory Visit | Attending: Obstetrics and Gynecology | Admitting: Obstetrics and Gynecology

## 2018-05-29 ENCOUNTER — Encounter (HOSPITAL_COMMUNITY): Payer: Self-pay

## 2018-05-29 ENCOUNTER — Ambulatory Visit (HOSPITAL_COMMUNITY)
Admission: RE | Admit: 2018-05-29 | Discharge: 2018-05-29 | Disposition: A | Discharge status: Home / Self Care | Payer: Self-pay | Source: Ambulatory Visit | Attending: Obstetrics and Gynecology | Admitting: Obstetrics and Gynecology

## 2018-05-29 VITALS — BP 132/86 | Wt 227.2 lb

## 2018-05-29 DIAGNOSIS — Z1239 Encounter for other screening for malignant neoplasm of breast: Secondary | ICD-10-CM

## 2018-05-29 DIAGNOSIS — Z1231 Encounter for screening mammogram for malignant neoplasm of breast: Secondary | ICD-10-CM

## 2018-05-29 NOTE — Patient Instructions (Signed)
Explained breast self awareness with Chilton Si. Patient did not need a Pap smear today due to last Pap smear and HPV typing was 01/10/2017. Let her know BCCCP will cover Pap smears and HPV typing every 5 years unless has a history of abnormal Pap smears. Referred patient to the [facility] for a screening mammogram. Appointment scheduled for Thursday, May 29, 2018 at 1610. Let patient know the [facility] will follow up with her within the next couple weeks with results of mammogram by letter or phone. [place] verbalized understanding.  Aleighna Wojtas, Arvil Chaco, RN 3:29 PM

## 2018-05-29 NOTE — Progress Notes (Addendum)
No complaints today.   Pap Smear: Pap smear not completed today. Last Pap smear was 01/10/2017 at [facility] and normal with negative HPV. Per patient has no history of an abnormal Pap smear. Last Pap smear result is in EPIC.  Physical exam: Breasts Breasts symmetrical. No skin abnormalities bilateral breasts. No nipple retraction bilateral breasts. No nipple discharge bilateral breasts. No lymphadenopathy. No lumps palpated bilateral breasts. No complaints of pain or tenderness on exam. Referred patient to the [facility] for a screening mammogram. Appointment scheduled for Thursday, May 29, 2018 at 1610.        Pelvic/Bimanual No Pap smear completed today since last Pap smear and HPV typing was 01/10/2017. Pap smear not indicated per BCCCP guidelines.   Smoking History: Patient has never smoked.  Patient Navigation: Patient education provided. Access to services provided for patient through [facility] program. Spanish interpreter provided.   Colorectal Cancer Screening: Per patient has no history of having a colonoscopy completed. FIT Test completed 01/14/2017 and negative. No complaints today. FIT Test given to patient to complete and return to BCCCP.  Breast and Cervical Cancer Risk Assessment: Patient has no family history of breast cancer, known genetic mutations, or radiation treatment to the chest before age 38. Patient has no history of cervical dysplasia, immunocompromised, or DES exposure in-utero.  Risk Assessment    Risk Scores      05/29/2018   Last edited by: Armond Hang, LPN   5-year risk: 0.7 %   Lifetime risk: 5.9 %         Used Spanish interpreter Rudene Anda from [place].

## 2018-06-03 ENCOUNTER — Other Ambulatory Visit: Payer: Self-pay | Admitting: Internal Medicine

## 2018-06-11 ENCOUNTER — Encounter: Payer: Self-pay | Admitting: Gastroenterology

## 2018-06-11 ENCOUNTER — Ambulatory Visit (INDEPENDENT_AMBULATORY_CARE_PROVIDER_SITE_OTHER): Payer: Self-pay | Admitting: Gastroenterology

## 2018-06-11 ENCOUNTER — Other Ambulatory Visit (INDEPENDENT_AMBULATORY_CARE_PROVIDER_SITE_OTHER): Payer: Self-pay

## 2018-06-11 VITALS — BP 110/76 | HR 75 | Ht 59.0 in | Wt 229.0 lb

## 2018-06-11 DIAGNOSIS — R945 Abnormal results of liver function studies: Secondary | ICD-10-CM

## 2018-06-11 DIAGNOSIS — R635 Abnormal weight gain: Secondary | ICD-10-CM

## 2018-06-11 DIAGNOSIS — K7460 Unspecified cirrhosis of liver: Secondary | ICD-10-CM

## 2018-06-11 DIAGNOSIS — R7989 Other specified abnormal findings of blood chemistry: Secondary | ICD-10-CM

## 2018-06-11 DIAGNOSIS — I8510 Secondary esophageal varices without bleeding: Secondary | ICD-10-CM

## 2018-06-11 DIAGNOSIS — K7581 Nonalcoholic steatohepatitis (NASH): Secondary | ICD-10-CM

## 2018-06-11 DIAGNOSIS — R600 Localized edema: Secondary | ICD-10-CM

## 2018-06-11 LAB — COMPREHENSIVE METABOLIC PANEL
ALT: 23 U/L (ref 0–35)
AST: 31 U/L (ref 0–37)
Albumin: 3.4 g/dL — ABNORMAL LOW (ref 3.5–5.2)
Alkaline Phosphatase: 191 U/L — ABNORMAL HIGH (ref 39–117)
BUN: 9 mg/dL (ref 6–23)
CO2: 25 mEq/L (ref 19–32)
Calcium: 9.3 mg/dL (ref 8.4–10.5)
Chloride: 107 mEq/L (ref 96–112)
Creatinine, Ser: 0.56 mg/dL (ref 0.40–1.20)
GFR: 120.14 mL/min (ref >60.00)
GLUCOSE: 168 mg/dL — AB (ref 70–99)
Potassium: 3.6 mEq/L (ref 3.5–5.1)
Sodium: 137 mEq/L (ref 135–145)
TOTAL PROTEIN: 7 g/dL (ref 6.0–8.3)
Total Bilirubin: 1.9 mg/dL — ABNORMAL HIGH (ref 0.2–1.2)

## 2018-06-11 LAB — CBC WITH DIFFERENTIAL/PLATELET
BASOS PCT: 0.5 % (ref 0.0–3.0)
Basophils Absolute: 0 10*3/uL (ref 0.0–0.1)
EOS ABS: 0.2 10*3/uL (ref 0.0–0.7)
Eosinophils Relative: 2.7 % (ref 0.0–5.0)
HCT: 45.1 % (ref 36.0–46.0)
HEMOGLOBIN: 16.1 g/dL — AB (ref 12.0–15.0)
Lymphocytes Relative: 38.1 % (ref 12.0–46.0)
Lymphs Abs: 2.1 10*3/uL (ref 0.7–4.0)
MCHC: 35.7 g/dL (ref 30.0–36.0)
MCV: 96.6 fl (ref 78.0–100.0)
Monocytes Absolute: 0.6 10*3/uL (ref 0.1–1.0)
Monocytes Relative: 10.2 % (ref 3.0–12.0)
Neutro Abs: 2.7 10*3/uL (ref 1.4–7.7)
Neutrophils Relative %: 48.5 % (ref 43.0–77.0)
Platelets: 86 10*3/uL — ABNORMAL LOW (ref 150.0–400.0)
RBC: 4.67 Mil/uL (ref 3.87–5.11)
RDW: 13.4 % (ref 11.5–15.5)
WBC: 5.6 10*3/uL (ref 4.0–10.5)

## 2018-06-11 LAB — PROTIME-INR
INR: 1.2 ratio — AB (ref 0.8–1.0)
PROTHROMBIN TIME: 13.8 s — AB (ref 9.6–13.1)

## 2018-06-11 MED ORDER — SPIRONOLACTONE 50 MG PO TABS: 50.0000 mg | ORAL_TABLET | Freq: Every day | ORAL | 3 refills | Status: DC

## 2018-06-11 MED ORDER — FUROSEMIDE 20 MG PO TABS: 20.0000 mg | ORAL_TABLET | Freq: Every day | ORAL | 3 refills | Status: DC

## 2018-06-11 NOTE — Patient Instructions (Signed)
If you are age 53 or older, your body mass index should be between 23-30. Your Body mass index is 46.25 kg/m. If this is out of the aforementioned range listed, please consider follow up with your Primary Care Provider.  If you are age 37 or younger, your body mass index should be between 19-25. Your Body mass index is 46.25 kg/m. If this is out of the aformentioned range listed, please consider follow up with your Primary Care Provider.   Your provider has requested that you go to the basement level for lab work before leaving today. Press "B" on the elevator. The lab is located at the first door on the left as you exit the elevator.  You have been scheduled for an abdominal ultrasound at [facility] (1st floor of hospital) on 06-17-2018 at [place]. Please arrive 15 minutes prior to your appointment for registration. Make certain not to have anything to eat or drink 6 hours prior to your appointment. Should you need to reschedule your appointment, please contact radiology at [phone]. This test typically takes about 30 minutes to perform.  It was a pleasure to see you today!  Dr. Loletha Carrow

## 2018-06-11 NOTE — Progress Notes (Signed)
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  They had apparently prescribed Invokana, but she did not want to take it due to her liver disease.  She then contacted primary care and they showed her to an oral medicine.  She is bothered by at least a 30 pound weight gain within the last year.  She also has some peripheral edema that is worse if she has been standing a long time or sitting in the car.   ROS: Cardiovascular:  no chest pain Respiratory: no dyspnea Chronic back pain since a fall years ago Remainder of systems negative except as above The patient's Past Medical, Family and Social History were reviewed and are on file in the EMR.  Objective:  Med list reviewed  Current Outpatient Medications:  .  glimepiride (AMARYL) 2 MG tablet, Take 1 tablet (2 mg total) by mouth every morning. With breakfast, Disp: 30 tablet, Rfl: 11 .  nadolol (CORGARD) 20 MG tablet, Take 1 tablet (20 mg total) by mouth daily., Disp: 30 tablet, Rfl: 4 .  furosemide (LASIX) 20 MG tablet, Take 1 tablet (20 mg total) by mouth daily., Disp: 30 tablet, Rfl: 3 .  spironolactone (ALDACTONE) 50 MG tablet, Take 1 tablet (50 mg total) by mouth daily., Disp: 303 tablet, Rfl: 3   Vital signs in last 24 hrs: Vitals:   06/11/18 1511  BP: 110/76  Pulse: 75    Physical Exam Antalgic gait, gets on exam table without assistance, but has back pain doing so  HEENT: sclera anicteric, oral mucosa moist without lesions  Neck: supple, no thyromegaly, JVD or lymphadenopathy  Cardiac: RRR without murmurs, S1S2 heard, no peripheral edema  Pulm: clear to auscultation bilaterally, normal RR and effort noted  Abdomen: soft, morbidly obese, no tenderness, with active bowel sounds.  Cannot assess any hepatosplenomegaly due to body  habitus  Skin; warm and dry, no jaundice or rash Neuro: Normal gait, normal finger-to-nose, no asterixis  Recent Labs:  CMP Latest Ref Rng & Units 06/11/2018 11/25/2017 02/21/2017  Glucose 70 - 99 mg/dL 168(H) 292(H) 228(H)  BUN 6 - 23 mg/dL 9 9 7   Creatinine 0.40 - 1.20 mg/dL 0.56 0.52 0.69  Sodium 135 - 145 mEq/L 137 135 141  Potassium 3.5 - 5.1 mEq/L 3.6 3.8 3.7  Chloride 96 - 112 mEq/L 107 106 104  CO2 19 - 32 mEq/L 25 23 22   Calcium 8.4 - 10.5 mg/dL 9.3 8.6 8.9  Total Protein 6.0 - 8.3 g/dL 7.0 7.3 6.9  Total Bilirubin 0.2 - 1.2 mg/dL 1.9(H) 1.7(H) 0.9  Alkaline Phos 39 - 117 U/L 191(H) 158(H) 152(H)  AST 0 - 37 U/L 31 39(H) 42(H)  ALT 0 - 35 U/L 23 28 23    CBC Latest Ref Rng & Units 06/11/2018 02/21/2017 06/23/2016  WBC 4.0 - 10.5 K/uL 5.6 4.6 12.6(H)  Hemoglobin 12.0 - 15.0 g/dL 16.1(H) 14.9 14.0  Hematocrit 36.0 - 46.0 % 45.1 40.4 38.3  Platelets 150.0 - 400.0 K/uL 86.0(L) 96(LL) 127(L)   INR 1.1 on 11/25/17  Hgb A1c 6.9 AFP 9.5 on 11/25/17  Last EGD 09/18/16 - esophageal varices  Radiologic studies:  [place] 11/29/17: Cirrhosis, no ascites, no mass   She reports having had a flu shot this year.   @ASSESSMENTPLANBEGIN @ Assessment: Encounter Diagnoses  Name Primary?  . Liver cirrhosis  secondary to NASH ([place]) Yes  . Secondary esophageal varices without bleeding ([place])   . Elevated LFTs   . Localized edema   . Weight gain     She has cirrhosis from [place] with nonbleeding esophageal varices on primary prophylaxis, chronic LFT elevation, and now portal hypertensive related peripheral edema.  I suspect the obesity is contributing to the edema as well.  She has significant weight gain, and cannot tell on exam whether or not she has ascites.  Plan:  CBC, CMP, INR, AFP Complete abdominal ultrasound to screen for liver mass and assess for ascites I started her on Aldactone 50 mg once daily and furosemide 20 mg once daily  We also discussed the need for 2000 mg daily sodium  restriction.  This was explained in detail through the Spanish interpreter and she expressed understanding.  Follow-up in 3 months to reassess volume status.  Total time 30 minutes, over half spent face-to-face with patient in counseling and coordination of care.  Extra time required for interpreter.  [name]

## 2018-06-12 LAB — AFP TUMOR MARKER: AFP-Tumor Marker: 8 ng/mL — ABNORMAL HIGH

## 2018-06-17 ENCOUNTER — Ambulatory Visit (HOSPITAL_COMMUNITY)
Admission: RE | Admit: 2018-06-17 | Discharge: 2018-06-17 | Disposition: A | Discharge status: Home / Self Care | Payer: No Typology Code available for payment source | Source: Ambulatory Visit | Attending: Gastroenterology | Admitting: Gastroenterology

## 2018-06-17 DIAGNOSIS — R635 Abnormal weight gain: Secondary | ICD-10-CM | POA: Insufficient documentation

## 2018-06-17 DIAGNOSIS — K7460 Unspecified cirrhosis of liver: Secondary | ICD-10-CM | POA: Insufficient documentation

## 2018-06-17 DIAGNOSIS — R600 Localized edema: Secondary | ICD-10-CM | POA: Insufficient documentation

## 2018-06-17 DIAGNOSIS — R7989 Other specified abnormal findings of blood chemistry: Secondary | ICD-10-CM

## 2018-06-17 DIAGNOSIS — I8510 Secondary esophageal varices without bleeding: Secondary | ICD-10-CM

## 2018-06-17 DIAGNOSIS — K7581 Nonalcoholic steatohepatitis (NASH): Secondary | ICD-10-CM

## 2018-06-17 DIAGNOSIS — R161 Splenomegaly, not elsewhere classified: Secondary | ICD-10-CM | POA: Insufficient documentation

## 2018-06-17 DIAGNOSIS — R945 Abnormal results of liver function studies: Secondary | ICD-10-CM | POA: Insufficient documentation

## 2018-06-18 ENCOUNTER — Telehealth: Payer: Self-pay | Admitting: Gastroenterology

## 2018-06-18 NOTE — Telephone Encounter (Signed)
Routed to Dr. Loletha Carrow.

## 2018-06-18 NOTE — Telephone Encounter (Signed)
Pt calling regarding lab results.

## 2018-06-20 ENCOUNTER — Other Ambulatory Visit: Payer: Self-pay

## 2018-06-20 DIAGNOSIS — K7460 Unspecified cirrhosis of liver: Secondary | ICD-10-CM

## 2018-06-20 DIAGNOSIS — K7581 Nonalcoholic steatohepatitis (NASH): Principal | ICD-10-CM

## 2018-06-24 ENCOUNTER — Other Ambulatory Visit: Payer: Self-pay

## 2018-06-25 ENCOUNTER — Other Ambulatory Visit: Payer: Self-pay

## 2018-06-25 ENCOUNTER — Encounter (HOSPITAL_COMMUNITY): Payer: Self-pay | Admitting: *Deleted

## 2018-06-25 DIAGNOSIS — R7989 Other specified abnormal findings of blood chemistry: Secondary | ICD-10-CM

## 2018-06-25 DIAGNOSIS — R945 Abnormal results of liver function studies: Principal | ICD-10-CM

## 2018-07-04 ENCOUNTER — Other Ambulatory Visit (INDEPENDENT_AMBULATORY_CARE_PROVIDER_SITE_OTHER): Payer: Self-pay

## 2018-07-04 DIAGNOSIS — R945 Abnormal results of liver function studies: Secondary | ICD-10-CM

## 2018-07-04 DIAGNOSIS — R7989 Other specified abnormal findings of blood chemistry: Secondary | ICD-10-CM

## 2018-07-04 LAB — BASIC METABOLIC PANEL
BUN: 11 mg/dL (ref 6–23)
CO2: 24 meq/L (ref 19–32)
Calcium: 9 mg/dL (ref 8.4–10.5)
Chloride: 104 mEq/L (ref 96–112)
Creatinine, Ser: 0.89 mg/dL (ref 0.40–1.20)
GFR: 70.37 mL/min (ref >60.00)
GLUCOSE: 375 mg/dL — AB (ref 70–99)
Potassium: 3.9 mEq/L (ref 3.5–5.1)
SODIUM: 134 meq/L — AB (ref 135–145)

## 2018-07-29 ENCOUNTER — Telehealth: Payer: Self-pay | Admitting: Gastroenterology

## 2018-07-29 NOTE — Telephone Encounter (Signed)
Deborah Pham, please contact the patient and ask which test results.  She was notified of LFT on 07/09/18.  Please clarify in Spanish what she is asking for?

## 2018-08-07 ENCOUNTER — Encounter: Payer: No Typology Code available for payment source | Admitting: Internal Medicine

## 2018-08-07 NOTE — Progress Notes (Deleted)
   CC: follow-up on type 2 diabetes  HPI:  [name] is a 52 y.o. female with history noted below that presents to the internal medicine clinic for follow-up on type 2 diabetes. Please see problem based charting for the status of patient's chronic medical conditions.  Past Medical History:  Diagnosis Date  . Diabetes mellitus without complication ([place])   . NASH (nonalcoholic steatohepatitis)   . Obesity     Review of Systems:  ***  Physical Exam:  There were no vitals filed for this visit. ***  Assessment & Plan:   See encounters tab for problem based medical decision making.  History of present illness: Patient is taking glimepiride 2 mg daily without issues Assessment: Type 2 diabetes  hemoglobin A1c is, Stable and controlled  Plan Continue Coumadin for a 2 mg daily  Assessment: Osteoarthritis of left knee Patient has taken diclofenac pills and done Voltaren gel for her knee pain with some benefit but would like a steroid injection today. No effusion or erythema noted to the knee.   Plan -steroid injection  Patient {GC/GE:3044014::"discussed with","seen with"} Dr. {NAMES:[id number]::"Pham","Pham","E. Arisbeth Pham","Pham","Pham","Pham","Pham"}

## 2018-09-17 ENCOUNTER — Other Ambulatory Visit: Payer: Self-pay | Admitting: Internal Medicine

## 2018-09-17 DIAGNOSIS — K7469 Other cirrhosis of liver: Secondary | ICD-10-CM

## 2018-09-18 ENCOUNTER — Telehealth: Payer: Self-pay | Admitting: Gastroenterology

## 2018-09-18 ENCOUNTER — Other Ambulatory Visit: Payer: Self-pay

## 2018-09-18 DIAGNOSIS — K7469 Other cirrhosis of liver: Secondary | ICD-10-CM

## 2018-09-18 MED ORDER — NADOLOL 20 MG PO TABS: 20.0000 mg | ORAL_TABLET | Freq: Every day | ORAL | 2 refills | Status: DC

## 2018-09-18 NOTE — Telephone Encounter (Signed)
Pt needs prescription for nadolol sent to [facility]. She is out of pills.

## 2018-09-18 NOTE — Telephone Encounter (Signed)
Refilled the nadolol 20 mg 1 a day, at #30 with 3 refills

## 2018-09-22 ENCOUNTER — Other Ambulatory Visit (HOSPITAL_COMMUNITY): Payer: Self-pay | Admitting: *Deleted

## 2018-09-22 DIAGNOSIS — N644 Mastodynia: Secondary | ICD-10-CM

## 2018-09-26 ENCOUNTER — Other Ambulatory Visit: Payer: No Typology Code available for payment source

## 2018-11-05 ENCOUNTER — Encounter (INDEPENDENT_AMBULATORY_CARE_PROVIDER_SITE_OTHER): Payer: Self-pay

## 2018-11-05 ENCOUNTER — Other Ambulatory Visit: Payer: Self-pay

## 2018-11-05 ENCOUNTER — Ambulatory Visit (INDEPENDENT_AMBULATORY_CARE_PROVIDER_SITE_OTHER): Payer: Self-pay | Admitting: Internal Medicine

## 2018-11-05 ENCOUNTER — Encounter: Payer: Self-pay | Admitting: Internal Medicine

## 2018-11-05 VITALS — BP 111/57 | HR 63 | Temp 97.8°F | Ht 59.0 in | Wt 229.0 lb

## 2018-11-05 DIAGNOSIS — Z7984 Long term (current) use of oral hypoglycemic drugs: Secondary | ICD-10-CM

## 2018-11-05 DIAGNOSIS — Z91120 Patient's intentional underdosing of medication regimen due to financial hardship: Secondary | ICD-10-CM

## 2018-11-05 DIAGNOSIS — E118 Type 2 diabetes mellitus with unspecified complications: Secondary | ICD-10-CM

## 2018-11-05 LAB — POCT GLYCOSYLATED HEMOGLOBIN (HGB A1C): Hemoglobin A1C: 8.4 % — AB (ref 4.0–5.6)

## 2018-11-05 LAB — GLUCOSE, CAPILLARY: Glucose-Capillary: 173 mg/dL — ABNORMAL HIGH (ref 70–99)

## 2018-11-05 NOTE — Patient Instructions (Addendum)
Ms. Depasquale,  Alguien te va a llamar para hacer una cita para hablar de opciones para tu medicina de diabetes.

## 2018-11-05 NOTE — Progress Notes (Signed)
   CC: follow-up on type 2 diabetes  HPI:  Deborah Pham is a 53 y.o. female with history noted below that presents to the acute care clinic for follow-up on type 2 diabetes. Please see problem based charting for the status of patient's chronic medical conditions.  Past Medical History:  Diagnosis Date  . Diabetes mellitus without complication ([place])   . NASH (nonalcoholic steatohepatitis)   . Obesity     Review of Systems:  Review of Systems  Respiratory: Negative for shortness of breath.   Cardiovascular: Negative for chest pain.     Physical Exam:  Vitals:   11/05/18 1607  BP: (!) 111/57  Pulse: 63  Temp: 97.8 F (36.6 C)  TempSrc: Oral  SpO2: 98%  Weight: 229 lb (103.9 kg)  Height: 4' 11"  (1.499 m)   Physical Exam  Constitutional: She is well-developed, well-nourished, and in no distress.  Cardiovascular: Normal rate, regular rhythm and normal heart sounds. Exam reveals no gallop and no friction rub.  No murmur heard. Pulmonary/Chest: Effort normal and breath sounds normal. No respiratory distress. She has no wheezes. She has no rales.     Assessment & Plan:   See encounters tab for problem based medical decision making.   Patient discussed with Dr. [name]

## 2018-11-06 NOTE — Assessment & Plan Note (Signed)
History of present illness: patient reports taking glimepiride 2 mg tablets daily  Assessment: type 2 diabetes Hemoglobin A1c was 8.4 today.  Patient has tried [place] but states she was intolerant to the medication.  Due to no insurance patient can not afford other oral medications that have been prescribed.  Will set up appointment with Dr. [name] to see best financial option for diabetes controlling medications.  Plan -follow up with Dr. [name]

## 2018-11-06 NOTE — Progress Notes (Signed)
Internal Medicine Clinic Attending  Case discussed with Dr. Hoffman at the time of the visit.  We reviewed the resident's history and exam and pertinent patient test results.  I agree with the assessment, diagnosis, and plan of care documented in the resident's note.  

## 2018-11-11 ENCOUNTER — Ambulatory Visit (HOSPITAL_COMMUNITY)
Admission: RE | Admit: 2018-11-11 | Discharge: 2018-11-11 | Disposition: A | Discharge status: Home / Self Care | Payer: Self-pay | Source: Ambulatory Visit | Attending: Obstetrics and Gynecology | Admitting: Obstetrics and Gynecology

## 2018-11-11 ENCOUNTER — Encounter (HOSPITAL_COMMUNITY): Payer: Self-pay

## 2018-11-11 ENCOUNTER — Other Ambulatory Visit: Payer: Self-pay

## 2018-11-11 ENCOUNTER — Ambulatory Visit
Admission: RE | Admit: 2018-11-11 | Discharge: 2018-11-11 | Disposition: A | Discharge status: Home / Self Care | Payer: No Typology Code available for payment source | Source: Ambulatory Visit | Attending: Obstetrics and Gynecology | Admitting: Obstetrics and Gynecology

## 2018-11-11 ENCOUNTER — Ambulatory Visit: Payer: No Typology Code available for payment source

## 2018-11-11 VITALS — BP 110/64 | Wt 226.0 lb

## 2018-11-11 DIAGNOSIS — N644 Mastodynia: Secondary | ICD-10-CM

## 2018-11-11 DIAGNOSIS — Z1239 Encounter for other screening for malignant neoplasm of breast: Secondary | ICD-10-CM

## 2018-11-11 NOTE — Patient Instructions (Signed)
Explained breast self awareness with Chilton Si. Patient did not need a Pap smear today due to last Pap smear and HPV typing was 01/10/2017. Let her know BCCCP will cover Pap smears and HPV typing every 5 years unless has a history of abnormal Pap smears. Referred patient to the [facility] for a left breast diagnostic mammogram. Appointment scheduled for Tuesday, November 11, 2018 at 1050. Patient aware of appointment and will be there. [place] verbalized understanding.  Thena Devora, Arvil Chaco, RN 8:47 AM

## 2018-11-11 NOTE — Progress Notes (Signed)
Complaints of left breast pain since December 2019 that has recently decreased. Patient states the pain radiates from the outer breast to the nipple area. Patient states the pain comes and goes. Patient rates the pain at a 2-3 out of 10.  Pap Smear: Pap smear not completed today. Last Pap smear was 01/10/2017 at [facility] and normal with negative HPV. Per patient has no history of an abnormal Pap smear. Last Pap smear result is in EPIC.   Physical exam: Breasts Breasts symmetrical. No skin abnormalities bilateral breasts. No nipple retraction bilateral breasts. No nipple discharge bilateral breasts. No lymphadenopathy. No lumps palpated bilateral breasts. No complaints of pain or tenderness on exam. Referred patient to the [facility] for a left breast diagnostic mammogram. Appointment scheduled for Tuesday, November 11, 2018 at 1050.        Pelvic/Bimanual No Pap smear completed today since last Pap smear and HPV typing was 01/10/2017. Pap smear not indicated per BCCCP guidelines.   Smoking History: Patient has never smoked.  Patient Navigation: Patient education provided. Access to services provided for patient through [facility] program. Spanish interpreter provided.   Colorectal Cancer Screening: Per patient has never had a colonoscopy completed. FIT Test completed 01/14/2017 and negative. No complaints today. FIT Test given to patient to complete and return to BCCCP.  Breast and Cervical Cancer Risk Assessment: Patient has no family history of breast cancer, known genetic mutations, or radiation treatment to the chest before age 44. Patient has no history of cervical dysplasia, immunocompromised, or DES exposure in-utero.  Risk Assessment    Risk Scores      11/11/2018 05/29/2018   Last edited by: Armond Hang, LPN Rolena Infante H, LPN   5-year risk: 0.7 % 0.7 %   Lifetime risk: 5.9 % 5.9 %         Used Spanish interpreter Rudene Anda from [place].

## 2018-11-11 NOTE — Addendum Note (Signed)
Encounter addended by: Loletta Parish, RN on: 11/11/2018 8:51 AM  Actions taken: Clinical Note Signed

## 2018-11-12 ENCOUNTER — Ambulatory Visit: Payer: No Typology Code available for payment source | Admitting: Pharmacist

## 2018-12-04 ENCOUNTER — Telehealth: Payer: Self-pay | Admitting: Gastroenterology

## 2018-12-04 DIAGNOSIS — K7469 Other cirrhosis of liver: Secondary | ICD-10-CM

## 2018-12-04 NOTE — Telephone Encounter (Signed)
Pt is requesting rf for Nadolol 20 mg sent to [facility].

## 2018-12-08 MED ORDER — NADOLOL 20 MG PO TABS: 20.0000 mg | ORAL_TABLET | Freq: Every day | ORAL | 2 refills | Status: DC

## 2018-12-08 NOTE — Telephone Encounter (Signed)
Refilled 90 days supply only.

## 2019-01-13 ENCOUNTER — Telehealth: Payer: Self-pay | Admitting: *Deleted

## 2019-01-13 NOTE — Telephone Encounter (Signed)
Dr [name], can you call this lady her husband states she is having trouble with her medicine

## 2019-01-14 NOTE — Telephone Encounter (Signed)
Spoke to patient about medications and she reported she may have been confused about where to pick up medications. Provided patient with information to [facility] and advised patient to contact clinic if further questions. Patient verbalized understanding

## 2019-01-14 NOTE — Telephone Encounter (Signed)
Tried calling pt, left voicemail

## 2019-02-16 IMAGING — US US ABDOMEN LIMITED
1 series · 14 of 25 positions shown · non-contrast
Comparison: CT abdomen and pelvis 06/21/2016. Right upper quadrant
ultrasound 06/07/2017.

CLINICAL DATA: History of cirrhosis. Elevated liver function tests.

EXAM:
ULTRASOUND ABDOMEN LIMITED RIGHT UPPER QUADRANT

[Series 1: us abdomen limited · 14 of 52 slices shown]
[im 1/52]
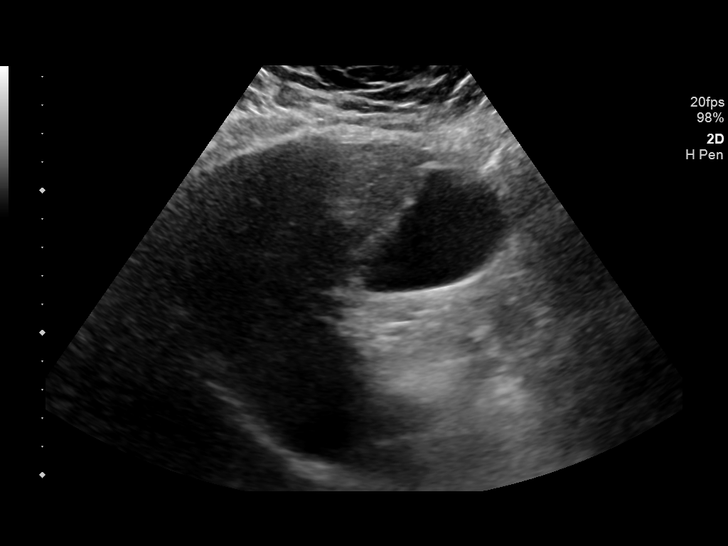
[im 5/52]
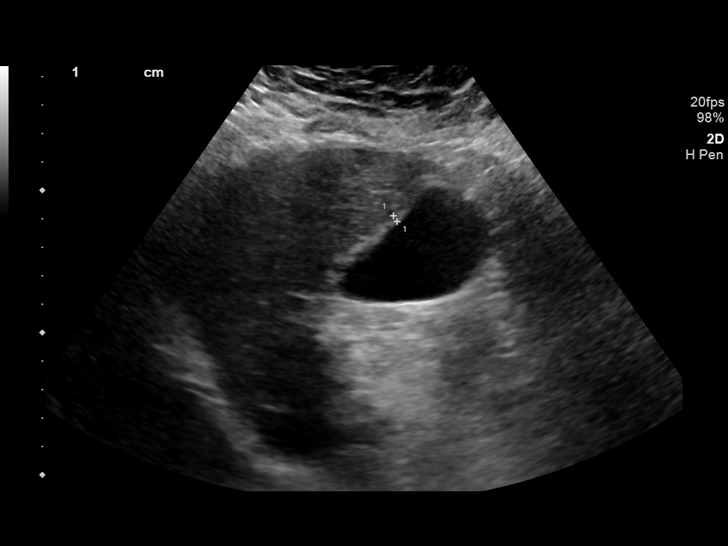
[im 9/52]
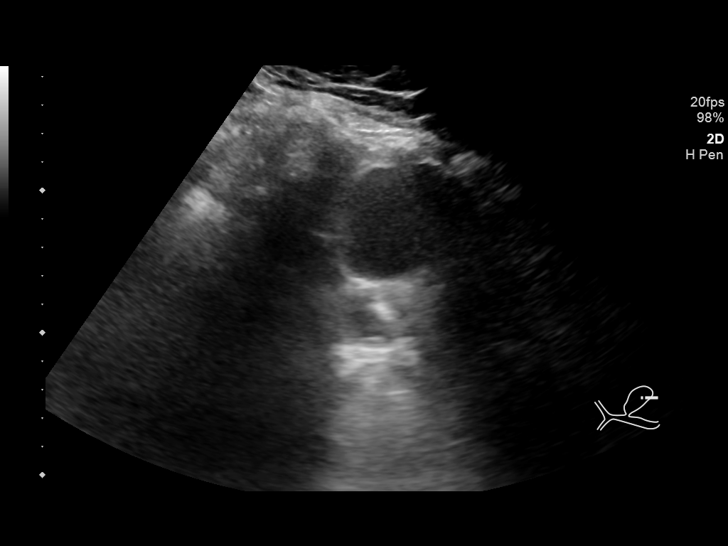
[im 13/52]
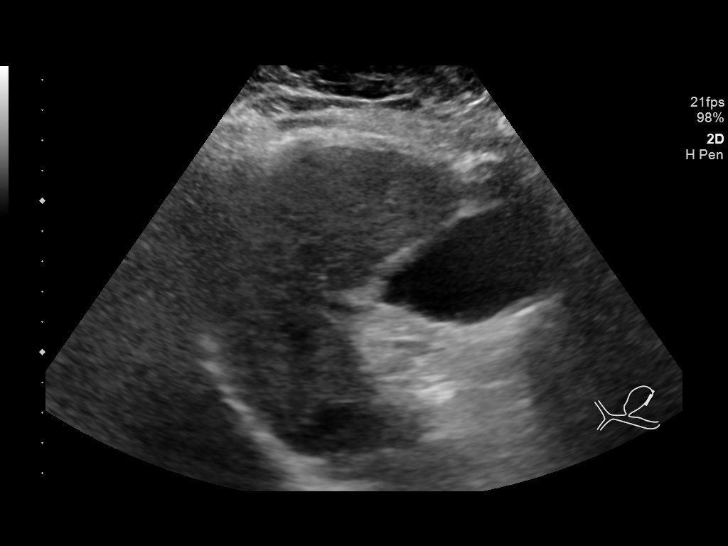
[im 18/52]
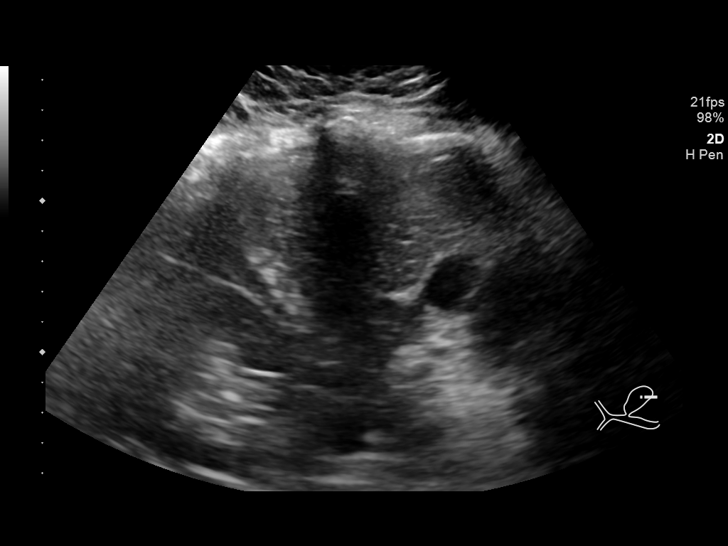
[im 20/52]
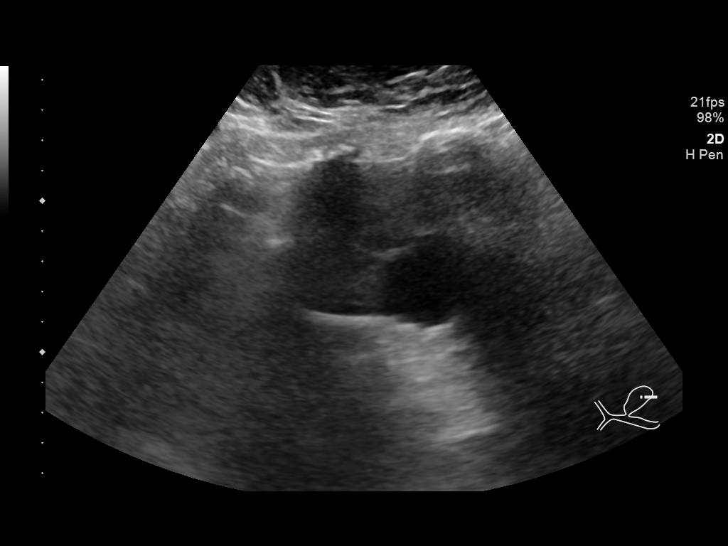
[im 24/52]
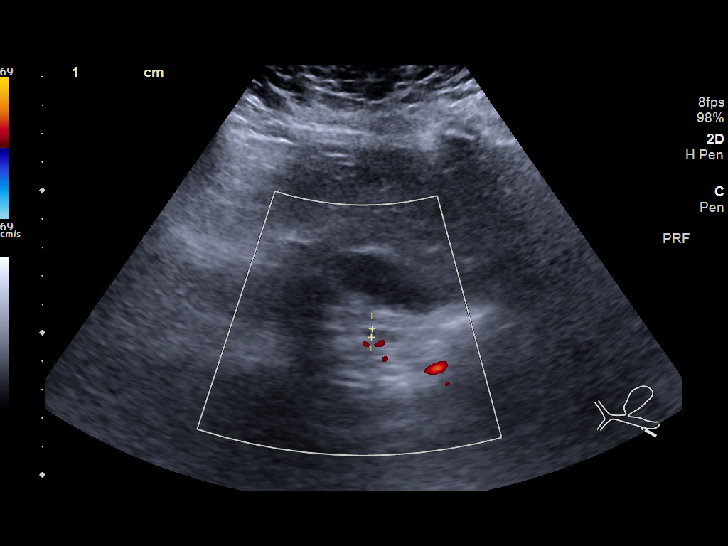
[im 28/52]
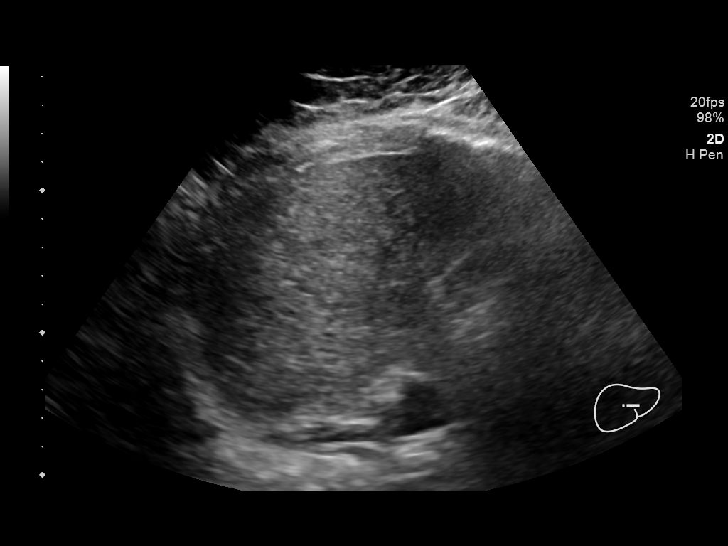
[im 32/52]
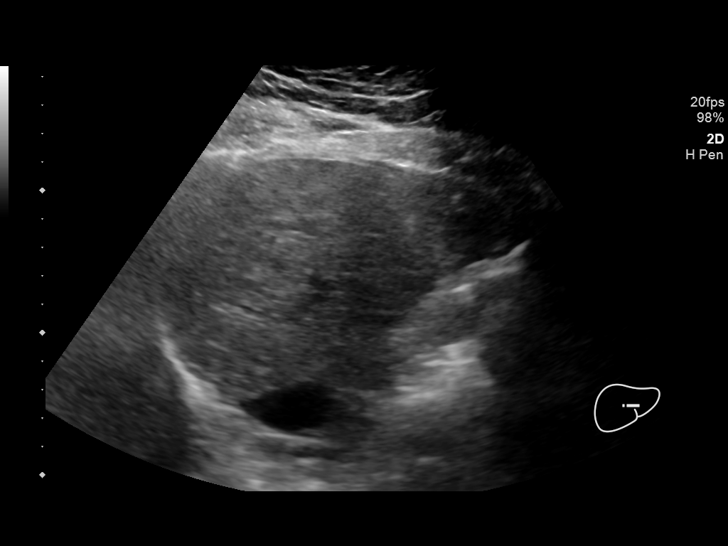
[im 35/52]
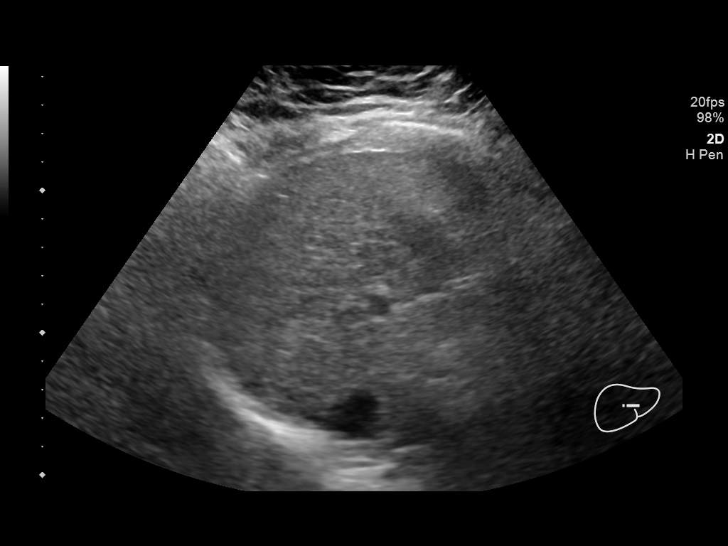
[im 39/52]
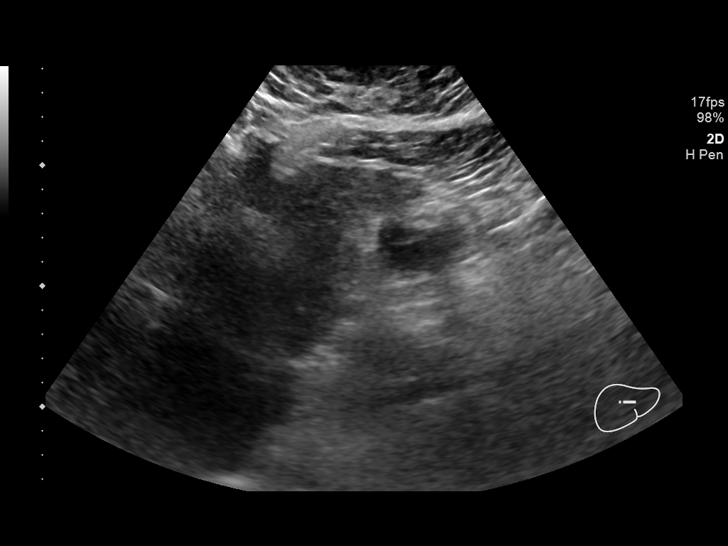
[im 43/52]
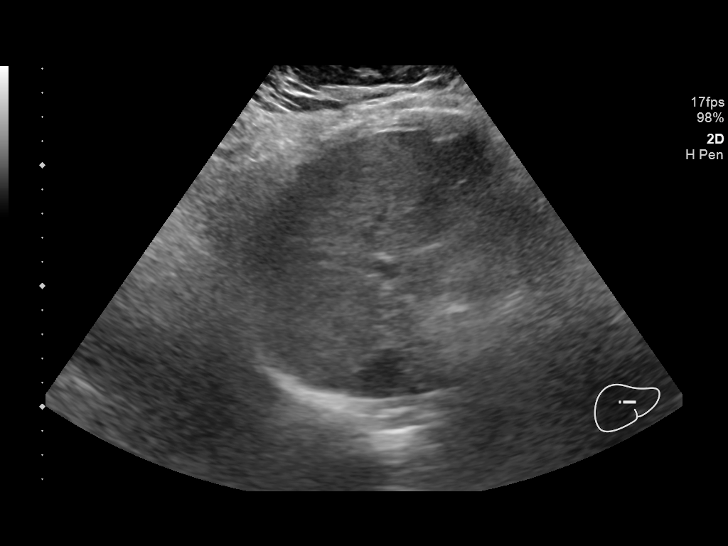
[im 47/52]
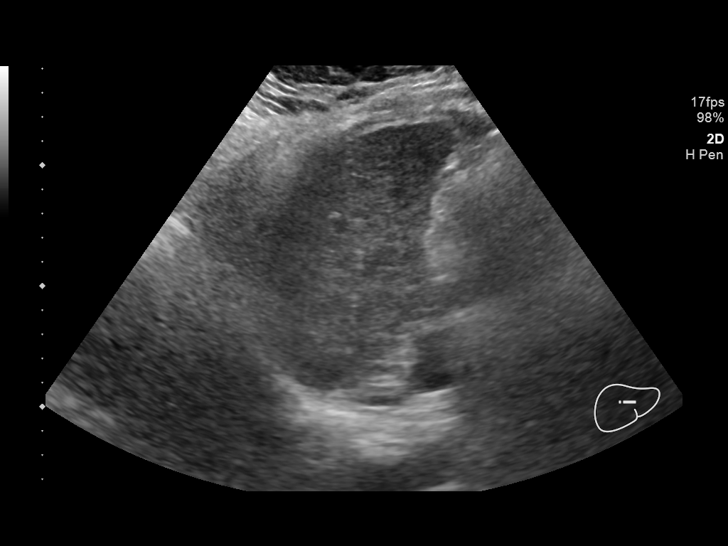
[im 52/52]
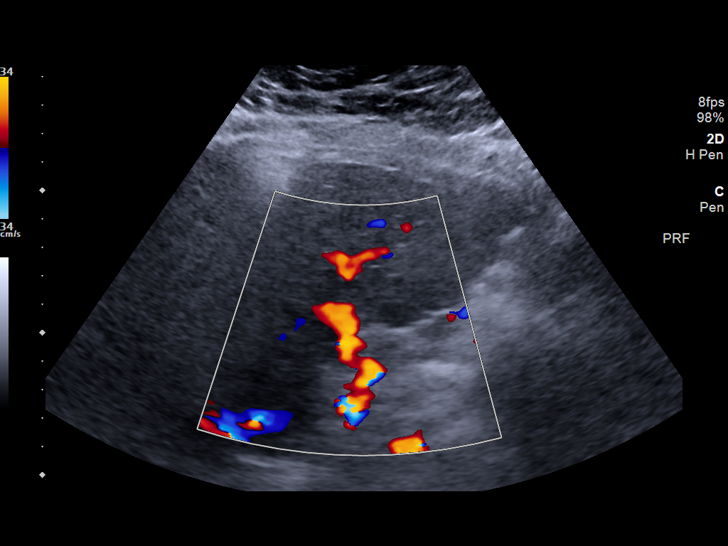

[14 of 25 positions shown; findings below may reference images not displayed]

FINDINGS: Gallbladder:

No gallstones or wall thickening visualized. No sonographic Murphy
sign noted by sonographer.

Common bile duct:

Diameter: 0.3 cm.

Liver:

No focal lesion. The liver is shrunken with a nodular border
consistent with cirrhosis as seen on the prior exams. No ascites is
seen. Portal vein is patent on color Doppler imaging with normal
direction of blood flow towards the liver.
IMPRESSION: Cirrhotic liver.  No focal abnormality.

Normal gallbladder.

## 2019-02-19 ENCOUNTER — Other Ambulatory Visit: Payer: Self-pay | Admitting: *Deleted

## 2019-02-19 NOTE — Telephone Encounter (Addendum)
Confirmed with Butch Penny that patient's husband uses Contour Next EZ glucometer. Refill request for test strips sent to PCP. [name], RN, BSN

## 2019-02-19 NOTE — Telephone Encounter (Signed)
Patient's husband here for appt requesting refills for patient: test strips, diabetes med and med for "liver." Call placed to [facility] for refill needs. [name] states Deborah Pham filled Amaryl and Corgard and these were picked up on 02/09/2019. States they have not filled lasix or spironolactone since Dec 2019. They have never filled test strips for patient. Husband states patient uses his Contour glucometer. Patient does have hx of end stage liver disease.   Call placed to patient via [name] [phone] for clarification on refill requests. No answer. VM left requesting return call. [name], RN, BSN

## 2019-02-20 ENCOUNTER — Other Ambulatory Visit: Payer: Self-pay | Admitting: Internal Medicine

## 2019-02-20 MED ORDER — CONTOUR NEXT TEST VI STRP: ORAL_STRIP | 12 refills | Status: DC

## 2019-02-23 ENCOUNTER — Encounter: Payer: Self-pay | Admitting: *Deleted

## 2019-03-11 ENCOUNTER — Telehealth: Payer: Self-pay | Admitting: Gastroenterology

## 2019-03-11 DIAGNOSIS — K7469 Other cirrhosis of liver: Secondary | ICD-10-CM

## 2019-03-11 MED ORDER — NADOLOL 20 MG PO TABS: 20.0000 mg | ORAL_TABLET | Freq: Every day | ORAL | 1 refills | Status: DC

## 2019-03-11 NOTE — Telephone Encounter (Signed)
Last seen 08-2018. She now states she has no insurance for a visit. Please advise on refills

## 2019-03-11 NOTE — Telephone Encounter (Signed)
I will continue prescribing her nadolol for now.  Prescription was sent  however, she does need to follow regularly with primary care so blood pressure can be checked on this medicine.

## 2019-03-12 NOTE — Telephone Encounter (Signed)
I had Deborah Pham call her as she speaks spanish. She states understanding on the importance of monitoring her BP. She will be seeing her primary doctor on a regular schedule as she is also diabetic.

## 2019-03-17 ENCOUNTER — Encounter: Payer: Self-pay | Admitting: Internal Medicine

## 2019-03-17 ENCOUNTER — Ambulatory Visit (INDEPENDENT_AMBULATORY_CARE_PROVIDER_SITE_OTHER): Payer: Self-pay | Admitting: Internal Medicine

## 2019-03-17 ENCOUNTER — Other Ambulatory Visit: Payer: Self-pay

## 2019-03-17 DIAGNOSIS — K7469 Other cirrhosis of liver: Secondary | ICD-10-CM

## 2019-03-17 DIAGNOSIS — M1990 Unspecified osteoarthritis, unspecified site: Secondary | ICD-10-CM

## 2019-03-17 DIAGNOSIS — E118 Type 2 diabetes mellitus with unspecified complications: Secondary | ICD-10-CM

## 2019-03-17 DIAGNOSIS — Z79899 Other long term (current) drug therapy: Secondary | ICD-10-CM

## 2019-03-17 DIAGNOSIS — Z7984 Long term (current) use of oral hypoglycemic drugs: Secondary | ICD-10-CM

## 2019-03-17 DIAGNOSIS — K760 Fatty (change of) liver, not elsewhere classified: Secondary | ICD-10-CM

## 2019-03-17 DIAGNOSIS — Z0000 Encounter for general adult medical examination without abnormal findings: Secondary | ICD-10-CM

## 2019-03-17 DIAGNOSIS — I8510 Secondary esophageal varices without bleeding: Secondary | ICD-10-CM

## 2019-03-17 LAB — POCT GLYCOSYLATED HEMOGLOBIN (HGB A1C): Hemoglobin A1C: 8.3 % — AB (ref 4.0–5.6)

## 2019-03-17 LAB — GLUCOSE, CAPILLARY: Glucose-Capillary: 315 mg/dL — ABNORMAL HIGH (ref 70–99)

## 2019-03-17 NOTE — Assessment & Plan Note (Signed)
We discussed her current medication she is taking for this.  She notes that she is only taking the nadolol at this time. Follows with GI regarding this issue.

## 2019-03-17 NOTE — Addendum Note (Signed)
Addended by: Truddie Crumble on: 03/17/2019 10:30 AM   Modules accepted: Orders

## 2019-03-17 NOTE — Assessment & Plan Note (Addendum)
Medications: Amaryl 2 mg daily Last A1c 3 months ago was 8.4. We discussed that we can likely optimize her A1c with other medication options.  I did send a referral to our clinical pharmacist in order to set up an appointment to discuss the options with her.  Last diabetic eye exam was in April 2020 per patient's report. Plan: Pharmacy referral placed.  We will continue Amaryl 2 mg for now.  Will check A1c today.  We will also check a urine micro albumin today as well.

## 2019-03-17 NOTE — Progress Notes (Signed)
Internal Medicine Clinic Attending  I saw and evaluated the patient.  I personally confirmed the key portions of the history and exam documented by Dr. Darrick Meigs and I reviewed pertinent patient test results.  The assessment, diagnosis, and plan were formulated together and I agree with the documentation in the resident's note.  Would benefit from improved glycemic control, but cost is an issue as is her liver disease. Will consult with Dr. [name].  Lenice Pressman, M.D., Ph.D.

## 2019-03-17 NOTE — Assessment & Plan Note (Signed)
We also discussed colon cancer screening at today's visit.  She notes that she does have a fit test at home and just needs to send it in.  Other colon cancer screening options were reviewed with her and she does not wish to undergo colonoscopy.  I encouraged her to get a fit test sent in as soon as possible.

## 2019-03-17 NOTE — Progress Notes (Signed)
   CC: type 2 DM  HPI:  [name] is a 53 y.o. 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 Accompanied by her husband at today's visit.  Interpreter was present throughout the appointment. She presents today for 45-monthfollow-up.  She denies any medication side effects at this time. We also discussed colon cancer screening.  She notes that she does have a fit test at home and just needs to send it in.  Past Medical History:  Diagnosis Date  . Diabetes mellitus without complication ([place]   . NASH (nonalcoholic steatohepatitis)   . Obesity    Review of Systems:  negative other than those stated in HPI  Physical Exam:  Vitals:   03/17/19 0900  BP: (!) 119/58  Pulse: 69  Temp: 98 F (36.7 C)  TempSrc: Oral  SpO2: 99%  Weight: 230 lb 1.6 oz (104.4 kg)  Height: 4' 11"  (1.499 m)    GENERAL: well appearing, in no apparent distress CARDIAC: heart regular rate and rhythm, no peripheral edema appreciated PULMONARY: lung sounds clear to auscultation SKIN: no rash or lesion on limited exam NEURO: CN II-XII grossly intact. Sensation to dorsal and sole of foot intact to pinprick.   Assessment & Plan:   See Encounters Tab for problem based charting.  Pertinent labs & imaging results that were available during my care of the patient were reviewed by me and considered in my medical decision making  Patient is in agreement with the plan and endorses no further questions at this time.  Patient seen with Dr. RClista Bernhardt MD Internal Medicine Resident-PGY1 03/17/19

## 2019-03-17 NOTE — Patient Instructions (Signed)
We will check your A1C today. I will also place a referral to our pharmacist for better management of your diabetes. For now, continue your current medications.

## 2019-03-18 ENCOUNTER — Telehealth: Payer: Self-pay | Admitting: *Deleted

## 2019-03-18 LAB — MICROALBUMIN / CREATININE URINE RATIO
Creatinine, Urine: 43.3 mg/dL
Microalb/Creat Ratio: 30 mg/g creat — ABNORMAL HIGH (ref 0–29)
Microalbumin, Urine: 13 ug/mL

## 2019-03-18 NOTE — Progress Notes (Signed)
Please let Ms. Aden know that her A1C is 8.3. We need to work to find medications to help her lower this. Please encourage her to set up an appointment with the pharmacist if she has not yet. Interpreter will be needed for the call. Thank you--Kanae Ignatowski

## 2019-03-18 NOTE — Telephone Encounter (Signed)
Thank you :)

## 2019-03-18 NOTE — Telephone Encounter (Addendum)
Call placed to patient via [address] [place], [place]. Notified of below. Appt made with Dr. [name] for 03/23/2019 at 0930. Instructed patient to bring all her meds with her. [name], RN, BSN

## 2019-03-18 NOTE — Telephone Encounter (Signed)
-----   Message from Deborah Hansen, MD sent at 03/18/2019  1:40 PM EDT ----- Please let [name] know that her A1C is 8.3. We need to work to find medications to help her lower this. Please encourage her to set up an appointment with the pharmacist if she has not yet. Interpreter will be needed for the call. Thank you--Ry Truman Hayward

## 2019-03-23 ENCOUNTER — Ambulatory Visit: Payer: Self-pay | Admitting: Pharmacist

## 2019-03-23 DIAGNOSIS — E118 Type 2 diabetes mellitus with unspecified complications: Secondary | ICD-10-CM

## 2019-03-23 MED ORDER — TRULICITY 0.75 MG/0.5ML ~~LOC~~ SOAJ: 0.7500 mg | SUBCUTANEOUS | 0 refills | Status: DC

## 2019-03-24 NOTE — Progress Notes (Signed)
S: Deborah Pham is a 53 y.o. female reports to clinical pharmacist appointment for assistance with DM medication management per clinic referral.  No Known Allergies  Current Outpatient Medications:  .  Dulaglutide (TRULICITY) 9.62 IW/9.7LG SOPN, Inject 0.75 mg into the skin once a week., Disp: 4 pen, Rfl: 0 .  glucose blood (CONTOUR NEXT TEST) test strip, Use as instructed, Disp: 100 each, Rfl: 12 .  nadolol (CORGARD) 20 MG tablet, Take 1 tablet (20 mg total) by mouth daily., Disp: 90 tablet, Rfl: 1 Past Medical History:  Diagnosis Date  . Diabetes mellitus without complication ([place])   . NASH (nonalcoholic steatohepatitis)   . Obesity    Social History   Socioeconomic History  . Marital status: Married    Spouse name: Not on file  . Number of children: 2  . Years of education: Not on file  . Highest education level: 12th grade  Occupational History  . Occupation: housewife  Social Needs  . Financial resource strain: Not on file  . Food insecurity    Worry: Not on file    Inability: Not on file  . Transportation needs    Medical: No    Non-medical: No  Tobacco Use  . Smoking status: Never Smoker  . Smokeless tobacco: Never Used  Substance and Sexual Activity  . Alcohol use: No  . Drug use: No  . Sexual activity: Yes    Birth control/protection: None  Lifestyle  . Physical activity    Days per week: Not on file    Minutes per session: Not on file  . Stress: Not on file  Relationships  . Social Herbalist on phone: Not on file    Gets together: Not on file    Attends religious service: Not on file    Active member of club or organization: Not on file    Attends meetings of clubs or organizations: Not on file    Relationship status: Not on file  Other Topics Concern  . Not on file  Social History Narrative  . Not on file   Family History  Problem Relation Age of Onset  . Liver disease Father   . Alcoholism Father   . Diabetes Mother   .  Colon cancer Neg Hx   . Breast cancer Neg Hx     O:    Component Value Date/Time   CHOL 162 06/18/2008 1528   HDL 70 06/18/2008 1528   LDLCALC 67 06/18/2008 1528   TRIG 124 06/18/2008 1528   GLUCOSE 375 (H) 07/04/2018 1459   HGBA1C 8.3 (A) 03/17/2019 1030   HGBA1C 8.9 (H) 06/22/2016 0918   NA 134 (L) 07/04/2018 1459   NA 141 02/21/2017 1603   K 3.9 07/04/2018 1459   CL 104 07/04/2018 1459   CO2 24 07/04/2018 1459   BUN 11 07/04/2018 1459   BUN 7 02/21/2017 1603   CREATININE 0.89 07/04/2018 1459   CALCIUM 9.0 07/04/2018 1459   GFRNONAA 100 02/21/2017 1603   GFRAA 116 02/21/2017 1603   AST 31 06/11/2018 1626   ALT 23 06/11/2018 1626   WBC 5.6 06/11/2018 1626   HGB 16.1 (H) 06/11/2018 1626   HGB 14.9 02/21/2017 1603   HCT 45.1 06/11/2018 1626   HCT 40.4 02/21/2017 1603   PLT 86.0 (L) 06/11/2018 1626   PLT 96 (LL) 02/21/2017 1603   TSH 1.400 11/28/2017 1534   Ht Readings from Last 2 Encounters:  03/17/19 4' 11"  (1.499 m)  11/05/18 4' 11"  (1.499 m)   Wt Readings from Last 2 Encounters:  03/17/19 230 lb 1.6 oz (104.4 kg)  11/11/18 226 lb (102.5 kg)   There is no height or weight on file to calculate BMI. BP Readings from Last 3 Encounters:  03/17/19 (!) 119/58  11/11/18 110/64  11/05/18 (!) 111/57   A/P: Patient reports currently taking glimepiride 2 mg daily. A1C not at goal this past year, and patient reports trouble affording both glimepiride and nadolol. Of note, patient has limited pharmacotherapy options for DM due to hepatic cirrhosis. We discussed DM medication options and patient elected Trulicity (dulaglutide) 0.75 mg once weekly. Patient education was provided about Trulicity and will work with patient for free access through manufacturer assistance program (upcoming [facility] appointment with Financial Counselor on 8/5---will follow up with patient around this date).  Will contact gastroenterology for recommendations to support beta blocker affordability (no free  program for nadolol, but may consider carvedilol which has a free program).  An after visit summary was provided and patient advised to follow up if any changes in condition or questions regarding medications arise.   The patient verbalized understanding of information provided by repeating back concepts discussed.

## 2019-03-25 ENCOUNTER — Telehealth: Payer: Self-pay | Admitting: Gastroenterology

## 2019-03-25 NOTE — Telephone Encounter (Signed)
Dr. Loletha Carrow, this patient wants to know if Trulance is going to cause liver damage? Please advise.

## 2019-03-26 NOTE — Telephone Encounter (Signed)
This medicine is not known to cause liver damage, and it does not need dose adjustment in patients with cirrhosis.

## 2019-03-26 NOTE — Telephone Encounter (Signed)
The information was relayed to the patient via a Spanish Interpreter with the telephonic interpreting services.

## 2019-04-01 ENCOUNTER — Other Ambulatory Visit: Payer: Self-pay

## 2019-04-01 ENCOUNTER — Ambulatory Visit: Payer: No Typology Code available for payment source

## 2019-04-07 ENCOUNTER — Other Ambulatory Visit: Payer: Self-pay

## 2019-04-13 ENCOUNTER — Other Ambulatory Visit: Payer: Self-pay | Admitting: Pharmacist

## 2019-04-13 DIAGNOSIS — E118 Type 2 diabetes mellitus with unspecified complications: Secondary | ICD-10-CM

## 2019-04-13 MED ORDER — TRULICITY 1.5 MG/0.5ML ~~LOC~~ SOAJ: 1.5000 mg | SUBCUTANEOUS | 2 refills | Status: DC

## 2019-04-13 NOTE — Progress Notes (Signed)
Applied for [employer] for free access to [employer].

## 2019-04-16 LAB — FECAL OCCULT BLOOD, IMMUNOCHEMICAL

## 2019-04-20 ENCOUNTER — Ambulatory Visit: Payer: Self-pay | Admitting: Pharmacist

## 2019-04-20 ENCOUNTER — Telehealth: Payer: Self-pay

## 2019-04-20 ENCOUNTER — Other Ambulatory Visit: Payer: Self-pay

## 2019-04-20 DIAGNOSIS — K7469 Other cirrhosis of liver: Secondary | ICD-10-CM

## 2019-04-20 DIAGNOSIS — E118 Type 2 diabetes mellitus with unspecified complications: Secondary | ICD-10-CM

## 2019-04-20 MED ORDER — CARVEDILOL 3.125 MG PO TABS: 3.1250 mg | ORAL_TABLET | Freq: Two times a day (BID) | ORAL | 3 refills | Status: DC

## 2019-04-20 MED ORDER — TRULICITY 1.5 MG/0.5ML ~~LOC~~ SOAJ: 1.5000 mg | SUBCUTANEOUS | 2 refills | Status: DC

## 2019-04-20 NOTE — Telephone Encounter (Signed)
Patient's Trulicity application approved through [employer]. Rx faxed to [facility], will take 48 hours to process and ship.

## 2019-04-20 NOTE — Progress Notes (Signed)
S: Deborah Pham is a 53 y.o. female reports to clinical pharmacist appointment for DM management follow up.  No Known Allergies  Current Outpatient Medications:  .  Dulaglutide (TRULICITY) 1.5 PQ/3.3AQ SOPN, Inject 1.5 mg into the skin once a week., Disp: 16 pen, Rfl: 2 .  glucose blood (CONTOUR NEXT TEST) test strip, Use as instructed, Disp: 100 each, Rfl: 12 .  nadolol (CORGARD) 20 MG tablet, Take 1 tablet (20 mg total) by mouth daily., Disp: 90 tablet, Rfl: 1 Past Medical History:  Diagnosis Date  . Diabetes mellitus without complication ([place])   . NASH (nonalcoholic steatohepatitis)   . Obesity    Social History   Socioeconomic History  . Marital status: Married    Spouse name: Not on file  . Number of children: 2  . Years of education: Not on file  . Highest education level: 12th grade  Occupational History  . Occupation: housewife  Social Needs  . Financial resource strain: Not on file  . Food insecurity    Worry: Not on file    Inability: Not on file  . Transportation needs    Medical: No    Non-medical: No  Tobacco Use  . Smoking status: Never Smoker  . Smokeless tobacco: Never Used  Substance and Sexual Activity  . Alcohol use: No  . Drug use: No  . Sexual activity: Yes    Birth control/protection: None  Lifestyle  . Physical activity    Days per week: Not on file    Minutes per session: Not on file  . Stress: Not on file  Relationships  . Social Herbalist on phone: Not on file    Gets together: Not on file    Attends religious service: Not on file    Active member of club or organization: Not on file    Attends meetings of clubs or organizations: Not on file    Relationship status: Not on file  Other Topics Concern  . Not on file  Social History Narrative  . Not on file   Family History  Problem Relation Age of Onset  . Liver disease Father   . Alcoholism Father   . Diabetes Mother   . Colon cancer Neg Hx   . Breast  cancer Neg Hx    O:    Component Value Date/Time   CHOL 162 06/18/2008 1528   HDL 70 06/18/2008 1528   LDLCALC 67 06/18/2008 1528   TRIG 124 06/18/2008 1528   GLUCOSE 375 (H) 07/04/2018 1459   HGBA1C 8.3 (A) 03/17/2019 1030   HGBA1C 8.9 (H) 06/22/2016 0918   NA 134 (L) 07/04/2018 1459   NA 141 02/21/2017 1603   K 3.9 07/04/2018 1459   CL 104 07/04/2018 1459   CO2 24 07/04/2018 1459   BUN 11 07/04/2018 1459   BUN 7 02/21/2017 1603   CREATININE 0.89 07/04/2018 1459   CALCIUM 9.0 07/04/2018 1459   GFRNONAA 100 02/21/2017 1603   GFRAA 116 02/21/2017 1603   AST 31 06/11/2018 1626   ALT 23 06/11/2018 1626   WBC 5.6 06/11/2018 1626   HGB 16.1 (H) 06/11/2018 1626   HGB 14.9 02/21/2017 1603   HCT 45.1 06/11/2018 1626   HCT 40.4 02/21/2017 1603   PLT 86.0 (L) 06/11/2018 1626   PLT 96 (LL) 02/21/2017 1603   TSH 1.400 11/28/2017 1534   Ht Readings from Last 2 Encounters:  03/17/19 4' 11"  (1.499 m)  11/05/18 4' 11"  (1.499  m)   Wt Readings from Last 2 Encounters:  03/17/19 230 lb 1.6 oz (104.4 kg)  11/11/18 226 lb (102.5 kg)   There is no height or weight on file to calculate BMI. BP Readings from Last 3 Encounters:  03/17/19 (!) 119/58  11/11/18 110/64  11/05/18 (!) 111/57   A/P: Patient reports for follow up DM management in consideration for hepatic cirrhosis. Last month, patient was switched from glimepiride to Trulicity (dulaglutide) 0.75 mg weekly and reports no side effects or symptoms of concern. She did bring home BG meter which shows average home BG in the 180s. Patient is awaiting ongoing Trulicity access through the [employer] Patient Assistance Program. Patient was advised that once approved, Trulicity dose will increase to 1.5 mg weekly and will be shipped to home.  She also requested assistance with nadolol. Discussed with Dr. Wilfrid Lund, and patient was approved to switch to carvedilol for affordability ([place] medassist application pending). Patient education was  provided and patient advised to follow up if any changes in condition or questions regarding medications arise. She verbalized understanding

## 2019-04-22 ENCOUNTER — Other Ambulatory Visit (HOSPITAL_COMMUNITY): Payer: Self-pay | Admitting: *Deleted

## 2019-04-22 DIAGNOSIS — N644 Mastodynia: Secondary | ICD-10-CM

## 2019-04-23 ENCOUNTER — Telehealth: Payer: Self-pay

## 2019-04-23 NOTE — Telephone Encounter (Signed)
Patient has been approved for [place] [facility] through August 2021.

## 2019-05-05 ENCOUNTER — Telehealth: Payer: Self-pay | Admitting: *Deleted

## 2019-05-05 NOTE — Telephone Encounter (Signed)
WALK IN Pt given trulicity samples with her name on box

## 2019-05-07 ENCOUNTER — Ambulatory Visit (HOSPITAL_COMMUNITY)
Admission: RE | Admit: 2019-05-07 | Discharge: 2019-05-07 | Disposition: A | Discharge status: Home / Self Care | Payer: Self-pay | Source: Ambulatory Visit | Attending: Obstetrics and Gynecology | Admitting: Obstetrics and Gynecology

## 2019-05-07 ENCOUNTER — Encounter (HOSPITAL_COMMUNITY): Payer: Self-pay

## 2019-05-07 ENCOUNTER — Other Ambulatory Visit: Payer: Self-pay

## 2019-05-07 ENCOUNTER — Ambulatory Visit
Admission: RE | Admit: 2019-05-07 | Discharge: 2019-05-07 | Disposition: A | Discharge status: Home / Self Care | Payer: No Typology Code available for payment source | Source: Ambulatory Visit | Attending: Obstetrics and Gynecology | Admitting: Obstetrics and Gynecology

## 2019-05-07 ENCOUNTER — Ambulatory Visit: Payer: No Typology Code available for payment source

## 2019-05-07 DIAGNOSIS — N644 Mastodynia: Secondary | ICD-10-CM

## 2019-05-07 DIAGNOSIS — Z1239 Encounter for other screening for malignant neoplasm of breast: Secondary | ICD-10-CM | POA: Insufficient documentation

## 2019-05-07 NOTE — Patient Instructions (Signed)
Explained breast self awareness with Chilton Si. Patient did not /need a Pap smear today due to last Pap smear and HPV typing was 01/10/2017. Let her know BCCCP will cover Pap smears and HPV typing every 5 years unless has a history of abnormal Pap smears. Referred patient to the [facility] for a left breast diagnostic mammogram. Appointment scheduled for Thursday, May 07, 2019 at 0920. Patient aware of appointment and will be there. [place] verbalized understanding.  Maynor Mwangi, Arvil [place], RN 9:41 AM

## 2019-05-07 NOTE — Progress Notes (Signed)
Complaints of left upper breast pain for over a month that comes and goes. Patient rates the pain at a 6 out of 10.  Pap Smear: Pap smear not completed today. Last Pap smear was 5/17/2018at [facility] and normal with negative HPV. Per patient has no history of an abnormal Pap smear. Last Pap smear result is in EPIC.  Physical exam: Breasts Breasts symmetrical. No skin abnormalities bilateral breasts. No nipple retraction bilateral breasts. No nipple discharge bilateral breasts. No lymphadenopathy. No lumps palpated bilateral breasts. No complaints of pain or tenderness on exam. Referred patient to the [facility] for a left breast diagnostic mammogram. Appointment scheduled for Thursday, May 07, 2019 at 0920.        Pelvic/Bimanual No Pap smear completed today since last Pap smear and HPV typing was5/17/2018. Pap smear not indicated per BCCCP guidelines.  Smoking History: Patient has never smoked.  Patient Navigation: Patient education provided. Access to services provided for patient through [facility] program. Spanish interpreter provided.   Colorectal Cancer Screening: Per patient has never had a colonoscopy completed. FIT Test completed 01/14/2017 and negative. No complaints today.   Breast and Cervical Cancer Risk Assessment: Patient has no family history of breast cancer, known genetic mutations, or radiation treatment to the chest before age 62. Patient has no history of cervical dysplasia, immunocompromised, or DES exposure in-utero.  Risk Assessment    No risk assessment data for the current encounter   Risk Scores      11/11/2018   Last edited by: Armond Hang, LPN   5-year risk: 0.7 %   Lifetime risk: 5.9 %         Used Spanish interpreter Rudene Anda from [place].

## 2019-05-08 ENCOUNTER — Ambulatory Visit: Payer: No Typology Code available for payment source | Admitting: Pharmacist

## 2019-05-08 DIAGNOSIS — E118 Type 2 diabetes mellitus with unspecified complications: Secondary | ICD-10-CM

## 2019-05-08 NOTE — Progress Notes (Signed)
S: Deborah Pham is a 53 y.o. female reports to clinical pharmacist appointment for DM medication follow up. Patient did not bring medication bottles. Patient is accompanied by family, who assist at home with medication management.  No Known Allergies  Current Outpatient Medications:  .  carvedilol (COREG) 3.125 MG tablet, Take 1 tablet (3.125 mg total) by mouth 2 (two) times daily with a meal. (Patient not taking: Reported on 05/07/2019), Disp: 180 tablet, Rfl: 3 .  Dulaglutide (TRULICITY) 1.5 BT/5.1VO SOPN, Inject 1.5 mg into the skin once a week., Disp: 16 pen, Rfl: 2 .  glucose blood (CONTOUR NEXT TEST) test strip, Use as instructed, Disp: 100 each, Rfl: 12 .  nadolol (CORGARD) 20 MG tablet, Take 1 tablet (20 mg total) by mouth daily., Disp: 90 tablet, Rfl: 1 Past Medical History:  Diagnosis Date  . Diabetes mellitus without complication ([place])   . NASH (nonalcoholic steatohepatitis)   . Obesity    Social History   Socioeconomic History  . Marital status: Married    Spouse name: Not on file  . Number of children: 2  . Years of education: Not on file  . Highest education level: 12th grade  Occupational History  . Occupation: housewife  Social Needs  . Financial resource strain: Not on file  . Food insecurity    Worry: Not on file    Inability: Not on file  . Transportation needs    Medical: No    Non-medical: No  Tobacco Use  . Smoking status: Never Smoker  . Smokeless tobacco: Never Used  Substance and Sexual Activity  . Alcohol use: No  . Drug use: No  . Sexual activity: Yes    Birth control/protection: None  Lifestyle  . Physical activity    Days per week: Not on file    Minutes per session: Not on file  . Stress: Not on file  Relationships  . Social Herbalist on phone: Not on file    Gets together: Not on file    Attends religious service: Not on file    Active member of club or organization: Not on file    Attends meetings of clubs or  organizations: Not on file    Relationship status: Not on file  Other Topics Concern  . Not on file  Social History Narrative  . Not on file   Family History  Problem Relation Age of Onset  . Liver disease Father   . Alcoholism Father   . Diabetes Mother   . Colon cancer Neg Hx   . Breast cancer Neg Hx     O:    Component Value Date/Time   CHOL 162 06/18/2008 1528   HDL 70 06/18/2008 1528   LDLCALC 67 06/18/2008 1528   TRIG 124 06/18/2008 1528   GLUCOSE 375 (H) 07/04/2018 1459   HGBA1C 8.3 (A) 03/17/2019 1030   HGBA1C 8.9 (H) 06/22/2016 0918   NA 134 (L) 07/04/2018 1459   NA 141 02/21/2017 1603   K 3.9 07/04/2018 1459   CL 104 07/04/2018 1459   CO2 24 07/04/2018 1459   BUN 11 07/04/2018 1459   BUN 7 02/21/2017 1603   CREATININE 0.89 07/04/2018 1459   CALCIUM 9.0 07/04/2018 1459   GFRNONAA 100 02/21/2017 1603   GFRAA 116 02/21/2017 1603   AST 31 06/11/2018 1626   ALT 23 06/11/2018 1626   WBC 5.6 06/11/2018 1626   HGB 16.1 (H) 06/11/2018 1626   HGB 14.9 02/21/2017  1603   HCT 45.1 06/11/2018 1626   HCT 40.4 02/21/2017 1603   PLT 86.0 (L) 06/11/2018 1626   PLT 96 (LL) 02/21/2017 1603   TSH 1.400 11/28/2017 1534   Ht Readings from Last 2 Encounters:  03/17/19 4' 11"  (1.499 m)  11/05/18 4' 11"  (1.499 m)   Wt Readings from Last 2 Encounters:  05/07/19 225 lb (102.1 kg)  03/17/19 230 lb 1.6 oz (104.4 kg)   There is no height or weight on file to calculate BMI. BP Readings from Last 3 Encounters:  05/07/19 (!) 152/104  03/17/19 (!) 119/58  11/11/18 110/64     A/P: A drug regimen assessment was performed, including review of allergies, interactions, disease-state management, dosing and immunization history. Medications were reviewed with the patient, including name, instructions, indication, goals of therapy, potential side effects, importance of adherence, and safe use.   Findings/Recommendations:   Education - patient informed of importance of adherence,  home blood glucose monitoring.   Drug cost - patient informed of PAP approval, received Trulicity from the manufacturer  An after visit summary was provided and patient advised to follow up in 1 week (telephone visit) or sooner if any changes in condition or questions regarding medications arise.   The patient verbalized understanding of information provided by repeating back concepts discussed.   30 minutes spent face-to-face with the patient during the encounter. 100% of time spent on education.

## 2019-05-11 ENCOUNTER — Encounter (HOSPITAL_COMMUNITY): Payer: Self-pay | Admitting: *Deleted

## 2019-05-15 MED ORDER — JARDIANCE 10 MG PO TABS: 10.0000 mg | ORAL_TABLET | Freq: Every day | ORAL | 3 refills | Status: DC

## 2019-05-15 NOTE — Telephone Encounter (Signed)
Diabetes Management Follow Up Deborah Pham is a 53 y.o. female who was contacted for DM management. Identity was verified using date of birth and address.  Diabetes medications: Trulicity 1.5 mg weekly; patient correctly reports DM regimen and adherence  Home BG average 250 mg/dL (correlates with A1C of around 10.3%) Denies hypoglycemia symptoms Denies hyperglycemia symptoms   A/P Blood glucose control: poor  Reviewed appropriate home blood glucose monitoring  Called to follow up with patient after Trulicity start. Patient is tolerating but reports some constipation. Recommended to the patient that she increase the fiber in her diet or she can try to manage with OTC Miralax. Patient was agreable to these recommendations and stated that the constipation was not intolerable. Will continue to follow up regarding efficacy.   Advised to contact clinic or seek medical attention if concerns or symptoms arise.  The patient verbalized understanding of information provided by repeating back concepts discussed.  Follow-up No follow-ups on file.  Brenton Grills

## 2019-05-19 ENCOUNTER — Ambulatory Visit (HOSPITAL_COMMUNITY): Payer: No Typology Code available for payment source

## 2019-05-20 ENCOUNTER — Ambulatory Visit: Payer: No Typology Code available for payment source

## 2019-05-22 ENCOUNTER — Telehealth: Payer: Self-pay

## 2019-05-22 NOTE — Telephone Encounter (Signed)
LVM through interpreter service to schedule appt for 06/01/19   Brenton Grills, [place] 05/22/2019 3:30 PM

## 2019-05-25 NOTE — Telephone Encounter (Signed)
Called pt to schedule appt with husband on 06/01/19 at 10 am. Patient will come as well to discuss Jardiance and access. BG are still ranging from 250-280 and is still feeling some side effects (constipation, headache) from Trulicity. Patient requests that an interpreter is used at the visit.   Brenton Grills, Student-PharmD 05/25/2019 10:03 AM

## 2019-05-27 ENCOUNTER — Inpatient Hospital Stay: Payer: Self-pay | Attending: Obstetrics and Gynecology | Admitting: *Deleted

## 2019-05-27 ENCOUNTER — Telehealth: Payer: Self-pay | Admitting: Gastroenterology

## 2019-05-27 ENCOUNTER — Other Ambulatory Visit: Payer: Self-pay

## 2019-05-27 ENCOUNTER — Other Ambulatory Visit: Payer: Self-pay | Admitting: Obstetrics and Gynecology

## 2019-05-27 VITALS — BP 143/73 | Temp 96.9°F | Ht 60.0 in | Wt 222.5 lb

## 2019-05-27 DIAGNOSIS — Z0000 Encounter for general adult medical examination without abnormal findings: Secondary | ICD-10-CM

## 2019-05-27 NOTE — Telephone Encounter (Signed)
Spoke with the patient using the telephone interpreting service. The patient stated she was taken off the Nadolol that Dr. Loletha Carrow placed her on and has been placed on Carvedilol instead. The patient is concerned about the medication change. She stated she was on the Nadolol for her cirrhosis but the Carvedilol is for her blood pressure and she is under the assumption that this medication will not help her cirrhosis. The patient never came in for her 3 month follow up so the patient has been scheduled with Dr. Loletha Carrow on 11/2 at 2:00 pm for a in person OV. The patient is highly worried Dr. Edison Pace incorrectly took her off of the medication Dr. Loletha Carrow prescribed. Please advise.

## 2019-05-27 NOTE — Telephone Encounter (Signed)
Please reassure her that this medication change is correct.  They had been messaging me about it and had to make a substitution due to cost and medication availability.  Yes, carvedilol is used for heart conditions and high blood pressure.  But in this case, as with the nadolol, we use it to help with the enlarged esophageal blood vessels due to the cirrhosis.

## 2019-05-27 NOTE — Telephone Encounter (Signed)
Pt called to inform that Dr. Edison Pace took her off of Nadolol and put her on carvedilol instead. Pt was under the impression that carvedilol was for her cirrhosis. However, she found out today that it is for BP so she is confused, she states that she does not have any issue with BP or her heart. This happened 15 days ago, pt is concerned that she has been taking the wrong medication. Pls call her.

## 2019-05-27 NOTE — Telephone Encounter (Signed)
Spoke with the patient again using there telephonic interpreting service to reassure her that the use of the carvedilol instead of the nadolol was an appropriate substitution. Patient verbalized understanding. No other complaints or concerns expressed at the conclusion of the phone call.

## 2019-05-27 NOTE — Progress Notes (Signed)
[facility] initial screening   interpreter- Rudene Anda, [employer]   Clinical Measurement:  Height: 60 in Weight: 222.5 lb  Blood Pressure: 147/84  Blood Pressure #2: 143/73 Fasting Labs Drawn Today, will review with patient when they result.   Medical History:  Patient states that she does not have a history of high cholesterol. Patient states that she does have a history of high blood pressure and diabetes.  Medications:  Patient states that she takes medication to lower blood sugar. Patient does not take medication to lower cholesterol or blood pressure. Patient does not take an aspirin a day to help prevent a heart attack or stroke. During the past 7 days patient has taken prescribed medication to lower blood sugar on 7 days.   Blood pressure, self measurement: Patient states that she does not measure blood pressure from home.   Nutrition: Patient states that on average she eats 2 cups of fruit and 1cup of vegetables per day. Patient states that she does not eat fish at least 2 times per week. Patient eats about half servings of whole grains. Patient drinks less than 36 ounces of beverages with added sugar weekly. Patient is currently watching sodium or salt intake. In the past 7 days patient has not had any drinks containing alcohol. On average patient does not drink any drinks containing alcohol.      Physical activity:  Patient states that she gets 75 minutes of moderate and 0 minutes of vigorous physical activity each week.  Smoking status:  Patient states that she has never smoked tobacco.   Quality of life:  Over the past 2 weeks patient states that she has not had any days where she has little interest or pleasure in doing things and 0 days where she has felt down, depressed or hopeless.    Risk reduction and counseling:    Health Coaching: Spoke with patient about changes that she could make with her diet. Encouraged her to try and eat 3 cups of vegetables per day. Discussed adding  heart healthy fish into diet like salmon and tuna. Encouraged patient to continue watching the amounts of sweets and beverages with added sugars that she consumes given her history with diabetes. Also encouraged patient to try and walk for at least 20 minutes a day.   Navigation:  I will notify patient of lab results.  Patient is aware of 2 more health coaching sessions and a follow up.  Time: 25 minutes

## 2019-05-28 LAB — GLUCOSE, RANDOM: Glucose: 228 mg/dL — ABNORMAL HIGH (ref 65–99)

## 2019-05-28 LAB — LIPID PANEL W/O CHOL/HDL RATIO
Cholesterol, Total: 153 mg/dL (ref 100–199)
HDL: 65 mg/dL (ref >39)
LDL Chol Calc (NIH): 67 mg/dL (ref 0–99)
Triglycerides: 123 mg/dL (ref 0–149)
VLDL Cholesterol Cal: 21 mg/dL (ref 5–40)

## 2019-05-28 LAB — HGB A1C W/O EAG: Hgb A1c MFr Bld: 8.8 % — ABNORMAL HIGH (ref 4.8–5.6)

## 2019-05-29 ENCOUNTER — Telehealth: Payer: Self-pay

## 2019-05-29 NOTE — Telephone Encounter (Signed)
Health coaching 2   interpreter- [place] [phone]   Labs-153 cholesterol , 67 LDL cholesterol , 123 triglycerides , 65 HDL cholesterol , 8.8 hemoglobin A1C , 228 mean plasma glucose  Patient understands and is aware of her lab results.   Goals-  Spoke with patient about lab results. Answered any questions patient had regarding results. Explained to patient that since her glucose and hemoglobin A1C were elevated I would be referring her back to her PCP at [facility].   Goals- Add more vegetables into diet for a goal of 3 cups per day. Continue to watch the amount of sweets and beverages with added sugars consumed. Watch and monitor the amount of carbohydrates consumed. Try and walk for 20 minutes a day with a goal of 150 per week.  Navigation:  Patient is aware of 1 more health coaching sessions and a follow up. Will call patient with follow-up appointment information once appointment is scheduled.   Time- 12 minutes

## 2019-06-01 ENCOUNTER — Ambulatory Visit (INDEPENDENT_AMBULATORY_CARE_PROVIDER_SITE_OTHER): Payer: Self-pay | Admitting: Pharmacist

## 2019-06-01 VITALS — BP 116/61 | HR 67

## 2019-06-01 DIAGNOSIS — E118 Type 2 diabetes mellitus with unspecified complications: Secondary | ICD-10-CM

## 2019-06-02 ENCOUNTER — Telehealth (HOSPITAL_COMMUNITY): Payer: Self-pay

## 2019-06-02 LAB — BMP8+ANION GAP
Anion Gap: 11 mmol/L (ref 10.0–18.0)
BUN/Creatinine Ratio: 11 (ref 9–23)
BUN: 8 mg/dL (ref 6–24)
CO2: 20 mmol/L (ref 20–29)
Calcium: 8.7 mg/dL (ref 8.7–10.2)
Chloride: 106 mmol/L (ref 96–106)
Creatinine, Ser: 0.72 mg/dL (ref 0.57–1.00)
GFR calc Af Amer: 110 mL/min/{1.73_m2} (ref >59)
GFR calc non Af Amer: 95 mL/min/{1.73_m2} (ref >59)
Glucose: 385 mg/dL — ABNORMAL HIGH (ref 65–99)
Potassium: 3.7 mmol/L (ref 3.5–5.2)
Sodium: 137 mmol/L (ref 134–144)

## 2019-06-02 NOTE — Telephone Encounter (Signed)
Spoke with patient via [place] [phone]. Gave patient follow-up appt information. Patient is scheduled with [facility] on Oct. 19th @ 2:30 pm for elevated [employer] labs.

## 2019-06-03 NOTE — Progress Notes (Signed)
S: Deborah Pham is a 53 y.o. female reports to clinical pharmacist appointment for diabetes management. Visit was completed with Spanish interpreter, [place], [phone].  No Known Allergies  Current Outpatient Medications:  .  carvedilol (COREG) 3.125 MG tablet, Take 1 tablet (3.125 mg total) by mouth 2 (two) times daily with a meal., Disp: 180 tablet, Rfl: 3 .  Dulaglutide (TRULICITY) 1.5 GB/1.5VV SOPN, Inject 1.5 mg into the skin once a week., Disp: 16 pen, Rfl: 2 .  empagliflozin (JARDIANCE) 10 MG TABS tablet, Take 10 mg by mouth daily before breakfast., Disp: 90 tablet, Rfl: 3 .  glucose blood (CONTOUR NEXT TEST) test strip, Use as instructed, Disp: 100 each, Rfl: 12 .  nadolol (CORGARD) 20 MG tablet, Take 1 tablet (20 mg total) by mouth daily., Disp: 90 tablet, Rfl: 1 Past Medical History:  Diagnosis Date  . Diabetes mellitus without complication ([place])   . Fatty liver   . NASH (nonalcoholic steatohepatitis)   . Obesity    Social History   Socioeconomic History  . Marital status: Married    Spouse name: Not on file  . Number of children: 2  . Years of education: Not on file  . Highest education level: 12th grade  Occupational History  . Occupation: housewife  Social Needs  . Financial resource strain: Not on file  . Food insecurity    Worry: Not on file    Inability: Not on file  . Transportation needs    Medical: No    Non-medical: No  Tobacco Use  . Smoking status: Never Smoker  . Smokeless tobacco: Never Used  Substance and Sexual Activity  . Alcohol use: No  . Drug use: No  . Sexual activity: Yes    Birth control/protection: None  Lifestyle  . Physical activity    Days per week: Not on file    Minutes per session: Not on file  . Stress: Not on file  Relationships  . Social Herbalist on phone: Not on file    Gets together: Not on file    Attends religious service: Not on file    Active member of club or organization: Not on file   Attends meetings of clubs or organizations: Not on file    Relationship status: Not on file  Other Topics Concern  . Not on file  Social History Narrative  . Not on file   Family History  Problem Relation Age of Onset  . Liver disease Father   . Alcoholism Father   . Diabetes Mother   . Colon cancer Neg Hx   . Breast cancer Neg Hx    O:    Component Value Date/Time   CHOL 153 05/27/2019 1021   HDL 65 05/27/2019 1021   LDLCALC 67 05/27/2019 1021   TRIG 123 05/27/2019 1021   GLUCOSE 385 (H) 06/01/2019 1043   GLUCOSE 375 (H) 07/04/2018 1459   HGBA1C 8.8 (H) 05/27/2019 1021   NA 137 06/01/2019 1043   K 3.7 06/01/2019 1043   CL 106 06/01/2019 1043   CO2 20 06/01/2019 1043   BUN 8 06/01/2019 1043   CREATININE 0.72 06/01/2019 1043   CALCIUM 8.7 06/01/2019 1043   GFRNONAA 95 06/01/2019 1043   GFRAA 110 06/01/2019 1043   AST 31 06/11/2018 1626   ALT 23 06/11/2018 1626   WBC 5.6 06/11/2018 1626   HGB 16.1 (H) 06/11/2018 1626   HGB 14.9 02/21/2017 1603   HCT 45.1 06/11/2018 1626  HCT 40.4 02/21/2017 1603   PLT 86.0 (L) 06/11/2018 1626   PLT 96 (LL) 02/21/2017 1603   TSH 1.400 11/28/2017 1534   Ht Readings from Last 2 Encounters:  05/27/19 5' (1.524 m)  03/17/19 4' 11"  (1.499 m)   Wt Readings from Last 2 Encounters:  05/27/19 222 lb 8 oz (100.9 kg)  05/07/19 225 lb (102.1 kg)   There is no height or weight on file to calculate BMI. BP Readings from Last 3 Encounters:  05/27/19 (!) 143/73  05/07/19 (!) 152/104  03/17/19 (!) 119/58   A/P: Diabetes medications: Trulicity (dulaglutide) 1.5 mg weekly; patient correctly reports DM regimen and adherence  Home BG average 280 mg/dL (correlates with A1C of around 11.4%), highest in the afternoon, no BG < 70 mg/dL. Patient reports no signs or symptoms of hyper- or hypo-glycemia. She reports she is interested in discontinuing Trulicity due to GI upset.   Blood glucose control: stable, but not at goal. Discussed risks/benefits  of various DM therapies, will need to exercise caution due to hepatic cirrhosis. Patient selected empagliflozin using shared decision-making. Provided samples and education for empagliflozin (Jardiance 76m March 2022 [id number] qty 2). Due to significantly elevated BG, will likely need additional pharmacotherapy/lifestyle intervention. May consider another GLP-1a at follow up visit, although patient prefers to avoid injectable therapy at this time (could try oral semaglutide, Rybelsus), or DPP-4i.  Advised to contact clinic or seek medical attention if concerns or symptoms arise. Will notify PCP of findings.  The patient verbalized understanding of information provided by repeating back concepts discussed.  Follow-up 1 week  JFlossie Dibble

## 2019-06-08 ENCOUNTER — Ambulatory Visit: Payer: Self-pay | Admitting: Pharmacist

## 2019-06-08 ENCOUNTER — Encounter: Payer: Self-pay | Admitting: Internal Medicine

## 2019-06-08 ENCOUNTER — Other Ambulatory Visit: Payer: Self-pay

## 2019-06-08 DIAGNOSIS — E118 Type 2 diabetes mellitus with unspecified complications: Secondary | ICD-10-CM

## 2019-06-08 MED ORDER — DAPAGLIFLOZIN PROPANEDIOL 5 MG PO TABS: 5.0000 mg | ORAL_TABLET | Freq: Every day | ORAL | 0 refills | Status: DC

## 2019-06-08 NOTE — Progress Notes (Addendum)
S: Deborah Pham is a 53 y.o. female reports to clinical pharmacist appointment for DM management. Patient is accompanied by her spouse, who assists at home in her care. She brought medication bottle to visit.  No Known Allergies  Current Outpatient Medications:  .  carvedilol (COREG) 3.125 MG tablet, Take 1 tablet (3.125 mg total) by mouth 2 (two) times daily with a meal., Disp: 180 tablet, Rfl: 3 .  glucose blood (CONTOUR NEXT TEST) test strip, Use as instructed, Disp: 100 each, Rfl: 12 .  nadolol (CORGARD) 20 MG tablet, Take 1 tablet (20 mg total) by mouth daily., Disp: 90 tablet, Rfl: 1 Past Medical History:  Diagnosis Date  . Diabetes mellitus without complication ([place])   . Fatty liver   . NASH (nonalcoholic steatohepatitis)   . Obesity    Social History   Socioeconomic History  . Marital status: Married    Spouse name: Not on file  . Number of children: 2  . Years of education: Not on file  . Highest education level: 12th grade  Occupational History  . Occupation: housewife  Social Needs  . Financial resource strain: Not on file  . Food insecurity    Worry: Not on file    Inability: Not on file  . Transportation needs    Medical: No    Non-medical: No  Tobacco Use  . Smoking status: Never Smoker  . Smokeless tobacco: Never Used  Substance and Sexual Activity  . Alcohol use: No  . Drug use: No  . Sexual activity: Yes    Birth control/protection: None  Lifestyle  . Physical activity    Days per week: Not on file    Minutes per session: Not on file  . Stress: Not on file  Relationships  . Social Herbalist on phone: Not on file    Gets together: Not on file    Attends religious service: Not on file    Active member of club or organization: Not on file    Attends meetings of clubs or organizations: Not on file    Relationship status: Not on file  Other Topics Concern  . Not on file  Social History Narrative  . Not on file   Family  History  Problem Relation Age of Onset  . Liver disease Father   . Alcoholism Father   . Diabetes Mother   . Colon cancer Neg Hx   . Breast cancer Neg Hx    O:    Component Value Date/Time   CHOL 153 05/27/2019 1021   HDL 65 05/27/2019 1021   LDLCALC 67 05/27/2019 1021   TRIG 123 05/27/2019 1021   GLUCOSE 385 (H) 06/01/2019 1043   GLUCOSE 375 (H) 07/04/2018 1459   HGBA1C 8.8 (H) 05/27/2019 1021   NA 137 06/01/2019 1043   K 3.7 06/01/2019 1043   CL 106 06/01/2019 1043   CO2 20 06/01/2019 1043   BUN 8 06/01/2019 1043   CREATININE 0.72 06/01/2019 1043   CALCIUM 8.7 06/01/2019 1043   GFRNONAA 95 06/01/2019 1043   GFRAA 110 06/01/2019 1043   AST 31 06/11/2018 1626   ALT 23 06/11/2018 1626   WBC 5.6 06/11/2018 1626   HGB 16.1 (H) 06/11/2018 1626   HGB 14.9 02/21/2017 1603   HCT 45.1 06/11/2018 1626   HCT 40.4 02/21/2017 1603   PLT 86.0 (L) 06/11/2018 1626   PLT 96 (LL) 02/21/2017 1603   TSH 1.400 11/28/2017 1534   Ht Readings  from Last 2 Encounters:  05/27/19 5' (1.524 m)  03/17/19 4' 11"  (1.499 m)   Wt Readings from Last 2 Encounters:  05/27/19 222 lb 8 oz (100.9 kg)  05/07/19 225 lb (102.1 kg)   There is no height or weight on file to calculate BMI. BP Readings from Last 3 Encounters:  06/01/19 116/61  05/27/19 (!) 143/73  05/07/19 (!) 152/104    A/P: Diabetes medications: empagliflozin (Jardiance) 10 mg, patient correctly reports DM regimen and adherence  According to home meter, BG average 150 mg/dL (correlates with A1C of around 6.9%). No signs or symptoms of hyper- or hypoglycemia. She states she is pleased with this regimen. Unfortunately, patient did not qualify for Jardiance patient assistance program due to high income. Will try switching to dapagliflozin and apply for assistance.   A/P Blood glucose control: improved, no further changes needed at this time. Will follow up with patient access to dapagliflozin and monitor patient response. PCP appointment  scheduled for 10/19 for further follow up as well.  Advised to contact clinic or seek medical attention if concerns or symptoms arise. Will notify PCP of findings.  The patient verbalized understanding of information provided by repeating back concepts discussed.

## 2019-06-15 ENCOUNTER — Other Ambulatory Visit: Payer: Self-pay

## 2019-06-15 ENCOUNTER — Ambulatory Visit: Payer: Self-pay | Admitting: Internal Medicine

## 2019-06-15 VITALS — BP 126/59 | HR 73 | Temp 98.4°F | Wt 223.8 lb

## 2019-06-29 ENCOUNTER — Ambulatory Visit (INDEPENDENT_AMBULATORY_CARE_PROVIDER_SITE_OTHER): Payer: Self-pay | Admitting: Gastroenterology

## 2019-06-29 ENCOUNTER — Encounter: Payer: Self-pay | Admitting: Gastroenterology

## 2019-06-29 ENCOUNTER — Other Ambulatory Visit (INDEPENDENT_AMBULATORY_CARE_PROVIDER_SITE_OTHER): Payer: Self-pay

## 2019-06-29 VITALS — BP 116/76 | HR 70 | Temp 97.8°F | Ht 59.0 in | Wt 222.0 lb

## 2019-06-29 DIAGNOSIS — R7989 Other specified abnormal findings of blood chemistry: Secondary | ICD-10-CM

## 2019-06-29 DIAGNOSIS — K7460 Unspecified cirrhosis of liver: Secondary | ICD-10-CM

## 2019-06-29 DIAGNOSIS — K7581 Nonalcoholic steatohepatitis (NASH): Secondary | ICD-10-CM

## 2019-06-29 DIAGNOSIS — I8510 Secondary esophageal varices without bleeding: Secondary | ICD-10-CM

## 2019-06-29 LAB — PROTIME-INR
INR: 1.2 ratio — ABNORMAL HIGH (ref 0.8–1.0)
Prothrombin Time: 13.7 s — ABNORMAL HIGH (ref 9.6–13.1)

## 2019-06-29 NOTE — Patient Instructions (Signed)
If you are age 54 or older, your body mass index should be between 23-30. Your Body mass index is 44.84 kg/m. If this is out of the aforementioned range listed, please consider follow up with your Primary Care Provider.  If you are age 60 or younger, your body mass index should be between 19-25. Your Body mass index is 44.84 kg/m. If this is out of the aformentioned range listed, please consider follow up with your Primary Care Provider.   Your provider has requested that you go to the basement level for lab work before leaving today. Press "B" on the elevator. The lab is located at the first door on the left as you exit the elevator.  You have been scheduled for an abdominal ultrasound at [facility] (1st floor of hospital) on 07-06-2019 at 930am. Please arrive 15 minutes prior to your appointment for registration. Make certain not to have anything to eat or drink 6 hours prior to your appointment. Should you need to reschedule your appointment, please contact radiology at [phone]. This test typically takes about 30 minutes to perform.  It was a pleasure to see you today!  Dr. Loletha Carrow

## 2019-06-29 NOTE — Progress Notes (Signed)
[facility] Progress Note  Chief Complaint: Cirrhosis  Subjective  History: Last seen October 2019 for NASH- related cirrhosis, on primary prophylaxis for nonbleeding esophageal varices.  Earlier this year she was changed from nadolol to carvedilol for insurance reasons, this was communicated to me through pharmacy support staff.  When I saw her last year she had mild peripheral edema and was started on low-dose Aldactone and furosemide.  [name] was seen with the Spanish interpreter Rosemarie Ax) today.  She no longer has peripheral edema.  She denies abdominal pain, nausea, vomiting, hematemesis, black tarry stool.  She has some questions about her carvedilol and why it is substituted for nadolol.  Past Medical History:  Diagnosis Date  . Diabetes mellitus without complication ([place])   . Fatty liver   . NASH (nonalcoholic steatohepatitis)   . Obesity     ROS: Cardiovascular:  no chest pain Respiratory: no dyspnea Remainder of systems negative except as above  The patient's Past Medical, Family and Social History were reviewed and are on file in the EMR.  Objective:  Med list reviewed  Current Outpatient Medications:  .  carvedilol (COREG) 3.125 MG tablet, Take 1 tablet (3.125 mg total) by mouth 2 (two) times daily with a meal., Disp: 180 tablet, Rfl: 3 .  dapagliflozin propanediol (FARXIGA) 5 MG TABS tablet, Take 5 mg by mouth daily before breakfast., Disp: 30 tablet, Rfl: 0 .  glucose blood (CONTOUR NEXT TEST) test strip, Use as instructed, Disp: 100 each, Rfl: 12   Vital signs in last 24 hrs: Vitals:   06/29/19 1404  BP: 116/76  Pulse: 70  Temp: 97.8 F (36.6 C)    Physical Exam  Ambulatory, steady gait, speech fluent, gets on exam table without difficulty.  HEENT: sclera anicteric, oral mucosa moist without lesions  Neck: supple, no thyromegaly, JVD or lymphadenopathy  Cardiac: RRR without murmurs, S1S2 heard, no peripheral edema  Pulm: clear to  auscultation bilaterally, normal RR and effort noted  Abdomen: soft, no tenderness, with active bowel sounds.  Assessment for mass or hepatosplenomegaly limited by morbid obesity  Skin; warm and dry, no jaundice or rash Neuro: Normal gross motor function and fluent speech, no asterixis Recent Labs:  CBC Latest Ref Rng & Units 06/11/2018 02/21/2017 06/23/2016  WBC 4.0 - 10.5 K/uL 5.6 4.6 12.6(H)  Hemoglobin 12.0 - 15.0 g/dL 16.1(H) 14.9 14.0  Hematocrit 36.0 - 46.0 % 45.1 40.4 38.3  Platelets 150.0 - 400.0 K/uL 86.0(L) 96(LL) 127(L)   CMP Latest Ref Rng & Units 06/01/2019 05/27/2019 07/04/2018  Glucose 65 - 99 mg/dL 385(H) 228(H) 375(H)  BUN 6 - 24 mg/dL 8 - 11  Creatinine 0.57 - 1.00 mg/dL 0.72 - 0.89  Sodium 134 - 144 mmol/L 137 - 134(L)  Potassium 3.5 - 5.2 mmol/L 3.7 - 3.9  Chloride 96 - 106 mmol/L 106 - 104  CO2 20 - 29 mmol/L 20 - 24  Calcium 8.7 - 10.2 mg/dL 8.7 - 9.0  Total Protein 6.0 - 8.3 g/dL - - -  Total Bilirubin 0.2 - 1.2 mg/dL - - -  Alkaline Phos 39 - 117 U/L - - -  AST 0 - 37 U/L - - -  ALT 0 - 35 U/L - - -   Most recent hemoglobin A1c = 8.8 in September.  Last INR 1.2 in Oct 2019  AFP elevated at 8.0 in October 2019, 9.5 in April 2019, 7.4 in June 2018  Radiologic studies: Most recent ultrasound:  CLINICAL DATA:  History of cirrhosis. Elevated liver function tests.   EXAM: ULTRASOUND ABDOMEN LIMITED RIGHT UPPER QUADRANT   COMPARISON:  CT abdomen and pelvis 06/21/2016. Right upper quadrant ultrasound 06/07/2017.   FINDINGS: Gallbladder:   No gallstones or wall thickening visualized. No sonographic Murphy sign noted by sonographer.   Common bile duct:   Diameter: 0.3 cm.   Liver:   No focal lesion. The liver is shrunken with a nodular border consistent with cirrhosis as seen on the prior exams. No ascites is seen. Portal vein is patent on color Doppler imaging with normal direction of blood flow towards the liver.   IMPRESSION: Cirrhotic  liver.  No focal abnormality.   Normal gallbladder.     Electronically Signed   By: Inge Rise M.D.   On: 11/29/2017 16:24     @ASSESSMENTPLANBEGIN @ Assessment: Encounter Diagnoses  Name Primary?  . Liver cirrhosis secondary to NASH ([place]) Yes  . Secondary esophageal varices without bleeding ([place])   . Elevated LFTs    Compensated NASH related cirrhosis. Nadolol was changed to carvedilol for lower cost on the suggestion of a pharmacist who has been assisting with her case. She and I discussed its indication to decrease the chance of variceal bleeding. We also discussed the need for 2000 mg daily sodium restriction.  Hopefully, better control of her diabetes can be achieved, both for decreased cardiovascular risk and for her liver condition.  Plan: AFP, INR today Right upper quadrant ultrasound scheduled to screen for liver mass. Advised to see her primary care provider or go to a local pharmacy for a flu shot. See me in 6 months or sooner as needed.  Total time 25 minutes, over half spent face-to-face with patient in counseling and coordination of care.   Deborah Pham

## 2019-06-30 LAB — AFP TUMOR MARKER: AFP-Tumor Marker: 10.8 ng/mL — ABNORMAL HIGH

## 2019-07-06 ENCOUNTER — Other Ambulatory Visit: Payer: Self-pay

## 2019-07-06 ENCOUNTER — Ambulatory Visit (HOSPITAL_COMMUNITY)
Admission: RE | Admit: 2019-07-06 | Discharge: 2019-07-06 | Disposition: A | Discharge status: Home / Self Care | Payer: Self-pay | Source: Ambulatory Visit | Attending: Gastroenterology | Admitting: Gastroenterology

## 2019-07-06 DIAGNOSIS — R7989 Other specified abnormal findings of blood chemistry: Secondary | ICD-10-CM | POA: Insufficient documentation

## 2019-07-06 DIAGNOSIS — K7581 Nonalcoholic steatohepatitis (NASH): Secondary | ICD-10-CM | POA: Insufficient documentation

## 2019-07-06 DIAGNOSIS — K7460 Unspecified cirrhosis of liver: Secondary | ICD-10-CM | POA: Insufficient documentation

## 2019-07-06 DIAGNOSIS — I8510 Secondary esophageal varices without bleeding: Secondary | ICD-10-CM | POA: Insufficient documentation

## 2019-07-09 ENCOUNTER — Ambulatory Visit (INDEPENDENT_AMBULATORY_CARE_PROVIDER_SITE_OTHER): Payer: Self-pay | Admitting: Internal Medicine

## 2019-07-09 ENCOUNTER — Encounter: Payer: Self-pay | Admitting: Internal Medicine

## 2019-07-09 VITALS — BP 128/59 | HR 69 | Temp 98.0°F | Ht 59.0 in | Wt 221.9 lb

## 2019-07-09 DIAGNOSIS — Z0000 Encounter for general adult medical examination without abnormal findings: Secondary | ICD-10-CM

## 2019-07-09 DIAGNOSIS — Z23 Encounter for immunization: Secondary | ICD-10-CM

## 2019-07-09 DIAGNOSIS — Z7984 Long term (current) use of oral hypoglycemic drugs: Secondary | ICD-10-CM

## 2019-07-09 DIAGNOSIS — E118 Type 2 diabetes mellitus with unspecified complications: Secondary | ICD-10-CM

## 2019-07-09 LAB — GLUCOSE, CAPILLARY: Glucose-Capillary: 167 mg/dL — ABNORMAL HIGH (ref 70–99)

## 2019-07-09 NOTE — Patient Instructions (Signed)
Deborah Pham,   It was a pleasure to meet you today! Today we discussed your diabetes medication, liver medication, and your fecal sample for colon cancer screening. Today you will get some blood work for your sugars and you can fill your fecal sample and turn it in at your earliest convenience. I look forward to working with you again.  Sincerely,  [name]

## 2019-07-09 NOTE — Progress Notes (Signed)
   CC: Diabetes Management  HPI:  [name] is a 53 y.o. female, with a PMH noted below who presents to the clinic to establish care and follow up on her diabetes management. To see the management of her acute and chronic conditions, please see the A&P note.   Past Medical History:  Diagnosis Date  . Diabetes mellitus without complication ([place])   . Fatty liver   . NASH (nonalcoholic steatohepatitis)   . Obesity    Review of Systems:   Review of Systems  Constitutional: Positive for weight loss. Negative for chills, fever and malaise/fatigue.       Patient reports purposeful weight loss.  HENT: Negative for hearing loss and tinnitus.   Eyes: Negative for blurred vision, double vision and photophobia.  Respiratory: Negative for cough and hemoptysis.   Cardiovascular: Negative for chest pain, palpitations and orthopnea.  Gastrointestinal: Negative for abdominal pain, constipation, diarrhea, heartburn, nausea and vomiting.  Genitourinary: Negative for dysuria, frequency and urgency.  Neurological: Negative for dizziness, tingling, tremors and headaches.  Endo/Heme/Allergies: Negative for environmental allergies. Does not bruise/bleed easily.    Physical Exam:  Vitals:   07/09/19 1406  BP: (!) 128/59  Pulse: 69  Temp: 98 F (36.7 C)  TempSrc: Oral  SpO2: 97%  Weight: 221 lb 14.4 oz (100.7 kg)  Height: 4' 11"  (1.499 m)   Physical Exam Constitutional:      General: She is not in acute distress.    Appearance: Normal appearance. She is normal weight. She is not ill-appearing or toxic-appearing.  HENT:     Head: Normocephalic and atraumatic.  Cardiovascular:     Rate and Rhythm: Normal rate and regular rhythm.     Pulses: Normal pulses.     Heart sounds: Normal heart sounds. No murmur. No friction rub. No gallop.   Pulmonary:     Effort: Pulmonary effort is normal.     Breath sounds: Normal breath sounds. No wheezing, rhonchi or rales.  Abdominal:   General: Abdomen is flat. Bowel sounds are normal. There is no distension.     Tenderness: There is no abdominal tenderness. There is no guarding.  Musculoskeletal:        General: No swelling or tenderness.     Right lower leg: No edema.     Left lower leg: No edema.  Skin:    General: Skin is warm and dry.  Neurological:     General: No focal deficit present.     Mental Status: She is alert and oriented to person, place, and time.  Psychiatric:        Mood and Affect: Mood normal.        Behavior: Behavior normal.     Assessment & Plan:   See Encounters Tab for problem based charting.  Patient seen with Dr. Philipp Ovens

## 2019-07-10 ENCOUNTER — Encounter: Payer: Self-pay | Admitting: Internal Medicine

## 2019-07-10 NOTE — Assessment & Plan Note (Addendum)
Patient presents to the clinic on a new medication, Fargixa 5 mg. Patient was previously on Jardiance, but did not qualify for financial  assistance.Patient states that she has experienced no side effects of the medication. She states that her sugars are trending in the 120-160 range. On interrogation of her glucometer her ranges were in the 119-162. Patient is satisfied with her medications and endorses weight loss with no hypoglycemic episodes.   Plan:  - Continue Fargixa 5 mg.  - CBG today - Continue to monitor patient's chronic condition.  - Diabetic Foot Exam ordered.

## 2019-07-10 NOTE — Assessment & Plan Note (Addendum)
Patient presents to the clinic after her her immunochemical fecal occult blood sample was cancelled due to an expired container. Patient states that she understands, and is willing to complete the test again.  - Reordered fecal occult blood, immunochemical test.  - Patient instructed to fill sample and return it at her earliest convenience.

## 2019-07-13 NOTE — Progress Notes (Signed)
Internal Medicine Clinic Attending  I saw and evaluated the patient.  I personally confirmed the key portions of the history and exam documented by Dr. Winters and I reviewed pertinent patient test results.  The assessment, diagnosis, and plan were formulated together and I agree with the documentation in the resident's note.  

## 2019-10-01 ENCOUNTER — Telehealth: Payer: Self-pay

## 2019-10-01 NOTE — Telephone Encounter (Signed)
Left message for patient about completing HC 3 for the [facility] program. Left name and number for patient to call back.

## 2019-10-12 ENCOUNTER — Encounter: Payer: Self-pay | Admitting: Internal Medicine

## 2019-10-12 ENCOUNTER — Other Ambulatory Visit: Payer: Self-pay

## 2019-10-12 ENCOUNTER — Ambulatory Visit (INDEPENDENT_AMBULATORY_CARE_PROVIDER_SITE_OTHER): Payer: Self-pay | Admitting: Internal Medicine

## 2019-10-12 VITALS — BP 137/60 | HR 74 | Temp 98.6°F | Ht 59.0 in | Wt 219.0 lb

## 2019-10-12 DIAGNOSIS — E118 Type 2 diabetes mellitus with unspecified complications: Secondary | ICD-10-CM

## 2019-10-12 DIAGNOSIS — Z7984 Long term (current) use of oral hypoglycemic drugs: Secondary | ICD-10-CM

## 2019-10-12 DIAGNOSIS — M79643 Pain in unspecified hand: Secondary | ICD-10-CM | POA: Insufficient documentation

## 2019-10-12 DIAGNOSIS — M79641 Pain in right hand: Secondary | ICD-10-CM

## 2019-10-12 DIAGNOSIS — M79642 Pain in left hand: Secondary | ICD-10-CM

## 2019-10-12 LAB — POCT GLYCOSYLATED HEMOGLOBIN (HGB A1C): Hemoglobin A1C: 6.6 % — AB (ref 4.0–5.6)

## 2019-10-12 LAB — GLUCOSE, CAPILLARY: Glucose-Capillary: 211 mg/dL — ABNORMAL HIGH (ref 70–99)

## 2019-10-12 MED ORDER — DAPAGLIFLOZIN PROPANEDIOL 5 MG PO TABS: 5.0000 mg | ORAL_TABLET | Freq: Every day | ORAL | 0 refills | Status: DC

## 2019-10-12 MED ORDER — CONTOUR NEXT TEST VI STRP: ORAL_STRIP | 12 refills | Status: DC

## 2019-10-12 NOTE — Assessment & Plan Note (Signed)
Patient on Farxiga with a HbA1c of 6.6 today which is an improvement from her 8.8 4 months ago. Patient states she is compliant with medication and happy with the results.  - continue medications - Follow up in 3 months.

## 2019-10-12 NOTE — Assessment & Plan Note (Signed)
Patient recently started working again. Patient complains of bilateral hand pain. 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 She states that ibuprofen alleviates the pain, and that nothing aggravates the pain. She has no complaints of numbness, paraesthesia, or weakness. Her physical exam findings revealed a negative Phalen and tinel sign. Her finger strength was 5/5. Her other findings were unremarkable. Her pain is likely secondary to overuse.  - Continue Ibuprofen - Educated patient on adverse effects of high dose NSAID use including gastric ulcers. Patient voiced understaning.  - Follow up BMP to check creatinine on next visit.

## 2019-10-12 NOTE — Progress Notes (Signed)
   CC: Diabetes Follow up   HPI:  [name] is a 54 y.o. female, with a PMH appreciated below who comes to the clinic for a follow up on her diabetes. To see the acute and chronic management of her conditions, please see the attached assessment and plan in the encounters tab.   Past Medical History:  Diagnosis Date  . Diabetes mellitus without complication ([place])   . Fatty liver   . NASH (nonalcoholic steatohepatitis)   . Obesity    Review of Systems:   Review of Systems  Constitutional: Negative for chills and fever.  Eyes: Negative for blurred vision and double vision.  Respiratory: Negative for shortness of breath.   Cardiovascular: Negative for chest pain and palpitations.  Gastrointestinal: Negative for abdominal pain, constipation, diarrhea, nausea and vomiting.  Musculoskeletal: Negative for back pain, joint pain, myalgias and neck pain.       Bilateral hand pain.   Skin: Negative for itching and rash.  Neurological: Negative for dizziness, tingling, tremors, focal weakness, weakness and headaches.     Physical Exam:  Vitals:   10/12/19 1519  BP: 137/60  Pulse: 74  Temp: 98.6 F (37 C)  TempSrc: Oral  SpO2: 98%  Weight: 219 lb (99.3 kg)  Height: 4' 11"  (1.499 m)   Physical Exam Vitals and nursing note reviewed.  Constitutional:      General: She is not in acute distress.    Appearance: Normal appearance. She is not ill-appearing or toxic-appearing.  HENT:     Head: Normocephalic and atraumatic.  Eyes:     General:        Right eye: No discharge.        Left eye: No discharge.     Conjunctiva/sclera: Conjunctivae normal.  Cardiovascular:     Rate and Rhythm: Normal rate and regular rhythm.     Pulses: Normal pulses.     Heart sounds: Normal heart sounds. No murmur. No friction rub. No gallop.   Pulmonary:     Effort: Pulmonary effort is normal.     Breath sounds: Normal breath sounds. No wheezing, rhonchi or rales.  Abdominal:   General: Bowel sounds are normal.     Palpations: Abdomen is soft.     Tenderness: There is no abdominal tenderness. There is no guarding.  Musculoskeletal:        General: No swelling or tenderness.     Right lower leg: No edema.     Left lower leg: No edema.  Skin:    General: Skin is warm and dry.  Neurological:     General: No focal deficit present.     Mental Status: She is alert and oriented to person, place, and time.     Motor: No weakness.     Comments: Negative Phalen and Tinel Signs bilaterally in the upper extremity.   Psychiatric:        Mood and Affect: Mood normal.        Behavior: Behavior normal.        Thought Content: Thought content normal.     Assessment & Plan:   See Encounters Tab for problem based charting.  Patient discussed with Dr. Philipp Ovens

## 2019-10-12 NOTE — Patient Instructions (Addendum)
To Deborah Pham,  It was nice seeing you today. Today we discussed your diabetes and hand pain. Your sugars are doing well. Please continue taking the medication as indicated. For your hand pain. Continue taking Ibuprofen as needed. Avoid taking high doses of ibuprofen as they can increase your risk of stomach ulcers. I will see you in three months.  Sincerely,  [name], MD Ibuprofen tablets and capsules Qu es este medicamento? [place] es un medicamento antiinflamatorio no esteroideo (AINE). Este medicamento se puede Risk manager para Scientist, research (medical), fiebre, dolor de cabeza o migraas, artritis reumatoide, osteoartritis y perodos [employer]. Alivia tambin los sntomas de molestias y dolores menores provocados por resfros, gripe, o dolor de Investment banker, operational. Este medicamento puede ser utilizado para otros usos; si tiene alguna pregunta consulte con su proveedor de atencin mdica o con su farmacutico. MARCAS COMUNES: Advil, Advil Junior Strength, Advil Migraine, Genpril, Ibren, IBU, Ibupak, Midol, Midol Cramps and Body Aches, Motrin, Motrin IB, Motrin Junior Strength, Motrin Migraine Pain, Samson-8, Toxicology Saliva Collection [employer] debo informar a mi profesional de la salud antes de tomar este medicamento? Necesitan saber si usted presenta alguno de los siguientes problemas o situaciones: fuma ciruga de bypass coronario con injerto en las ltimas 2 semanas consume ms de 3 bebidas alcohlicas por da enfermedad cardiaca presin sangunea alta antecedentes de hemorragia estomacal enfermedad renal enfermedad heptica enfermedad pulmonar o respiratoria, como asma una reaccin alrgica o inusual al ibuprofeno, a la aspirina, a otros AINE, a otros medicamentos, alimentos, colorantes o conservantes si est embarazada o buscando quedar embarazada si est amamantando a un beb Cmo debo [employer]? Tome este medicamento por va oral con un vaso de agua. Siga las  instrucciones de la etiqueta del [place]. Si este medicamento le produce Higher education careers adviser, tmelo con alimentos. Trate de no acostarse por lo menos 10 minutos despus de tomarlo. Tome sus dosis a intervalos regulares. No tome su medicamento con una frecuencia mayor a la indicada. Su farmacutico le dar una [place] del medicamento especial con cada receta y relleno. Asegrese de leer esta informacin cada vez cuidadosamente. Hable con su pediatra para informarse acerca del uso de este medicamento en nios. Puede requerir atencin especial. Sobredosis: Pngase en contacto inmediatamente con un centro toxicolgico o una sala de urgencia si usted cree que haya tomado demasiado medicamento. ATENCIN: [employer] es solo para usted. No comparta este medicamento con nadie. Qu sucede si me olvido de una dosis? Si olvida una dosis, tmela lo antes posible. Si es casi la hora de la prxima dosis, tome slo esa dosis. No tome dosis adicionales o dobles. Qu puede interactuar con este medicamento? No tome esta medicina con ninguno de los siguientes medicamentos:  cidofovir  quetorolac  metotrexato  pemetrexed Esta medicina tambin puede interactuar con los siguientes medicamentos:  alcohol  aspirina  diurticos  litio  otros medicamentos para la inflamacin, como prednisona  warfarina Puede ser que esta lista no menciona todas las posibles interacciones. Informe a su profesional de [employer] Home	[place] de [employer] productos a base de hierbas, medicamentos de [place] o suplementos nutritivos que est tomando. Si usted fuma, consume bebidas alcohlicas o si utiliza drogas ilegales, indqueselo tambin a su profesional de [employer] Home	[place]. Algunas sustancias pueden interactuar con su medicamento. A qu debo estar atento al usar Coca[employer]Cola? Informe a su mdico o a su proveedor de atencin mdica si sus sntomas no comienzan a mejorar o si empeoran. Este medicamento puede causar reacciones graves  en la piel. Pueden suceder semanas a meses despus de comenzar a Environmental consultant. Contacte a su proveedor de atencin mdica de inmediato si nota que tiene fiebre o sntomas gripales con una erupcin. La erupcin puede ser roja o Phill Myron, y luego puede convertirse en ampollas o descamacin de la piel. O bien, es posible que observe una erupcin roja con hinchazn en la cara, los labios o los ganglios linfticos en el cuello o debajo de los brazos. Este medicamento no previene ataques al corazn o derrames cerebrales. De hecho, este medicamento puede aumentar la posibilidad de Insurance risk surveyor un ataque cardiaco o un derrame cerebral. La posibilidad puede aumentar con el uso prolongado de este medicamento y en pacientes con enfermedad cardiaca. Si est tomando aspirina para la prevencin de ataques cardiacos o derrames cerebrales, hable con su mdico o su proveedor de atencin mdica. No use otros medicamentos que contengan aspirina, ibuprofeno o naproxeno con Coca-Cola. Es ms probable que se produzcan efectos secundarios, tales como molestias estomacales, nuseas o lceras. No debe de tomar Coca-Cola con muchos medicamentos disponibles de [employer]. Este medicamento puede causar lceras y sangrado en el estmago e intestinos en cualquier momento durante el tratamiento. Pueden producirse lceras y sangrado sin sntomas de advertencia y pueden causar la muerte. Para reducir su riesgo, no fume cigarrillos ni beba alcohol mientras est [employer] Corporation. Puede experimentar somnolencia o mareos. No conduzca, no utilice maquinaria ni haga nada que Associate Professor en estado de alerta hasta que sepa cmo le afecta este medicamento. No se siente ni se ponga de pie con rapidez, especialmente si es un paciente de edad avanzada. Esto reduce el riesgo de mareos o [employer]. Este medicamento puede hacer que usted sangre con ms facilidad. Intente evitar lastimarse los dientes o las encas cuando se  cepilla los dientes o [place] hilo dental. Este medicamento puede usarse para tratar migraas. Si [place] medicamentos para la migraa durante 10 das o ms por mes, sus migraas podran empeorar. Mantenga un diario de los [employer] que tuvo dolor de [place] y el uso de medicamentos. Contacte a su proveedor de atencin mdica si sus ataques de migraa se vuelven ms frecuentes. Qu efectos secundarios puedo tener al [employer] Corporation este medicamento? Efectos secundarios que debe informar a su mdico o a Barrister's clerk de la salud tan pronto como sea posible: Chief of Staff, como erupcin cutnea, comezn/picazn o urticaria, e hinchazn de la cara, los labios o la lengua enrojecimiento, formacin de ampollas, descamacin o distensin de la piel, incluso dentro de la boca Administrator grave signos y sntomas de Teacher, music, tales como heces con sangre o de color negro y aspecto alquitranado; [place] de color rojo o marrn oscuro; escupir sangre o material marrn que tiene el aspecto de granos de caf molido; Tree surgeon rojas en la piel; sangrado o moretones inusuales en los ojos, las encas o la nariz signos y sntomas de un cogulo sanguneo, tales como cambios en la visin; Social research officer, government en el pecho; dolor de cabeza severo, repentino; dificultad para hablar; entumecimiento o debilidad repentina de la cara, el brazo o la pierna aumento de peso o hinchazn inexplicables cansancio o debilidad inusual color amarillento de los ojos o la piel Efectos secundarios que generalmente no requieren atencin mdica (infrmelos a su mdico o a Barrister's clerk de la salud si persisten o si son molestos): moretones diarrea mareos, somnolencia dolor de cabeza nuseas, vmito Puede ser que esta lista no menciona todos los posibles efectos secundarios. Comunquese a  su mdico por asesoramiento mdico Humana Inc. Usted puede informar los efectos secundarios a la FDA por telfono al 1-800-FDA-1088. Dnde debo guardar mi  medicina? Mantngala fuera del alcance de los nios. Gurdela a [employer], entre 15 y 22 grados C (22 y 68 grados F). Mantenga el envase bien cerrado. Deseche todo el medicamento que no haya utilizado, despus de la fecha de vencimiento. ATENCIN: Este folleto es un resumen. Puede ser que no cubra toda la posible informacin. Si usted tiene preguntas acerca de esta medicina, consulte con su mdico, su farmacutico o su profesional de Technical sales engineer.  2020 Elsevier/Gold Standard (2019-01-12 00:00:00)

## 2019-10-13 ENCOUNTER — Telehealth: Payer: Self-pay

## 2019-10-13 NOTE — Telephone Encounter (Signed)
To all,  Thank you for reaching out. I am fine with patient setting up with the orange card/cafa and [facility] program.  Sincerely, [name], MD

## 2019-10-13 NOTE — Telephone Encounter (Signed)
[facility] outpatient pharmacy called regarding RX for test strips which were sent in yesterday. They are asking if pt is on [facility] program. Confirmed pt does not have insurance (orange card and CAFA expired 10/02/19). [place] informed and an appt was made for financial counseling on 11/04/19, letter informing patient of appointment mailed to patient per [facility]. Dr. Gilford Rile, Are you ok with pt on [facility] program until she is set up with orange card/Cafa again? Thank you, SChaplin, RN,BSN

## 2019-10-14 NOTE — Progress Notes (Signed)
Internal Medicine Clinic Attending  Case discussed with Dr. Gilford Rile at the time of the visit.  We reviewed the resident's history and exam and pertinent patient test results.  I agree with the assessment, diagnosis, and plan of care documented in the resident's note.

## 2019-10-14 NOTE — Telephone Encounter (Signed)
[facility] outpatient pharmacy called and notified IM program for next 2 months, pt has appt with financial counselor to set up for orange card/Cafa in March. SChaplin, RN,BSN

## 2019-10-26 ENCOUNTER — Other Ambulatory Visit: Payer: Self-pay | Admitting: Internal Medicine

## 2019-10-26 DIAGNOSIS — E118 Type 2 diabetes mellitus with unspecified complications: Secondary | ICD-10-CM

## 2019-10-27 ENCOUNTER — Other Ambulatory Visit: Payer: Self-pay | Admitting: *Deleted

## 2019-10-27 DIAGNOSIS — E118 Type 2 diabetes mellitus with unspecified complications: Secondary | ICD-10-CM

## 2019-10-27 MED ORDER — DAPAGLIFLOZIN PROPANEDIOL 5 MG PO TABS: 5.0000 mg | ORAL_TABLET | Freq: Every day | ORAL | 0 refills | Status: DC

## 2019-10-27 NOTE — Telephone Encounter (Signed)
Call from [place], [place] - received rx for fariga and pt is uninsured. Need to re-send rx with "IM Program"  If she's qualifies. Thanks

## 2019-11-09 ENCOUNTER — Ambulatory Visit (INDEPENDENT_AMBULATORY_CARE_PROVIDER_SITE_OTHER): Payer: Self-pay | Admitting: Internal Medicine

## 2019-11-09 ENCOUNTER — Telehealth: Payer: Self-pay

## 2019-11-09 ENCOUNTER — Ambulatory Visit: Payer: Self-pay

## 2019-11-09 ENCOUNTER — Telehealth: Payer: Self-pay | Admitting: *Deleted

## 2019-11-09 ENCOUNTER — Other Ambulatory Visit: Payer: Self-pay

## 2019-11-09 ENCOUNTER — Encounter: Payer: Self-pay | Admitting: Internal Medicine

## 2019-11-09 VITALS — BP 138/60 | HR 72 | Temp 97.7°F | Ht 59.0 in | Wt 221.0 lb

## 2019-11-09 DIAGNOSIS — R600 Localized edema: Secondary | ICD-10-CM

## 2019-11-09 LAB — BRAIN NATRIURETIC PEPTIDE: B Natriuretic Peptide: 280.2 pg/mL — ABNORMAL HIGH (ref 0.0–100.0)

## 2019-11-09 NOTE — Assessment & Plan Note (Addendum)
Hx:  Patient states that she noticed her swelling on 11/07/19 when she woke up that morning. Patient states that the pain is "more like fatigue," constant in nature, and rates a 4/10 with no radiation. Patient states that there are no aggravating factors, and that Aleve alleviates the symptoms. Patient endorses that her liver doctor stated that she is retaining fluid on November 2020. She denies changes in diet and is active on a daily basis with no inactivity or travelling long distances. Patient denies orthopnea, SHOB, dyspnea, nausea or vomiting.  Assessment:  Patient with a 3 day history of lower extremity 1+ pitting edema with no changes in diet or activity, with a PMH of diabetes and liver cirrhosis for which she follows with GI. Etiologies could be due to progression of her diabetes to nephropathy, liver cirrhosis, or possibly new cardiac etiology.   Additionally, patient met with finance today to see if she qualifies for the orange card. Will hold on imaging now based on outcome of orange card qualifications.   Plan:  - BNP ordered - Urinalysis ordered - BMP ordered - Consider ordering micro albumin/creatinine ratio at next visit.   - Patient instructed to monitor condition for worsening symptoms.  - Will call patient with results for next steps upon receiving labs.

## 2019-11-09 NOTE — Telephone Encounter (Signed)
Thank you, agree she should be evaluated

## 2019-11-09 NOTE — Telephone Encounter (Signed)
error 

## 2019-11-09 NOTE — Patient Instructions (Addendum)
To Deborah Pham,  Today you were seen for your bilateral foot swelling. We discussed getting labs to see if your swelling is due to your kidneys, liver, or heart. Please continue watching your swelling to see if it is getting better or worse. I will call you with your results and next steps in management.  Please come back for reevaluation in a month. Have a good day!  Sincerely,  [name], MD

## 2019-11-09 NOTE — Telephone Encounter (Signed)
Health Coaching 3  interpreter- Rudene Anda, [facility]   Goals- Patient has been making changes with diet and exercise. Patient stated that her husband has been put on a diet for diabetes and they have been making changes with diet. Patient states that she has lost 15 pounds since making these changes. Encouraged patient to keep up this great work. Patient stated that she has been having some negative side effects from her new diabetes medication. Encouraged patient to follow-up with her doctor about how she has been feeling recently.    New goal- Continue with the lifestyle changes she has been working on with diet and exercise.  Barrier to reaching goal- None   Strategies to overcome- NA   Navigation:  Patient is aware of  a follow up session. Patient is scheduled for follow-up appointment on Monday, April 19 @ 3:00 pm.   Time- 12 minutes

## 2019-11-09 NOTE — Progress Notes (Signed)
   CC: Swollen Feet   HPI:  [name] is a 54 y.o. Comes to the office with complaints of swollen feet. To see the treatment of her chronic and acute conditions, please see the separate assessment and plan under the encounters tab.   Past Medical History:  Diagnosis Date  . Diabetes mellitus without complication ([place])   . Fatty liver   . NASH (nonalcoholic steatohepatitis)   . Obesity    Review of Systems:   Review of Systems  Constitutional: Negative for chills, fever, malaise/fatigue and weight loss.  Respiratory: Negative for cough, shortness of breath and wheezing.   Cardiovascular: Positive for leg swelling. Negative for chest pain, orthopnea and claudication.       Patient with complaints of bilateral foot and leg swelling.   Gastrointestinal: Negative for abdominal pain, blood in stool, constipation, diarrhea, nausea and vomiting.  Genitourinary: Negative for flank pain, frequency, hematuria and urgency.  Musculoskeletal: Negative for myalgias.  Skin: Negative for itching and rash.  Neurological: Negative for tingling, tremors, sensory change, focal weakness, weakness and headaches.     Physical Exam:  Vitals:   11/09/19 1455  Weight: 221 lb (100.2 kg)  Height: 4' 11"  (1.499 m)   Physical Exam Constitutional:      General: She is not in acute distress.    Appearance: She is obese. She is not ill-appearing or diaphoretic.  HENT:     Head: Normocephalic and atraumatic.  Eyes:     General:        Right eye: No discharge.        Left eye: No discharge.     Conjunctiva/sclera: Conjunctivae normal.  Cardiovascular:     Rate and Rhythm: Normal rate and regular rhythm.     Pulses: Normal pulses.     Heart sounds: Normal heart sounds. No murmur. No friction rub. No gallop.   Pulmonary:     Effort: Pulmonary effort is normal.     Breath sounds: Normal breath sounds. No wheezing, rhonchi or rales.  Abdominal:     General: Abdomen is flat. Bowel  sounds are normal.     Tenderness: There is no abdominal tenderness. There is no guarding.  Musculoskeletal:        General: No tenderness or deformity.     Right lower leg: Edema present.     Left lower leg: Edema present.     Comments: 1+ lower extremity pitting edema bilaterally, both appendages are equal in circumference.   Skin:    General: Skin is warm and dry.     Findings: No bruising.  Neurological:     Mental Status: She is alert and oriented to person, place, and time.  Psychiatric:        Mood and Affect: Mood normal.        Behavior: Behavior normal.     Assessment & Plan:   See Encounters Tab for problem based charting.  Patient discussed with Dr. Rebeca Alert

## 2019-11-09 NOTE — Telephone Encounter (Signed)
Left message for patient about completing Health Coaching 3 for the [place] [facility] program. Left name and number for patient to call back.

## 2019-11-09 NOTE — Telephone Encounter (Signed)
Pt here to see financial counselor, c/o diab meds making feet swell, she stopped meds 2 days ago. Feet still swollen and painful. [place] 3/15 at 1445

## 2019-11-10 LAB — BMP8+ANION GAP
Anion Gap: 13 mmol/L (ref 10.0–18.0)
BUN/Creatinine Ratio: 18 (ref 9–23)
BUN: 9 mg/dL (ref 6–24)
CO2: 19 mmol/L — ABNORMAL LOW (ref 20–29)
Calcium: 8.8 mg/dL (ref 8.7–10.2)
Chloride: 108 mmol/L — ABNORMAL HIGH (ref 96–106)
Creatinine, Ser: 0.51 mg/dL — ABNORMAL LOW (ref 0.57–1.00)
GFR calc Af Amer: 126 mL/min/{1.73_m2} (ref >59)
GFR calc non Af Amer: 109 mL/min/{1.73_m2} (ref >59)
Glucose: 114 mg/dL — ABNORMAL HIGH (ref 65–99)
Potassium: 3.5 mmol/L (ref 3.5–5.2)
Sodium: 140 mmol/L (ref 134–144)

## 2019-11-10 NOTE — Progress Notes (Signed)
Internal Medicine Clinic Attending  Case discussed with Dr. Gilford Rile at the time of the visit.  We reviewed the resident's history and exam and pertinent patient test results.  I agree with the assessment, diagnosis, and plan of care documented in the resident's note.  New bilateral leg swelling, unclear etiology, BNP is elevated, raising concern for CHF. Will need further evaluation with TTE.  Lenice Pressman, M.D., Ph.D.

## 2019-11-12 LAB — URINALYSIS, COMPLETE

## 2019-11-12 NOTE — Addendum Note (Signed)
Addended by: [name] C on: 11/12/2019 12:48 AM   Modules accepted: Level of Service

## 2019-11-13 NOTE — Addendum Note (Signed)
Addended by: Truddie Crumble on: 11/13/2019 08:52 AM   Modules accepted: Orders

## 2019-11-24 ENCOUNTER — Encounter: Payer: Self-pay | Admitting: Internal Medicine

## 2019-11-24 ENCOUNTER — Telehealth: Payer: Self-pay | Admitting: Gastroenterology

## 2019-11-24 NOTE — Telephone Encounter (Signed)
Left a message with the interpreting service Clifton James [phone]) telling the patient the providers are encouraging patient to get their COVID vaccines if they have the chance to.

## 2019-11-24 NOTE — Telephone Encounter (Signed)
Patient would like to know if it would be ok for her to get the covid vaccine with her medical hx

## 2019-12-14 ENCOUNTER — Ambulatory Visit: Payer: Self-pay

## 2019-12-21 ENCOUNTER — Ambulatory Visit: Payer: Self-pay | Admitting: Gastroenterology

## 2020-01-11 ENCOUNTER — Ambulatory Visit (INDEPENDENT_AMBULATORY_CARE_PROVIDER_SITE_OTHER): Payer: Self-pay | Admitting: Internal Medicine

## 2020-01-11 ENCOUNTER — Encounter: Payer: Self-pay | Admitting: Internal Medicine

## 2020-01-11 ENCOUNTER — Other Ambulatory Visit: Payer: Self-pay

## 2020-01-11 VITALS — BP 129/60 | HR 70 | Temp 97.8°F | Ht 59.0 in | Wt 219.3 lb

## 2020-01-11 DIAGNOSIS — E118 Type 2 diabetes mellitus with unspecified complications: Secondary | ICD-10-CM

## 2020-01-11 DIAGNOSIS — Z7984 Long term (current) use of oral hypoglycemic drugs: Secondary | ICD-10-CM

## 2020-01-11 DIAGNOSIS — R600 Localized edema: Secondary | ICD-10-CM

## 2020-01-11 DIAGNOSIS — M25551 Pain in right hip: Secondary | ICD-10-CM

## 2020-01-11 LAB — GLUCOSE, CAPILLARY: Glucose-Capillary: 148 mg/dL — ABNORMAL HIGH (ref 70–99)

## 2020-01-11 LAB — POCT GLYCOSYLATED HEMOGLOBIN (HGB A1C): Hemoglobin A1C: 6.6 % — AB (ref 4.0–5.6)

## 2020-01-11 MED ORDER — MELOXICAM 7.5 MG PO TABS: 7.5000 mg | ORAL_TABLET | Freq: Every day | ORAL | 0 refills | Status: AC

## 2020-01-11 NOTE — Patient Instructions (Addendum)
To Ms. Harvest Dark,   Today we discussed your leg pain. I will prescribe you a medication for your leg pain. Please take the medication daily for your leg pain. Additionally, we will get an image of your heart, to make sure it is functioning. I will see you in three months!  Sincerely,  [name], MD

## 2020-01-14 ENCOUNTER — Encounter: Payer: Self-pay | Admitting: Internal Medicine

## 2020-01-14 DIAGNOSIS — M25551 Pain in right hip: Secondary | ICD-10-CM | POA: Insufficient documentation

## 2020-01-14 MED ORDER — DAPAGLIFLOZIN PROPANEDIOL 5 MG PO TABS: 5.0000 mg | ORAL_TABLET | Freq: Every day | ORAL | 0 refills | Status: DC

## 2020-01-14 MED ORDER — CONTOUR NEXT TEST VI STRP: ORAL_STRIP | 12 refills | Status: DC

## 2020-01-14 NOTE — Assessment & Plan Note (Signed)
Patient presenting to the clinic with an A1c of 6.6. She is currently on Farxiga 80m. She has not experienced any side effects of the medications. She is compliant with her medications, and is in need of refills at today's appointment.   Plan: Follow up in 3 months.  Refill on Farxiga Refill on glucose test strips.

## 2020-01-14 NOTE — Progress Notes (Signed)
   CC: Diabetes   HPI:  [name] is a 54 y.o. female with a PMH noted below, who presents to the clinic for follow up on her diabetes. To see the management of her acute and chronic conditions see the separate assessment and plan.   Past Medical History:  Diagnosis Date  . Diabetes mellitus without complication ([place])   . Fatty liver   . NASH (nonalcoholic steatohepatitis)   . Obesity    Review of Systems:   Review of Systems  Constitutional: Negative for chills, malaise/fatigue and weight loss.  Cardiovascular: Negative for chest pain and palpitations.  Gastrointestinal: Negative for abdominal pain, diarrhea, nausea and vomiting.  Musculoskeletal:       RLE pain  Neurological: Negative for tingling, tremors and headaches.     Physical Exam:  Vitals:   01/11/20 1423  BP: 129/60  Pulse: 70  Temp: 97.8 F (36.6 C)  TempSrc: Oral  SpO2: 98%  Weight: 219 lb 4.8 oz (99.5 kg)  Height: 4' 11"  (1.499 m)   Physical Exam Vitals reviewed.  Constitutional:      Appearance: Normal appearance. She is not ill-appearing.  Cardiovascular:     Rate and Rhythm: Normal rate and regular rhythm.  Pulmonary:     Effort: Pulmonary effort is normal.     Breath sounds: Normal breath sounds. No wheezing, rhonchi or rales.  Abdominal:     General: Abdomen is flat. Bowel sounds are normal.     Tenderness: There is no abdominal tenderness. There is no guarding.  Musculoskeletal:        General: Normal range of motion.     Comments: Pain elicited on the lateral aspect of the right thigh. No erythema noted. No pain to palpation of the R greater trochanter. Hips bilaterally with full ROM.   Pitting edema bilaterally in the lower extremities   Neurological:     Mental Status: She is alert.     Assessment & Plan:   See Encounters Tab for problem based charting.  Patient discussed with Dr. Rebeca Alert

## 2020-01-14 NOTE — Assessment & Plan Note (Signed)
HPI:  Patient presents with right lateral hip pain. Patient states that her pain has been present for approximately 20 days. Patient states that she does not know an inciting cause of her pain from 20 days ago. She states that her pain is aching in character with no radiation. Her pain is 9/10 and is alleviated with Advil to a 0-4/10. She endorses that the pain is aggravated with prolonged standing. She denies loss of sensation or weakness of the affected limb.   Assessment Patient presents with R lateral hip pain. ROM on physical exam is full with no pain on flexion or extension. No pain to palpation along the greater trochanter. There is no erythema on physical examination. Pain worsening with prolonged standing with alleviation with Advil, likely from overuse as patient has prolonged standing at work. Will start a trial of mobic.  Plan:  Mobic 7.5

## 2020-01-14 NOTE — Assessment & Plan Note (Addendum)
Patient presents with pitting edema on physical examination. Will order echo today.  Plan:  Echo

## 2020-01-15 NOTE — Progress Notes (Signed)
Internal Medicine Clinic Attending  Case discussed with Dr. Gilford Rile at the time of the visit.  We reviewed the resident's history and exam and pertinent patient test results.  I agree with the assessment, diagnosis, and plan of care documented in the resident's note.  Lenice Pressman, M.D., Ph.D.

## 2020-02-19 ENCOUNTER — Ambulatory Visit: Payer: Self-pay | Admitting: Gastroenterology

## 2020-03-29 ENCOUNTER — Ambulatory Visit (INDEPENDENT_AMBULATORY_CARE_PROVIDER_SITE_OTHER): Payer: Self-pay | Admitting: Internal Medicine

## 2020-03-29 ENCOUNTER — Encounter: Payer: Self-pay | Admitting: Internal Medicine

## 2020-03-29 ENCOUNTER — Other Ambulatory Visit: Payer: Self-pay

## 2020-03-29 VITALS — BP 133/69 | HR 69 | Temp 97.5°F | Ht 59.0 in | Wt 219.4 lb

## 2020-03-29 DIAGNOSIS — R600 Localized edema: Secondary | ICD-10-CM

## 2020-03-29 DIAGNOSIS — E118 Type 2 diabetes mellitus with unspecified complications: Secondary | ICD-10-CM

## 2020-03-29 LAB — POCT GLYCOSYLATED HEMOGLOBIN (HGB A1C): Hemoglobin A1C: 6.6 % — AB (ref 4.0–5.6)

## 2020-03-29 LAB — GLUCOSE, CAPILLARY: Glucose-Capillary: 240 mg/dL — ABNORMAL HIGH (ref 70–99)

## 2020-03-29 MED ORDER — FUROSEMIDE 20 MG PO TABS: 20.0000 mg | ORAL_TABLET | Freq: Every day | ORAL | 5 refills | Status: DC

## 2020-03-29 MED ORDER — SPIRONOLACTONE 50 MG PO TABS: 50.0000 mg | ORAL_TABLET | Freq: Every day | ORAL | 5 refills | Status: DC

## 2020-03-29 NOTE — Patient Instructions (Addendum)
Ms. Winfred Redel,  It was a pleasure to see you today. Thank you for coming in.   Today we discussed your diabetes. This seems to be doing well. Please continue taking your Wilder Glade as prescribed.    We also discussed your lower extremity swelling. I have restarted the aldactone and lasix to help with your swelling. I am checking some labs today.    Please return to clinic in 2-3 weeks or sooner if needed.   Thank you again for coming in.   Lonia Skinner M.D.   [place] Mc. Cecilia Deatra Ina,  [place] un placer verte hoy. Gracias por venir.  Hoy hablamos de tu diabetes. Esto parece ir bien. Contine tomando [place] segn lo prescrito.  Tambin hablamos sobre la hinchazn de sus extremidades inferiores. He reiniciado la [place] y lasix para ayudar con la hinchazn. Estoy revisando algunos laboratorios hoy.  Regrese a Copy en 2-3 semanas o antes si es necesario.  Gracias de nuevo por venir.  Asencion Noble.D.

## 2020-03-29 NOTE — Progress Notes (Signed)
   CC: Diabetes, bilateral lower extremities  HPI:  [name] is a 54 y.o. history of diabetes, cirrhosis secondary to NAFLD, and fatty liver presenting for follow-up of her diabetes and bilateral lower extremity swelling.  Past Medical History:  Diagnosis Date  . Diabetes mellitus without complication ([place])   . Fatty liver   . NASH (nonalcoholic steatohepatitis)   . Obesity    Review of Systems:   Constitutional: Negative for chills and fever.  Respiratory: Negative for shortness of breath.   Cardiovascular: Negative for chest pain. Positive for BL LE swelling.  Gastrointestinal: Negative for abdominal pain, nausea and vomiting.  Neurological: Negative for dizziness and headaches.   Physical Exam:  Vitals:   03/29/20 1446  BP: 133/69  Pulse: 69  Temp: (!) 97.5 F (36.4 C)  TempSrc: Oral  SpO2: 99%  Weight: 219 lb 6.4 oz (99.5 kg)  Height: 4' 11"  (1.499 m)   Physical Exam Constitutional:      Appearance: Normal appearance.  HENT:     Head: Normocephalic and atraumatic.     Mouth/Throat:     Mouth: Mucous membranes are moist.     Pharynx: Oropharynx is clear.  Cardiovascular:     Rate and Rhythm: Normal rate and regular rhythm.     Pulses: Normal pulses.     Heart sounds: Normal heart sounds.  Pulmonary:     Effort: Pulmonary effort is normal.     Breath sounds: Normal breath sounds.  Abdominal:     General: Abdomen is flat.     Palpations: Abdomen is soft.  Musculoskeletal:        General: Swelling (1+ BL LE swelling up to mid shins) present. Normal range of motion.     Cervical back: Normal range of motion and neck supple.  Skin:    General: Skin is warm and dry.     Capillary Refill: Capillary refill takes less than 2 seconds.  Neurological:     General: No focal deficit present.     Mental Status: She is alert and oriented to person, place, and time.  Psychiatric:        Mood and Affect: Mood normal.        Behavior: Behavior normal.      Assessment & Plan:   See Encounters Tab for problem based charting.  Patient discussed with Dr. Philipp Ovens

## 2020-03-30 NOTE — Assessment & Plan Note (Signed)
Patient is currently on Farxiga 5 mg daily.  Reports having some increased urination and feeling thirsty.  Urinates every 1-2 hours.  Her glucometer readings showed average of 132, within target 96.8% of the time, above target 3.2% of the time.  Her A1c today is 6.6.  Appears to be well controlled.   -Continue Farxiga 5 mg daily.

## 2020-03-30 NOTE — Progress Notes (Signed)
Internal Medicine Clinic Attending  Case discussed with Dr. Sherry Ruffing  At the time of the visit.  We reviewed the residents history and exam and pertinent patient test results.  I agree with the assessment, diagnosis, and plan of care documented in the residents note.

## 2020-03-30 NOTE — Assessment & Plan Note (Signed)
Patient has a history of cirrhosis secondary to NAFLD, follows with GI and is currently on Coreg 3.125 mg twice daily.  She last saw GI in April of this year.  For the past 6 months she states she has been having worsening bilateral lower extremity swelling, occurs mostly during the day when she is standing on her feet for long periods of time, improves with lying flat.  She denies any chest pain, shortness of breath, orthopnea, nausea, vomiting, abdominal pain, or other symptoms.  She had previously been on low-dose of spironolactone and Lasix, this was discontinued because she was no longer having peripheral edema.  On exam she has 1+ bilateral pitting edema up to the mid shins.  This may be secondary to her cirrhosis.  We will resume low-dose of diuretics at this time.  She had a an echocardiogram previously ordered for this symptom, will follow this up.  -Start Lasix 20 mg daily, spironolactone 50 mg daily -Check BMP -Follow-up echocardiogram -RTC in 2 to 3 weeks to repeat labs

## 2020-04-29 ENCOUNTER — Other Ambulatory Visit: Payer: Self-pay | Admitting: Internal Medicine

## 2020-04-29 DIAGNOSIS — K7469 Other cirrhosis of liver: Secondary | ICD-10-CM

## 2020-05-16 ENCOUNTER — Telehealth: Payer: Self-pay | Admitting: Gastroenterology

## 2020-05-16 DIAGNOSIS — K7469 Other cirrhosis of liver: Secondary | ICD-10-CM

## 2020-05-16 MED ORDER — CARVEDILOL 3.125 MG PO TABS: 3.1250 mg | ORAL_TABLET | Freq: Two times a day (BID) | ORAL | 0 refills | Status: DC

## 2020-05-16 NOTE — Telephone Encounter (Signed)
Refilled for 90 days, pt has follow up scheduled for 07-19-2020

## 2020-05-16 NOTE — Telephone Encounter (Signed)
Patient calling for refill on Carvedilol

## 2020-05-17 ENCOUNTER — Other Ambulatory Visit: Payer: Self-pay | Admitting: Obstetrics and Gynecology

## 2020-05-17 DIAGNOSIS — Z1231 Encounter for screening mammogram for malignant neoplasm of breast: Secondary | ICD-10-CM

## 2020-05-24 ENCOUNTER — Other Ambulatory Visit: Payer: Self-pay | Admitting: Student

## 2020-05-24 ENCOUNTER — Other Ambulatory Visit: Payer: Self-pay | Admitting: Internal Medicine

## 2020-05-24 DIAGNOSIS — E118 Type 2 diabetes mellitus with unspecified complications: Secondary | ICD-10-CM

## 2020-05-24 MED ORDER — DAPAGLIFLOZIN PROPANEDIOL 5 MG PO TABS: ORAL_TABLET | ORAL | 0 refills | Status: DC

## 2020-06-10 ENCOUNTER — Encounter: Payer: Self-pay | Admitting: Internal Medicine

## 2020-06-14 ENCOUNTER — Ambulatory Visit: Payer: Self-pay | Admitting: *Deleted

## 2020-06-14 ENCOUNTER — Other Ambulatory Visit: Payer: Self-pay

## 2020-06-14 ENCOUNTER — Ambulatory Visit
Admission: RE | Admit: 2020-06-14 | Discharge: 2020-06-14 | Disposition: A | Discharge status: Home / Self Care | Payer: No Typology Code available for payment source | Source: Ambulatory Visit | Attending: Obstetrics and Gynecology | Admitting: Obstetrics and Gynecology

## 2020-06-14 VITALS — BP 112/86 | Temp 97.5°F | Wt 211.1 lb

## 2020-06-14 DIAGNOSIS — Z1239 Encounter for other screening for malignant neoplasm of breast: Secondary | ICD-10-CM

## 2020-06-14 DIAGNOSIS — Z1211 Encounter for screening for malignant neoplasm of colon: Secondary | ICD-10-CM

## 2020-06-14 DIAGNOSIS — Z1231 Encounter for screening mammogram for malignant neoplasm of breast: Secondary | ICD-10-CM

## 2020-06-14 NOTE — Progress Notes (Signed)
[name] is a 54 y.o. female who presents to [facility] clinic today with no complaints.    Pap Smear: Pap smear not completed today. Last Pap smear was 5/17/2018at [facility] and normal with negative HPV. Per patient has no history of an abnormal Pap smear. Last Pap smear result is in EPIC.   Physical exam: Breasts Breasts symmetrical. No skin abnormalities bilateral breasts. No nipple retraction bilateral breasts. No nipple discharge bilateral breasts. No lymphadenopathy. No lumps palpated bilateral breasts. No complaints of pain or tenderness on exam.       Pelvic/Bimanual Pap is not indicated today per BCCCP guidelines.    Smoking History: Patient has never smoked.   Patient Navigation: Patient education provided. Access to services provided for patient through [place] program. Spanish interpreter Rudene Anda  From [facility] provided.   Colorectal Cancer Screening: Per patient has never had a colonoscopy completed.FIT Test completed 01/14/2017 and negative. FIT Test given to patient to complete.No complaints today.    Breast and Cervical Cancer Risk Assessment: Patient does not have family history of breast cancer, known genetic mutations, or radiation treatment to the chest before age 33. Patient does not have history of cervical dysplasia, immunocompromised, or DES exposure in-utero.  Risk Assessment    Risk Scores      06/14/2020 11/11/2018   Last edited by: Royston Bake, CMA Rolena Infante H, LPN   5-year risk: 0.8 % 0.7 %   Lifetime risk: 5.6 % 5.9 %          A: BCCCP exam without pap smear No complaints.  P: Referred patient to the [place] for a screening mammogram on the mobile unit. Appointment scheduled Tuesday, June 14, 2020 at 0930.  Loletta Parish, RN 06/14/2020 9:33 AM

## 2020-06-14 NOTE — Patient Instructions (Signed)
Explained breast self awareness with Chilton Si. Patient did not need a Pap smear today due to last Pap smear and HPV typing was 01/10/2017. Let her know BCCCP will cover Pap smears and HPV typing every 5 years unless has a history of abnormal Pap smears. Referred patient to the [place] for a screening mammogram on the mobile unit. Appointment scheduled Tuesday, June 14, 2020 at 0930. Patient escorted to the mobile unit following BCCCP appointment for her screening mammogram. Let patient know the [facility] will follow up with her within the next couple weeks with results of her mammogram by letter or phone. [place] verbalized understanding.  Mathilde Mcwherter, Arvil Chaco, RN 9:33 AM

## 2020-06-24 ENCOUNTER — Other Ambulatory Visit: Payer: Self-pay

## 2020-06-24 ENCOUNTER — Encounter: Payer: Self-pay | Admitting: Internal Medicine

## 2020-06-24 ENCOUNTER — Ambulatory Visit (INDEPENDENT_AMBULATORY_CARE_PROVIDER_SITE_OTHER): Payer: Self-pay | Admitting: Internal Medicine

## 2020-06-24 VITALS — BP 114/49 | HR 72 | Temp 98.0°F | Ht 59.0 in | Wt 214.4 lb

## 2020-06-24 DIAGNOSIS — K7469 Other cirrhosis of liver: Secondary | ICD-10-CM

## 2020-06-24 DIAGNOSIS — E118 Type 2 diabetes mellitus with unspecified complications: Secondary | ICD-10-CM

## 2020-06-24 DIAGNOSIS — Z23 Encounter for immunization: Secondary | ICD-10-CM

## 2020-06-24 DIAGNOSIS — E119 Type 2 diabetes mellitus without complications: Secondary | ICD-10-CM

## 2020-06-24 LAB — GLUCOSE, CAPILLARY: Glucose-Capillary: 406 mg/dL — ABNORMAL HIGH (ref 70–99)

## 2020-06-24 LAB — POCT GLYCOSYLATED HEMOGLOBIN (HGB A1C): Hemoglobin A1C: 8.6 % — AB (ref 4.0–5.6)

## 2020-06-24 MED ORDER — GLIPIZIDE 5 MG PO TABS: 2.5000 mg | ORAL_TABLET | Freq: Every day | ORAL | 2 refills | Status: DC

## 2020-06-24 NOTE — Progress Notes (Signed)
CC: DM  HPI:  [name] is a 54 y.o. female with a past medical history stated below and presents today for DM. Please see problem based assessment and plan for additional details.  Past Medical History:  Diagnosis Date  . Diabetes mellitus without complication ([place])   . Fatty liver   . NASH (nonalcoholic steatohepatitis)   . Obesity     Current Outpatient Medications on File Prior to Visit  Medication Sig Dispense Refill  . carvedilol (COREG) 3.125 MG tablet Take 1 tablet (3.125 mg total) by mouth 2 (two) times daily with a meal. 180 tablet 0  . dapagliflozin propanediol (FARXIGA) 5 MG TABS tablet TAKE 1 TABLET BY MOUTH DAILY BEFORE BREAKFAST. 30 tablet 0  . furosemide (LASIX) 20 MG tablet Take 1 tablet (20 mg total) by mouth daily. 30 tablet 5  . glucose blood (CONTOUR NEXT TEST) test strip Use as instructed 100 each 12  . spironolactone (ALDACTONE) 50 MG tablet Take 1 tablet (50 mg total) by mouth daily. 30 tablet 5   No current facility-administered medications on file prior to visit.    Family History  Problem Relation Age of Onset  . Liver disease Father   . Alcoholism Father   . Diabetes Mother   . Colon cancer Neg Hx   . Breast cancer Neg Hx   . Stomach cancer Neg Hx   . Throat cancer Neg Hx   . Pancreatic cancer Neg Hx     Social History   Socioeconomic History  . Marital status: Married    Spouse name: Not on file  . Number of children: 2  . Years of education: Not on file  . Highest education level: 12th grade  Occupational History  . Occupation: housewife  Tobacco Use  . Smoking status: Never Smoker  . Smokeless tobacco: Never Used  Vaping Use  . Vaping Use: Never used  Substance and Sexual Activity  . Alcohol use: No  . Drug use: No  . Sexual activity: Yes    Partners: Male    Birth control/protection: Post-menopausal  Other Topics Concern  . Not on file  Social History Narrative  . Not on file   Social Determinants of  Health   Financial Resource Strain:   . Difficulty of Paying Living Expenses: Not on file  Food Insecurity:   . Worried About Charity fundraiser in the Last Year: Not on file  . Ran Out of Food in the Last Year: Not on file  Transportation Needs: No Transportation Needs  . Lack of Transportation (Medical): No  . Lack of Transportation (Non-Medical): No  Physical Activity:   . Days of Exercise per Week: Not on file  . Minutes of Exercise per Session: Not on file  Stress:   . Feeling of Stress : Not on file  Social Connections:   . Frequency of Communication with Friends and Family: Not on file  . Frequency of Social Gatherings with Friends and Family: Not on file  . Attends Religious Services: Not on file  . Active Member of Clubs or Organizations: Not on file  . Attends Archivist Meetings: Not on file  . Marital Status: Not on file  Intimate Partner Violence:   . Fear of Current or Ex-Partner: Not on file  . Emotionally Abused: Not on file  . Physically Abused: Not on file  . Sexually Abused: Not on file    Review of Systems: ROS negative except for what is  noted on the assessment and plan.  Vitals:   06/24/20 1449  BP: (!) 114/49  Pulse: 72  Temp: 98 F (36.7 C)  TempSrc: Oral  SpO2: 100%  Weight: 214 lb 6.4 oz (97.3 kg)  Height: 4' 11"  (1.499 m)     Physical Exam: Physical Exam Constitutional:      Appearance: Normal appearance.  HENT:     Head: Normocephalic and atraumatic.  Eyes:     General: No scleral icterus.    Extraocular Movements: Extraocular movements intact.  Cardiovascular:     Rate and Rhythm: Normal rate.     Pulses: Normal pulses.     Heart sounds: Normal heart sounds.  Pulmonary:     Effort: Pulmonary effort is normal.     Breath sounds: Normal breath sounds.  Abdominal:     General: Bowel sounds are normal.     Palpations: Abdomen is soft.     Tenderness: There is no abdominal tenderness.     Comments: No sign of ascites      Musculoskeletal:        General: Normal range of motion.     Cervical back: Normal range of motion.     Right lower leg: No edema.     Left lower leg: No edema.  Skin:    General: Skin is warm and dry.     Coloration: Skin is not jaundiced.  Neurological:     Mental Status: She is alert and oriented to person, place, and time. Mental status is at baseline.  Psychiatric:        Mood and Affect: Mood normal.        Thought Content: Thought content normal.      Assessment & Plan:   See Encounters Tab for problem based charting.  Patient discussed with Dr. Karie Schwalbe, D.O. [facility], PGY-2 Pager: [phone], Phone: [phone] Date 06/26/2020 Time 11:19 AM

## 2020-06-24 NOTE — Patient Instructions (Signed)
Thank you, [name] for allowing [place] to provide your care today. Today we discussed Diabetes, cirrhosis.    I have ordered the following labs for you:   Lab Orders     BMP8+Anion Gap     Microalbumin / Creatinine Urine Ratio     Glucose, capillary     POC Hbg A1C   Tests ordered today:  none  Referrals ordered today:   Referral Orders  No referral(s) requested today     I have ordered the following medication/changed the following medications:   Stop the following medications: There are no discontinued medications.   Start the following medications: Meds ordered this encounter  Medications  . glipiZIDE (GLUCOTROL) 5 MG tablet    Sig: Take 0.5 tablets (2.5 mg total) by mouth daily before breakfast.    Dispense:  15 tablet    Refill:  2    IM program     Follow up: 3 months    Remember:   Should you have any questions or concerns please call the internal medicine clinic at [phone].     Marianna Payment, D.O. [place]

## 2020-06-25 LAB — BMP8+ANION GAP
Anion Gap: 14 mmol/L (ref 10.0–18.0)
BUN/Creatinine Ratio: 14 (ref 9–23)
BUN: 12 mg/dL (ref 6–24)
CO2: 21 mmol/L (ref 20–29)
Calcium: 9.3 mg/dL (ref 8.7–10.2)
Chloride: 98 mmol/L (ref 96–106)
Creatinine, Ser: 0.87 mg/dL (ref 0.57–1.00)
GFR calc Af Amer: 87 mL/min/{1.73_m2} (ref >59)
GFR calc non Af Amer: 75 mL/min/{1.73_m2} (ref >59)
Glucose: 402 mg/dL — ABNORMAL HIGH (ref 65–99)
Potassium: 4.2 mmol/L (ref 3.5–5.2)
Sodium: 133 mmol/L — ABNORMAL LOW (ref 134–144)

## 2020-06-25 LAB — MICROALBUMIN / CREATININE URINE RATIO
Creatinine, Urine: 20.9 mg/dL
Microalb/Creat Ratio: <14 mg/g creat (ref 0–29)
Microalbumin, Urine: <3 ug/mL

## 2020-06-26 ENCOUNTER — Encounter: Payer: Self-pay | Admitting: Internal Medicine

## 2020-06-26 NOTE — Assessment & Plan Note (Signed)
Patient has a history of MAFLD on diuretics and nonselective beta blockers, carvedilol. She denies hypervolemia and is tolerating her medications well. She has had a previous EGD through her GI doctor, Dr. Sharla Kidney.

## 2020-06-26 NOTE — Assessment & Plan Note (Signed)
Patient presents for reevaluation for DM. Past A1c was 6.6 and today it is 8.6 on dapagloflozin 5 mg. She express GI upset with this medication. She states that her GI doctor told her not to take metformin. She would likely benefit from another diabetic medications as her blood sugars are consistently in the mid 200's.  Plan: I will start glipizide 5 mg today.

## 2020-06-27 NOTE — Progress Notes (Signed)
Internal Medicine Clinic Attending  Case discussed with Dr. Coe  At the time of the visit.  We reviewed the resident's history and exam and pertinent patient test results.  I agree with the assessment, diagnosis, and plan of care documented in the resident's note.  

## 2020-06-29 NOTE — Addendum Note (Signed)
Addended by: Truddie Crumble on: 06/29/2020 09:47 AM   Modules accepted: Orders

## 2020-07-04 ENCOUNTER — Telehealth: Payer: Self-pay | Admitting: *Deleted

## 2020-07-04 NOTE — Telephone Encounter (Signed)
Patient's husband called in. States patient stopped farxiga and started glipizide 2.5 mg daily at last OV on 06/24/2020. AM fasting blood sugars since then have been 260-380. It appears patient may have stopped farxiga on her own 2/2 GI upset. Please advise. [name], BSN, RN-BC

## 2020-07-04 NOTE — Telephone Encounter (Signed)
Would recommend patient continue to take both farxiga and glipizide. If she is not able to tolerate due to side effects would recommend we schedule her to be seen in clinic this week. Thanks!

## 2020-07-04 NOTE — Telephone Encounter (Signed)
Returned call to patient's husband to relay info below. No answer. Left message on VM requesting return call. [name], BSN, RN-BC

## 2020-07-05 ENCOUNTER — Other Ambulatory Visit: Payer: Self-pay | Admitting: *Deleted

## 2020-07-05 DIAGNOSIS — E118 Type 2 diabetes mellitus with unspecified complications: Secondary | ICD-10-CM

## 2020-07-05 NOTE — Telephone Encounter (Signed)
Pt's husband calls for refills, states pt is taking a whole glipizide instead of 1/2 because 1/2 does not work. Also states she is not taking farxiga. Seems confused about meds, states cbg 266 and more.  appt 11/10 at 1315

## 2020-07-06 ENCOUNTER — Ambulatory Visit (INDEPENDENT_AMBULATORY_CARE_PROVIDER_SITE_OTHER): Payer: Self-pay | Admitting: Internal Medicine

## 2020-07-06 ENCOUNTER — Encounter: Payer: Self-pay | Admitting: Internal Medicine

## 2020-07-06 DIAGNOSIS — K7469 Other cirrhosis of liver: Secondary | ICD-10-CM

## 2020-07-06 DIAGNOSIS — E118 Type 2 diabetes mellitus with unspecified complications: Secondary | ICD-10-CM

## 2020-07-06 MED ORDER — GLIPIZIDE 10 MG PO TABS: 10.0000 mg | ORAL_TABLET | Freq: Every day | ORAL | 2 refills | Status: DC

## 2020-07-06 NOTE — Assessment & Plan Note (Signed)
She is currently on glipizide 2.5 mg daily.  She had previously been on [place] however states that this caused significant abdominal discomfort and headache so she is not taking it. She reports that her blood sugars have been elevated. Her blood sugars have been around 260s-300s.  Her glucometer readings showed that she was above target 55% of the time, within target 45% of the time, and below target 0% of the time, average reading was 205. She denies any polyuria, polydipsia, changes in appetite, numbness or tingling in hands of feet.    Patient has had significant complications with various medications.  She also is uninsured which makes affordability of medications an issue.  She is unable to take Metformin due to her cirrhosis.  Januvia caused her to have dizziness and nausea.  She does not want to take Canagliflozin due to concerns of the limb amputations. Last A1c was 8.6.  We discussed increasing the glipizide to 10 mg daily.  Discussed side effects including hypoglycemia and things to monitor for.  -Increase glipizide to 10 mg daily -Continue to frequent CBG checks -RTC in 1 month -Considered pioglitazone, patient has no history of heart failure however patient has history of cirrhosis and dilated [place] labeling system hepatic failure is a contraindication -May require starting insulin if CBGs are not well controlled

## 2020-07-06 NOTE — Assessment & Plan Note (Signed)
Currently on Coreg 3.125 mg twice daily, Lasix 20 mg daily, and spironolactone 50 mg daily.  Appears euvolemic on exam.  Continues to follow with Dr. Sharla Kidney for monitoring of her cirrhosis.  No changes at this time.

## 2020-07-06 NOTE — Patient Instructions (Addendum)
[name],  It was a pleasure to see you today. Thank you for coming in.   Today we discussed your diabetes, your blood sugars have been very high. In regards to this please increase the glipizide to 10 mg daily. Please continue to monitor your blood sugars and bring in your meter on your next visit. We may need to add additional medications at that time. Please monitor for low blood sugars.   Please return to clinic in 1 months or sooner if needed.   Thank you again for coming in.   Lonia Skinner M.D.  [place]. Cecilia Deatra Ina,  [place] un placer verte hoy. Gracias por venir.  Hoy hablamos de su diabetes, sus niveles de azcar en sangre han sido muy altos. Con respecto a esto, aumente la glipizida a 10 mg al [employer]. Contine controlando sus niveles de azcar en sangre y traiga su medidor en su prxima visita. Es posible que debamos agregar medicamentos adicionales en ese momento. Controle los niveles bajos de Museum/gallery exhibitions officer.  Regrese a Copy en 1 mes o antes si es necesario.  Gracias de nuevo por venir.  Asencion Noble.D.

## 2020-07-06 NOTE — Progress Notes (Signed)
   CC: Diabetes follow up  HPI:  [name] is a 54 y.o. with the history listed below presenting for follow up of her diabetes.   Past Medical History:  Diagnosis Date  . Diabetes mellitus without complication ([place])   . Fatty liver   . NASH (nonalcoholic steatohepatitis)   . Obesity    Review of Systems:   Constitutional: Negative for chills and fever.  Respiratory: Negative for shortness of breath.   Cardiovascular: Negative for chest pain and leg swelling.  Gastrointestinal: Negative for abdominal pain, nausea and vomiting.  Neurological: Negative for dizziness and headaches.   Physical Exam:  Vitals:   07/06/20 1313  BP: (!) 119/42  Pulse: 66  Temp: 98.1 F (36.7 C)  TempSrc: Oral  SpO2: 99%  Weight: 216 lb 6.4 oz (98.2 kg)  Height: 4' 11"  (1.499 m)   Physical Exam HENT:     Head: Normocephalic and atraumatic.  Eyes:     Conjunctiva/sclera: Conjunctivae normal.     Pupils: Pupils are equal, round, and reactive to light.  Neck:     Thyroid: No thyromegaly.  Cardiovascular:     Rate and Rhythm: Normal rate and regular rhythm.     Heart sounds: Normal heart sounds. No murmur heard.  No friction rub. No gallop.   Pulmonary:     Effort: Pulmonary effort is normal. No respiratory distress.     Breath sounds: Normal breath sounds. No wheezing.  Abdominal:     General: Bowel sounds are normal. There is no distension.     Palpations: Abdomen is soft.  Musculoskeletal:        General: Normal range of motion.     Cervical back: Normal range of motion and neck supple.  Skin:    General: Skin is warm and dry.     Findings: No erythema.  Neurological:     Mental Status: She is alert and oriented to person, place, and time.     Gait: Gait is intact.  Psychiatric:        Mood and Affect: Mood and affect normal.      Assessment & Plan:   See Encounters Tab for problem based charting.  Patient discussed with Dr. Jimmye Norman

## 2020-07-07 NOTE — Progress Notes (Signed)
Internal Medicine Clinic Attending  Case discussed with Dr. Sherry Ruffing  At the time of the visit.  We reviewed the resident's history and exam and pertinent patient test results.  I agree with the assessment, diagnosis, and plan of care documented in the resident's note.

## 2020-07-19 ENCOUNTER — Ambulatory Visit (INDEPENDENT_AMBULATORY_CARE_PROVIDER_SITE_OTHER): Payer: Self-pay | Admitting: Gastroenterology

## 2020-07-19 ENCOUNTER — Other Ambulatory Visit (INDEPENDENT_AMBULATORY_CARE_PROVIDER_SITE_OTHER): Payer: Self-pay

## 2020-07-19 VITALS — BP 110/68 | HR 78 | Ht 59.0 in | Wt 215.0 lb

## 2020-07-19 DIAGNOSIS — K7460 Unspecified cirrhosis of liver: Secondary | ICD-10-CM

## 2020-07-19 DIAGNOSIS — K7469 Other cirrhosis of liver: Secondary | ICD-10-CM

## 2020-07-19 DIAGNOSIS — I8510 Secondary esophageal varices without bleeding: Secondary | ICD-10-CM

## 2020-07-19 DIAGNOSIS — K7581 Nonalcoholic steatohepatitis (NASH): Secondary | ICD-10-CM

## 2020-07-19 LAB — HEPATIC FUNCTION PANEL
ALT: 43 U/L — ABNORMAL HIGH (ref 0–35)
AST: 48 U/L — ABNORMAL HIGH (ref 0–37)
Albumin: 3.1 g/dL — ABNORMAL LOW (ref 3.5–5.2)
Alkaline Phosphatase: 177 U/L — ABNORMAL HIGH (ref 39–117)
Bilirubin, Direct: 0.7 mg/dL — ABNORMAL HIGH (ref 0.0–0.3)
Total Bilirubin: 2.4 mg/dL — ABNORMAL HIGH (ref 0.2–1.2)
Total Protein: 7 g/dL (ref 6.0–8.3)

## 2020-07-19 LAB — PROTIME-INR
INR: 1.2 ratio — ABNORMAL HIGH (ref 0.8–1.0)
Prothrombin Time: 13.8 s — ABNORMAL HIGH (ref 9.6–13.1)

## 2020-07-19 MED ORDER — CARVEDILOL 3.125 MG PO TABS: 3.1250 mg | ORAL_TABLET | Freq: Two times a day (BID) | ORAL | 2 refills | Status: DC

## 2020-07-19 NOTE — Patient Instructions (Addendum)
If you are age 55 or older, your body mass index should be between 23-30. Your Body mass index is 43.42 kg/m. If this is out of the aforementioned range listed, please consider follow up with your Primary Care Provider.  If you are age 15 or younger, your body mass index should be between 19-25. Your Body mass index is 43.42 kg/m. If this is out of the aformentioned range listed, please consider follow up with your Primary Care Provider.   You have been scheduled for an abdominal ultrasound at [facility] (1st floor of hospital) on 07-27-2020 at 10:30am. Please arrive 15 minutes prior to your appointment for registration. Make certain not to have anything to eat or drink 6 hours prior to your appointment. Should you need to reschedule your appointment, please contact radiology at [phone]. This test typically takes about 30 minutes to perform.  Your provider has requested that you go to the basement level for lab work before leaving today. Press "B" on the elevator. The lab is located at the first door on the left as you exit the elevator.  Due to recent changes in healthcare laws, you may see the results of your imaging and laboratory studies on MyChart before your provider has had a chance to review them.  We understand that in some cases there may be results that are confusing or concerning to you. Not all laboratory results come back in the same time frame and the provider may be waiting for multiple results in order to interpret others.  Please give [place] 48 hours in order for your provider to thoroughly review all the results before contacting the office for clarification of your results.   It was a pleasure to see you today!  Dr. Loletha Carrow

## 2020-07-19 NOTE — Progress Notes (Signed)
[facility] Progress Note  Chief Complaint: Deborah Pham related cirrhosis  Subjective  History: Last seen November 2020 for [facility] related cirrhosis. Peripheral edema on low-dose diuretics, on primary beta-blocker prophylaxis for esophageal varices discovered January 2018. Deborah Pham was seen with the Spanish interpreter today.  She has generally been well, and trying to get better control of her diabetes.  She was having some peripheral edema that resolved on low-dose diuretics.  Denies abdominal pain, nausea vomiting altered bowel habits or rectal bleeding.  She has undergone annual FOBT through medical clinic screening program, and says she did stool cards again recently but has not heard the results.  No result in the EMR yet. She feels like her memory is good and thought pattern normal, no gait unsteadiness or slurred speech.  Sleep is reasonably good.  ROS: Cardiovascular:  no chest pain Respiratory: no dyspnea Remainder of systems negative except as above The patient's Past Medical, Family and Social History were reviewed and are on file in the EMR.  Objective:  Med list reviewed  Current Outpatient Medications:  .  carvedilol (COREG) 3.125 MG tablet, Take 1 tablet (3.125 mg total) by mouth 2 (two) times daily with a meal., Disp: 180 tablet, Rfl: 2 .  furosemide (LASIX) 20 MG tablet, Take 1 tablet (20 mg total) by mouth daily., Disp: 30 tablet, Rfl: 5 .  glipiZIDE (GLUCOTROL) 10 MG tablet, Take 1 tablet (10 mg total) by mouth daily before breakfast., Disp: 30 tablet, Rfl: 2 .  glucose blood (CONTOUR NEXT TEST) test strip, Use as instructed, Disp: 100 each, Rfl: 12 .  spironolactone (ALDACTONE) 50 MG tablet, Take 1 tablet (50 mg total) by mouth daily., Disp: 30 tablet, Rfl: 5   Vital signs in last 24 hrs: Vitals:   07/19/20 1443  BP: 110/68  Pulse: 78  SpO2: 98%    Physical Exam   HEENT: sclera anicteric, oral mucosa moist without lesions  Neck: supple, no thyromegaly,  JVD or lymphadenopathy  Cardiac: RRR without murmurs, S1S2 heard, no peripheral edema  Pulm: clear to auscultation bilaterally, normal RR and effort noted  Abdomen: soft, morbidly obese, no tenderness, with active bowel sounds. No guarding or palpable hepatosplenomegaly, the limited by body habitus  Skin; warm and dry, no jaundice or rash  Labs:  CBC Latest Ref Rng & Units 06/11/2018 02/21/2017 06/23/2016  WBC 4.0 - 10.5 K/uL 5.6 4.6 12.6(H)  Hemoglobin 12.0 - 15.0 g/dL 16.1(H) 14.9 14.0  Hematocrit 36 - 46 % 45.1 40.4 38.3  Platelets 150 - 400 K/uL 86.0(L) 96(LL) 127(L)   CMP Latest Ref Rng & Units 06/24/2020 11/09/2019 06/01/2019  Glucose 65 - 99 mg/dL 402(H) 114(H) 385(H)  BUN 6 - 24 mg/dL 12 9 8   Creatinine 0.57 - 1.00 mg/dL 0.87 0.51(L) 0.72  Sodium 134 - 144 mmol/L 133(L) 140 137  Potassium 3.5 - 5.2 mmol/L 4.2 3.5 3.7  Chloride 96 - 106 mmol/L 98 108(H) 106  CO2 20 - 29 mmol/L 21 19(L) 20  Calcium 8.7 - 10.2 mg/dL 9.3 8.8 8.7  Total Protein 6.0 - 8.3 g/dL - - -  Total Bilirubin 0.2 - 1.2 mg/dL - - -  Alkaline Phos 39 - 117 U/L - - -  AST 0 - 37 U/L - - -  ALT 0 - 35 U/L - - -   Recent hemoglobin A1c 8.6  Lab Results  Component Value Date   INR 1.2 (H) 06/29/2019   INR 1.2 (H) 06/11/2018   INR 1.1 (  H) 11/25/2017     Last AFP elevated at 10.8 a year ago (was 8.0 October 20 19th, 9.29 November 2017, 7.28 January 2017) ___________________________________________ Radiologic studies:  Last ultrasound November 2020 CLINICAL DATA:  History of cirrhosis. Elevated liver function tests.   EXAM: ULTRASOUND ABDOMEN LIMITED RIGHT UPPER QUADRANT   COMPARISON:  CT abdomen and pelvis 06/21/2016. Right upper quadrant ultrasound 06/07/2017.   FINDINGS: Gallbladder:   No gallstones or wall thickening visualized. No sonographic Murphy sign noted by sonographer.   Common bile duct:   Diameter: 0.3 cm.   Liver:   No focal lesion. The liver is shrunken with a nodular  border consistent with cirrhosis as seen on the prior exams. No ascites is seen. Portal vein is patent on color Doppler imaging with normal direction of blood flow towards the liver.   IMPRESSION: Cirrhotic liver.  No focal abnormality.   Normal gallbladder.     Electronically Signed   By: Inge Rise M.D.   On: 11/29/2017 16:24     ____________________________________________ Other:   _____________________________________________ Assessment & Plan  Assessment: Encounter Diagnoses  Name Primary?  . Other cirrhosis of liver ([place])   . 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 Liver cirrhosis secondary to NASH [facility])    Deborah Pham related cirrhosis, nonbleeding esophageal varices on primary prophylaxis, thrombocytopenia from portal hypertension. Relatively low meld score, has not previously needed liver transplant clinic evaluation.  No history of GI bleeding or hepatic encephalopathy.  Mild volume overload controlled on low-dose diuretics and sodium restriction. Hepatitis A immune Up-to-date colorectal cancer screening with FOBT.    Patient remains uninsured. Plan:  AFP, INR, hepatic function panel Right upper quadrant ultrasound for hepatoma screening. See me in 6 months or sooner as needed.  30 minutes were spent on this encounter (including chart review, history/exam, counseling/coordination of care, and documentation)  [name]

## 2020-07-20 LAB — AFP TUMOR MARKER: AFP-Tumor Marker: 219.9 ng/mL — ABNORMAL HIGH

## 2020-07-25 ENCOUNTER — Other Ambulatory Visit: Payer: Self-pay

## 2020-07-25 ENCOUNTER — Telehealth: Payer: Self-pay | Admitting: Gastroenterology

## 2020-07-25 DIAGNOSIS — K7469 Other cirrhosis of liver: Secondary | ICD-10-CM

## 2020-07-25 DIAGNOSIS — R772 Abnormality of alphafetoprotein: Secondary | ICD-10-CM

## 2020-07-25 DIAGNOSIS — K760 Fatty (change of) liver, not elsewhere classified: Secondary | ICD-10-CM

## 2020-07-25 NOTE — Telephone Encounter (Signed)
Patient calling for lab results stated if she does not answer because she is at work to please leave a detailed message

## 2020-07-25 NOTE — Telephone Encounter (Signed)
Dr. Loletha Carrow, please advise. Thank you

## 2020-07-25 NOTE — Telephone Encounter (Signed)
(  Spanish-speaking patient with cirrhosis)  Liver labs about stable compared to before.  Importantly, the AFP tumor marker lab is significantly increased.  We talked at the recent visit about how cirrhosis increases the risk of liver cancer, and also how an elevation of this lab value can happen in cirrhosis and does not necessarily mean liver cancer.  However, cancer is possible and this AFP elevation means we need to take a closer look at the liver.  An ultrasound is scheduled for this week, but that is not a detailed enough test when the AFP is elevated. The ultrasound must be canceled and a CT scan of the liver with and without contrast must be scheduled for within the next two weeks.  - HD

## 2020-07-25 NOTE — Telephone Encounter (Signed)
Spoke with patient in regards to her results and next steps per Dr. Loletha Carrow. Pt is aware that I will be cancelling her [place] appt on 07/27/20, she is aware that I will be scheduling her for a CT scan instead.  Answered all of patient's questions, she requested that I send appt information via My Chart.   Patient has been scheduled for a CT scan on 08/05/20 at 8:30 AM, arrival time 8:15 AM. NPO 4 hours prior, pick up contrast. Drink 1st bottle at 6:30 AM, second bottle at 7:30 AM. Will notify patient via My Chart.

## 2020-07-27 ENCOUNTER — Ambulatory Visit (HOSPITAL_COMMUNITY): Payer: Self-pay

## 2020-08-05 ENCOUNTER — Encounter (HOSPITAL_COMMUNITY): Payer: Self-pay

## 2020-08-05 ENCOUNTER — Ambulatory Visit (HOSPITAL_COMMUNITY)
Admission: RE | Admit: 2020-08-05 | Discharge: 2020-08-05 | Disposition: A | Discharge status: Home / Self Care | Payer: Self-pay | Source: Ambulatory Visit | Attending: Gastroenterology | Admitting: Gastroenterology

## 2020-08-05 ENCOUNTER — Other Ambulatory Visit: Payer: Self-pay

## 2020-08-05 DIAGNOSIS — K7469 Other cirrhosis of liver: Secondary | ICD-10-CM | POA: Insufficient documentation

## 2020-08-05 DIAGNOSIS — R772 Abnormality of alphafetoprotein: Secondary | ICD-10-CM | POA: Insufficient documentation

## 2020-08-05 DIAGNOSIS — K760 Fatty (change of) liver, not elsewhere classified: Secondary | ICD-10-CM | POA: Insufficient documentation

## 2020-08-05 LAB — POCT I-STAT CREATININE: Creatinine, Ser: 0.6 mg/dL (ref 0.44–1.00)

## 2020-08-05 MED ORDER — SODIUM CHLORIDE (PF) 0.9 % IJ SOLN
INTRAMUSCULAR | Status: AC
  Filled 2020-08-05: qty 50

## 2020-08-05 MED ORDER — IOHEXOL 300 MG/ML  SOLN
100.0000 mL | Freq: Once | INTRAMUSCULAR | Status: AC | PRN
  Administered 2020-08-05: 100 mL via INTRAVENOUS

## 2020-08-08 ENCOUNTER — Ambulatory Visit: Payer: Self-pay | Admitting: Internal Medicine

## 2020-08-08 ENCOUNTER — Telehealth: Payer: Self-pay | Admitting: Gastroenterology

## 2020-08-08 ENCOUNTER — Encounter: Payer: Self-pay | Admitting: Internal Medicine

## 2020-08-08 ENCOUNTER — Other Ambulatory Visit: Payer: Self-pay

## 2020-08-08 DIAGNOSIS — E118 Type 2 diabetes mellitus with unspecified complications: Secondary | ICD-10-CM

## 2020-08-08 LAB — GLUCOSE, CAPILLARY: Glucose-Capillary: 263 mg/dL — ABNORMAL HIGH (ref 70–99)

## 2020-08-08 NOTE — Patient Instructions (Signed)
To Deborah Pham,  It was a pleasure meeting you today! We will keep your glipizide today, but please continue to work on your diet for your follow up appointment on January 2022! We will see you in a month!  Sincerely,  [name], MD  A la Sra. Emmie Niemann un placer conocerte hoy! Conservaremos su glipizida hoy, pero contine trabajando en su dieta para su cita de seguimiento en enero de 2022! Nos vemos en un mes! Atentamente, Dr. [name]

## 2020-08-08 NOTE — Telephone Encounter (Signed)
Pt calling for CT results, please advise.

## 2020-08-08 NOTE — Telephone Encounter (Signed)
(  Spanish-speaking patient.  The CT scan was done because routine surveillance alpha-fetoprotein had significantly increased from previous values)  Please give her the good news that the radiologist does not see any mass in the liver.  So although the screening tumor marker blood test was elevated, there is no evidence of liver cancer on this CT scan.  Incidentally, there is a gallbladder stone, which we have known from previous testing.  No further action needed that. There is also a very small spot on the pancreas unchanged from 4 years ago, indicating that it is almost certainly benign.  I was originally planning to see her in 16-monthfollow-up for the cirrhosis, but now I would like it to be 3 months, at which time I will recheck her AFP. If the February schedule is already out, then please give her a clinic appointment.  If not, then please put in clinical reminder so she gets an appointment with me when the schedule is released

## 2020-08-08 NOTE — Telephone Encounter (Signed)
Patient calling for CT results

## 2020-08-09 NOTE — Telephone Encounter (Signed)
Spoke with patient in regards to her CT scan results and recommendations per Dr. Loletha Carrow. Patient has been scheduled for a follow up on Tuesday, 10/04/2020 at 2:20 PM. Patient states that she needs a refill on Coreg, advised that she should have 2 refills. Advised patient to contact the pharmacy first, advised to call us back if they needed a new prescription. Will mail appointment information to patient - she confirmed address on file. Patient verbalized understanding of all information and had no concerns at the end of the call.

## 2020-08-09 NOTE — Progress Notes (Signed)
   CC: Diabetes Medication Follow Up  HPI:  [name] is a 54 y.o. M/F, with a PMH noted below, who presents to the clinic for DM medication follow up. To see the management of their acute and chronic conditions, please see the A&P note under the Encounters tab.   Past Medical History:  Diagnosis Date  . Diabetes mellitus without complication ([place])   . Fatty liver   . Gastric varices   . NASH (nonalcoholic steatohepatitis)   . Obesity    Review of Systems:   Review of Systems  Constitutional: Negative for chills, fever and weight loss.  Cardiovascular: Negative for chest pain and palpitations.  Gastrointestinal: Negative for abdominal pain, constipation, diarrhea, nausea and vomiting.     Physical Exam:  Vitals:   08/08/20 1520  BP: 119/73  Pulse: 79  Temp: 98 F (36.7 C)  TempSrc: Oral  SpO2: 99%  Weight: 215 lb 12.8 oz (97.9 kg)  Height: 4' 11"  (1.499 m)   Physical Exam Constitutional:      General: She is not in acute distress.    Appearance: Normal appearance. She is obese.  Cardiovascular:     Rate and Rhythm: Normal rate and regular rhythm.     Pulses: Normal pulses.     Heart sounds: Normal heart sounds. No murmur heard. No friction rub. No gallop.   Pulmonary:     Effort: Pulmonary effort is normal.     Breath sounds: Normal breath sounds. No wheezing, rhonchi or rales.  Abdominal:     General: Abdomen is flat. Bowel sounds are normal.     Palpations: Abdomen is soft.     Tenderness: There is no abdominal tenderness. There is no guarding.  Neurological:     Mental Status: She is alert and oriented to person, place, and time.  Psychiatric:        Mood and Affect: Mood normal.        Behavior: Behavior normal.      Assessment & Plan:   See Encounters Tab for problem based charting.  Patient discussed with Dr. Dareen Piano

## 2020-08-09 NOTE — Assessment & Plan Note (Addendum)
Seen today for her diabetes after changing her glipizide to 10 mg daily. Patient brought her glucometer, which shows readings averaging 255 blood glucose. She is over goal 89% of her readings with a high of 518. She has failed multiple medications secondary to side effects. Glipizide is a poor medication choice for her, but she is resistant to starting insulin, because she does not enjoy needles. She states that she would like to attempt dietary measures, and states she cut out sweets this past weekend. We discussed lifestyle management, but also continued to emphasize that insulin will more than likely be needed. Will continue current medication course and reassess in January. Ultimately, patient may need to start on insulin.  - Reassess A1c at next visit.  - Patient to start dietary measures and lifestyle changes.  - May need to start on insulin if sugars do not improve at next visit.

## 2020-08-10 ENCOUNTER — Encounter: Payer: Self-pay | Admitting: Internal Medicine

## 2020-08-10 NOTE — Progress Notes (Signed)
Internal Medicine Clinic Attending  Case discussed with Dr. Gilford Rile  At the time of the visit.  We reviewed the resident's history and exam and pertinent patient test results.  I agree with the assessment, diagnosis, and plan of care documented in the resident's note.

## 2020-09-26 ENCOUNTER — Ambulatory Visit: Payer: Self-pay | Admitting: Student

## 2020-09-26 ENCOUNTER — Encounter: Payer: Self-pay | Admitting: Student

## 2020-09-26 VITALS — BP 125/77 | HR 60 | Temp 97.5°F | Ht 59.0 in | Wt 208.6 lb

## 2020-09-26 DIAGNOSIS — K7469 Other cirrhosis of liver: Secondary | ICD-10-CM

## 2020-09-26 DIAGNOSIS — E118 Type 2 diabetes mellitus with unspecified complications: Secondary | ICD-10-CM

## 2020-09-26 DIAGNOSIS — R600 Localized edema: Secondary | ICD-10-CM

## 2020-09-26 DIAGNOSIS — G5712 Meralgia paresthetica, left lower limb: Secondary | ICD-10-CM

## 2020-09-26 LAB — GLUCOSE, CAPILLARY: Glucose-Capillary: 321 mg/dL — ABNORMAL HIGH (ref 70–99)

## 2020-09-26 LAB — POCT GLYCOSYLATED HEMOGLOBIN (HGB A1C): Hemoglobin A1C: 7.1 % — AB (ref 4.0–5.6)

## 2020-09-26 MED ORDER — FUROSEMIDE 20 MG PO TABS
20.0000 mg | ORAL_TABLET | Freq: Every day | ORAL | 1 refills | Status: DC
Start: 2020-09-26 — End: 2020-11-05

## 2020-09-26 MED ORDER — SPIRONOLACTONE 50 MG PO TABS: 50.0000 mg | ORAL_TABLET | Freq: Every day | ORAL | 1 refills | Status: DC

## 2020-09-26 MED ORDER — GLIPIZIDE 10 MG PO TABS: 10.0000 mg | ORAL_TABLET | Freq: Every day | ORAL | 1 refills | Status: DC

## 2020-09-26 NOTE — Assessment & Plan Note (Signed)
Patient follows up with Dr. Sharla Kidney.  Denies any abdominal pain, nausea, vomiting or leg swelling.  Currently stable. --Refilled spironolactone 50 mg daily --Refilled Lasix 20 mg daily --Scheduled appointment with Dr. Sharla Kidney on 10/04/20

## 2020-09-26 NOTE — Patient Instructions (Addendum)
Thank you, [name] for allowing [place] to provide your care today. Today we discussed your diabetes and the pain in your left thigh.  This pain is caused by your diabetes and will get better as you have better control your blood sugar.  Avoid tightfitting clothing and use the lidocaine ointment as needed for pain relief.  If the pain does not improve or go away after 4 weeks, call our office to schedule appointment to see one of our doctors.  You have done really well with your blood sugar control so we want you to continue doing all the changes that you have made so far.  I have ordered the following labs for you:  Lab Orders     Glucose, capillary     POC Hbg A1C   I have place a referrals to ophthalmologist for diabetic eye exam  I have refilled all your medications.  Please follow-up in 3 months  Should you have any questions or concerns please call the internal medicine clinic at [phone].    Linwood Dibbles, MD, MPH [facility]   My Chart Access: [email or web address]   If you have not already done so, please get your COVID 19 vaccine  To schedule an appointment for a COVID vaccine choice any of the following: Go to [email or web address]   Go to [email or web address]                  Call [phone]                                     Call [phone] and select Option 2  Gracias, Sra. Deborah Pham [place] por permitirnos brindarle su atencin hoy. Hoy hablamos sobre su diabetes y Conservation officer, historic buildings en su muslo izquierdo. Este dolor es causado por su diabetes y Teacher, English as a foreign language a medida que controle mejor su nivel de Dispensing optician. Evite la ropa [place] y use la pomada de [place] segn sea necesario para Best boy. Si el dolor no mejora o desaparece despus de 4 semanas, llame a nuestra oficina para programar una cita para ver a uno de nuestros mdicos. Le ha ido muy bien con su control  de Dispensing optician, por lo que queremos que contine haciendo todos los cambios que ha hecho [employer].  He ordenado los siguientes laboratorios para usted:  rdenes de Tourist information centre manager, capilar     POC Hbg A1C  Tengo lugar una remisin a oftalmlogo para examen de la vista diabtico  He vuelto a Armed forces training and education officer.  Por favor seguimiento en 3 meses  Si tiene alguna pregunta o inquietud, llame a la clnica de medicina interna al [phone].  Linwood Dibbles, MD, MPH [place] a mi grfico: [email or web address]   Si an no lo ha hecho, Geneticist, molecular su vacuna COVID 19 Para programar una cita para una eleccin de vacuna COVID cualquiera de los siguientes: Vaya a [email or web address] Vaya a [email or web address] Llame al [phone] Susann Givens [phone] y seleccione la Opcin 2

## 2020-09-26 NOTE — Assessment & Plan Note (Addendum)
Patient reported pain on the lateral side of her left thigh that started last night. The pain is described as a burning sensation that is intermittent in nature. Patient has used a lidocaine ointments on her thigh which has given her some temporary relief but the pain still comes and goes throughout the day. Patient is currently using lifestyle modification to control blood sugar. Patient is advised to continue weight loss and blood sugar control as these are weeks to help decrease the burning sensation on her left thigh. Patient is also advised to avoid tightfitting clothes. --Conservative measures such as weight loss, blood sugar control and wearing loose-fitting clothes --Apply lidocaine ointment on thigh as needed for pain control. --Advised to follow-up if symptoms do not improve after a few weeks. --Consider shingles vaccine at next OV

## 2020-09-26 NOTE — Progress Notes (Signed)
   CC: Diabetes follow up.   HPI:  [name] is a 55 y.o. Spanish-speaking woman with PMH as below who presents with chronic for diabetes follow-up. Please see problem based charting for evaluation, assessment and plan.  Past Medical History:  Diagnosis Date  . Diabetes mellitus without complication ([place])   . Fatty liver   . Gastric varices   . NASH (nonalcoholic steatohepatitis)   . Obesity     Review of Systems:  Constitutional: Negative for fatigue, polydipsia or dizziness Eyes: Negative for visual changes Respiratory: Negative for shortness of breath Cardiac: Negative for chest pain MSK: Positive for left thigh pain Abdomen: Negative for abdominal pain, constipation or diarrhea Neuro: Negative for headache or weakness  Physical Exam: General: Pleasant Spanish-speaking woman.  No acute distress. Cardiac: RRR. No murmurs, rubs or gallops. No LE edema.  Palpable pulses. Respiratory: Lungs CTAB. No wheezing or crackles. Abdominal: Soft, symmetric and non tender. Normal BS. Skin: Warm, dry and intact without rashes or lesions MSK: Atraumatic. Full ROM. No ttp to left thigh Neuro: A&O x 3. Moves all extremities.  Normal sensation Psych: Appropriate mood and affect.  Vitals:   09/26/20 1422  BP: 125/77  Pulse: 60  Temp: (!) 97.5 F (36.4 C)  TempSrc: Oral  SpO2: 99%  Weight: 208 lb 9.6 oz (94.6 kg)  Height: 4' 11"  (1.499 m)     Assessment & Plan:   See Encounters Tab for problem based charting.  Patient discussed with Dr. Lockie Pares, MD, MPH

## 2020-09-26 NOTE — Assessment & Plan Note (Addendum)
Encounter was completed with Spanish interpreter via video. Patient presenting to clinic today for diabetes follow-up after making lifestyle modifications to control her blood sugars. Patient's current A1c is down to 7.1 from 8.6 six weeks ago. Her glucose meter shows average glucose of 144 with a minimum of 94 and a maximum of 267. Meter shows that patient has been within the target range 87% of the time and above target 13% of the time.  Patient has also lost 7 lbs since last visit. Patient states she has been feeling better has been eating healthier meals. She is motivated by the fear of having to inject herself with insulin.  She denies any polyuria, polydipsia, blurry vision, lightheadedness, shortness of breath or chest pain.  Patient was commended for making appropriate lifestyle changes and advised to continue this effort. --Refilled glipizide 10 mg daily --Continue dietary measures and lifestyle modifications. --Referred to Dr. Katy Fitch for diabetic eye exam --Follow-up in 3 months for repeat A1c

## 2020-09-27 NOTE — Addendum Note (Signed)
Addended by: Lalla Brothers T on: 09/27/2020 10:46 AM   Modules accepted: Level of Service

## 2020-09-27 NOTE — Progress Notes (Signed)
Internal Medicine Clinic Attending  Case discussed with Dr. Amponsah  At the time of the visit.  We reviewed the resident's history and exam and pertinent patient test results.  I agree with the assessment, diagnosis, and plan of care documented in the resident's note.  

## 2020-10-04 ENCOUNTER — Ambulatory Visit: Payer: No Typology Code available for payment source | Admitting: Gastroenterology

## 2020-10-05 ENCOUNTER — Encounter: Payer: Self-pay | Admitting: Internal Medicine

## 2020-11-01 ENCOUNTER — Other Ambulatory Visit (INDEPENDENT_AMBULATORY_CARE_PROVIDER_SITE_OTHER): Payer: Self-pay

## 2020-11-01 ENCOUNTER — Ambulatory Visit (INDEPENDENT_AMBULATORY_CARE_PROVIDER_SITE_OTHER): Payer: Self-pay | Admitting: Physician Assistant

## 2020-11-01 ENCOUNTER — Encounter: Payer: Self-pay | Admitting: Physician Assistant

## 2020-11-01 VITALS — BP 120/80 | HR 100 | Ht 59.0 in | Wt 228.4 lb

## 2020-11-01 DIAGNOSIS — R772 Abnormality of alphafetoprotein: Secondary | ICD-10-CM

## 2020-11-01 DIAGNOSIS — K7581 Nonalcoholic steatohepatitis (NASH): Secondary | ICD-10-CM

## 2020-11-01 DIAGNOSIS — K7460 Unspecified cirrhosis of liver: Secondary | ICD-10-CM

## 2020-11-01 DIAGNOSIS — R188 Other ascites: Secondary | ICD-10-CM

## 2020-11-01 LAB — CBC WITH DIFFERENTIAL/PLATELET
Basophils Absolute: 0 10*3/uL (ref 0.0–0.1)
Basophils Relative: 1.1 % (ref 0.0–3.0)
Eosinophils Absolute: 0 10*3/uL (ref 0.0–0.7)
Eosinophils Relative: 0.2 % (ref 0.0–5.0)
HCT: 34 % — ABNORMAL LOW (ref 36.0–46.0)
Hemoglobin: 12.2 g/dL (ref 12.0–15.0)
Lymphocytes Relative: 15.4 % (ref 12.0–46.0)
Lymphs Abs: 0.6 10*3/uL — ABNORMAL LOW (ref 0.7–4.0)
MCHC: 35.8 g/dL (ref 30.0–36.0)
MCV: 97.1 fl (ref 78.0–100.0)
Monocytes Absolute: 0.4 10*3/uL (ref 0.1–1.0)
Monocytes Relative: 9.3 % (ref 3.0–12.0)
Neutro Abs: 3 10*3/uL (ref 1.4–7.7)
Neutrophils Relative %: 74 % (ref 43.0–77.0)
Platelets: 63 10*3/uL — ABNORMAL LOW (ref 150.0–400.0)
RBC: 3.51 Mil/uL — ABNORMAL LOW (ref 3.87–5.11)
RDW: 14.7 % (ref 11.5–15.5)
WBC: 4.1 10*3/uL (ref 4.0–10.5)

## 2020-11-01 LAB — COMPREHENSIVE METABOLIC PANEL
ALT: 53 U/L — ABNORMAL HIGH (ref 0–35)
AST: 135 U/L — ABNORMAL HIGH (ref 0–37)
Albumin: 2.4 g/dL — ABNORMAL LOW (ref 3.5–5.2)
Alkaline Phosphatase: 400 U/L — ABNORMAL HIGH (ref 39–117)
BUN: 25 mg/dL — ABNORMAL HIGH (ref 6–23)
CO2: 27 mEq/L (ref 19–32)
Calcium: 8.8 mg/dL (ref 8.4–10.5)
Chloride: 89 mEq/L — ABNORMAL LOW (ref 96–112)
Creatinine, Ser: 1.08 mg/dL (ref 0.40–1.20)
GFR: 57.68 mL/min — ABNORMAL LOW (ref >60.00)
Glucose, Bld: 178 mg/dL — ABNORMAL HIGH (ref 70–99)
Potassium: 3.5 mEq/L (ref 3.5–5.1)
Sodium: 126 mEq/L — ABNORMAL LOW (ref 135–145)
Total Bilirubin: 7.8 mg/dL — ABNORMAL HIGH (ref 0.2–1.2)
Total Protein: 6 g/dL (ref 6.0–8.3)

## 2020-11-01 NOTE — Progress Notes (Signed)
Chief Complaint: Follow-up cirrhosis  HPI:    Deborah Pham is a 55 year old Spanish-speaking female, who was referred to me by [name], MD for follow-up of cirrhosis.    07/19/2020 patient seen in clinic by Dr. Loletha Carrow.  At that time was discussed that she had last been seen November 2020 for [facility] related cirrhosis.  Peripheral edema on low-dose diuretics on primary beta-blocker prophylaxis for esophageal varices discovered January 2018.  At that time she was generally well.  She did have some peripheral edema which resolved on low-dose diuretics.  At that time AFP, INR, hepatic function panel and CT for [facility] screening were ordered after AFP was found to be elevated.  Follow-up recommended in 6 months.    07/19/2020 AFP tumor marker was up from 10.8 a year prior to 219.9.  LFTs with a total bilirubin of 2.4, direct 0.7, alk phos 177, AST 48, ALT 43, albumin 3.1.  INR 1.2, PT 13.8.    08/05/2020 CT of the abdomen with and without contrast showed cirrhosis with portal hypertension, no HCC, cholelithiasis and a 9 mm low attenuating lesion in the pancreatic body stable from 06/21/2016.  Follow-up imaging is recommended in 1 year.  CT did not show any mass in her liver.  That time is recommend she follow-up in 3 months for recheck of her AFP.    Today, patient presents clinic accompanied by an interpreter, she explains that last Thursday, 10/27/2020 she experienced an increase in swelling of her feet and her stomach, over the past 5 days this has started to go back down, but this made her very nervous.  Tells me that she is compliant with her Spironolactone 50 mg daily and Lasix 20 mg daily, also remains on a low-sodium diet under direction from Dr. Loletha Carrow.  Denies any abdominal pain or other new symptoms.    Denies fever, chills, weight loss or symptoms that awaken her from sleep.  Past Medical History:  Diagnosis Date  . Diabetes mellitus without complication ([place])   . Fatty liver   . Gastric  varices   . NASH (nonalcoholic steatohepatitis)   . Obesity     Past Surgical History:  Procedure Laterality Date  . ESOPHAGOGASTRODUODENOSCOPY      Current Outpatient Medications  Medication Sig Dispense Refill  . carvedilol (COREG) 3.125 MG tablet Take 1 tablet (3.125 mg total) by mouth 2 (two) times daily with a meal. 180 tablet 2  . furosemide (LASIX) 20 MG tablet Take 1 tablet (20 mg total) by mouth daily. 90 tablet 1  . glipiZIDE (GLUCOTROL) 10 MG tablet Take 1 tablet (10 mg total) by mouth daily before breakfast. 90 tablet 1  . glucose blood (CONTOUR NEXT TEST) test strip Use as instructed 100 each 12  . spironolactone (ALDACTONE) 50 MG tablet Take 1 tablet (50 mg total) by mouth daily. IM program 90 tablet 1   No current facility-administered medications for this visit.    Allergies as of 11/01/2020  . (No Known Allergies)    Family History  Problem Relation Age of Onset  . Liver disease Father   . Alcoholism Father   . Diabetes Mother   . Colon cancer Neg Hx   . Breast cancer Neg Hx   . Stomach cancer Neg Hx   . Throat cancer Neg Hx   . Pancreatic cancer Neg Hx     Social History   Socioeconomic History  . Marital status: Married    Spouse name: Not on file  .  Number of children: 2  . Years of education: Not on file  . Highest education level: 12th grade  Occupational History  . Occupation: housewife  Tobacco Use  . Smoking status: Never Smoker  . Smokeless tobacco: Never Used  Vaping Use  . Vaping Use: Never used  Substance and Sexual Activity  . Alcohol use: No  . Drug use: No  . Sexual activity: Yes    Partners: Male    Birth control/protection: Post-menopausal  Other Topics Concern  . Not on file  Social History Narrative  . Not on file   Social Determinants of Health   Financial Resource Strain: Not on file  Food Insecurity: Not on file  Transportation Needs: No Transportation Needs  . Lack of Transportation (Medical): No  . Lack of  Transportation (Non-Medical): No  Physical Activity: Not on file  Stress: Not on file  Social Connections: Not on file  Intimate Partner Violence: Not on file    Review of Systems:    Constitutional: No weight loss, fever or chills Cardiovascular: No chest pain   Respiratory: No SOB  Gastrointestinal: See HPI and otherwise negative   Physical Exam:  Vital signs: BP 120/80 (BP Location: Left Arm, Patient Position: Sitting, Cuff Size: Normal)   Pulse 100   Ht _0  (1.499 m) Comment: height measured without shoes  Wt 228 lb 6 oz (103.6 kg)   BMI 46.13 kg/m   Constitutional:   Pleasant overweight Hispanic female appears to be in NAD, Well developed, Well nourished, alert and cooperative Head:  Normocephalic and atraumatic. Eyes:   PEERL, EOMI. +icterus. Conjunctiva pink. Respiratory: Respirations even and unlabored. Lungs clear to auscultation bilaterally.   No wheezes, crackles, or rhonchi.  Cardiovascular: Normal S1, S2. No MRG. Regular rate and rhythm. +periperal edema b/l up to level of the knee, 2+ Gastrointestinal:  Soft, nondistended, nontender. No rebound or guarding. Normal bowel sounds. No appreciable masses or hepatomegaly. Rectal:  Not performed.  Psychiatric:  Demonstrates good judgement and reason without abnormal affect or behaviors.  RELEVANT LABS AND IMAGING: CBC    Component Value Date/Time   WBC 5.6 06/11/2018 1626   RBC 4.67 06/11/2018 1626   HGB 16.1 (H) 06/11/2018 1626   HGB 14.9 02/21/2017 1603   HCT 45.1 06/11/2018 1626   HCT 40.4 02/21/2017 1603   PLT 86.0 (L) 06/11/2018 1626   PLT 96 (LL) 02/21/2017 1603   MCV 96.6 06/11/2018 1626   MCV 93 02/21/2017 1603   MCH 34.3 (H) 02/21/2017 1603   MCH 32.4 06/23/2016 0543   MCHC 35.7 06/11/2018 1626   RDW 13.4 06/11/2018 1626   RDW 13.8 02/21/2017 1603   LYMPHSABS 2.1 06/11/2018 1626   MONOABS 0.6 06/11/2018 1626   EOSABS 0.2 06/11/2018 1626   BASOSABS 0.0 06/11/2018 1626    CMP     Component  Value Date/Time   NA 133 (L) 06/24/2020 1500   K 4.2 06/24/2020 1500   CL 98 06/24/2020 1500   CO2 21 06/24/2020 1500   GLUCOSE 402 (H) 06/24/2020 1500   GLUCOSE 375 (H) 07/04/2018 1459   BUN 12 06/24/2020 1500   CREATININE 0.60 08/05/2020 0836   CALCIUM 9.3 06/24/2020 1500   PROT 7.0 07/19/2020 1537   PROT 6.9 02/21/2017 1603   ALBUMIN 3.1 (L) 07/19/2020 1537   ALBUMIN 3.6 02/21/2017 1603   AST 48 (H) 07/19/2020 1537   ALT 43 (H) 07/19/2020 1537   ALKPHOS 177 (H) 07/19/2020 1537   BILITOT 2.4 (H)  07/19/2020 1537   BILITOT 0.9 02/21/2017 1603   GFRNONAA 75 06/24/2020 1500   GFRAA 87 06/24/2020 1500    Assessment: 1.  Deborah Pham cirrhosis with portal hypertension and ascites: Ascites worsened 5 days ago, now getting better, currently on Lasix 20 mg daily and Spironolactone 50 mg daily 2.  Elevated AFP: At last check AFP was elevated from previous, CT did not show any signs of HCC, repeat AFP was recommended in 3 months  Plan: 1.  Labs today to include a CBC, CMP and AFP. 2.  Increase Lasix to 40 mg daily and Spironolactone 200 mg daily. 3.  Recheck CMP in 1 week. 4.  Briefly discussed a paracentesis with patient, but she tells me the fluid has been going back down over the past 5 days and she is not uncomfortable.  Did recommend that she record daily weights so we can see if she is having issues with fluid in the future. 5.  Recommend she remain on a low-sodium diet. 6.  Patient to follow-up with Dr. Loletha Carrow or myself in 4 weeks or sooner if necessary.  Told her to call us if the fluid is getting worse or she develops any abdominal pain or other new symptoms.  Deborah Newer, PA-C [facility] 11/01/2020, 1:57 PM  Cc: [name], MD

## 2020-11-01 NOTE — Patient Instructions (Addendum)
If you are age 56 or older, your body mass index should be between 23-30. Your Body mass index is 46.13 kg/m. If this is out of the aforementioned range listed, please consider follow up with your Primary Care Provider.  If you are age 104 or younger, your body mass index should be between 19-25. Your Body mass index is 46.13 kg/m. If this is out of the aformentioned range listed, please consider follow up with your Primary Care Provider.   Your provider has requested that you go to the basement level for lab work before leaving today. Press "B" on the elevator. The lab is located at the first door on the left as you exit the elevator.  Increase Lasix to 40 mg daily. Take two 20 mg tablets.  Increase Spironolactone 100 mg once daily. Take two 50 mg tablets daily.  Due to recent changes in healthcare laws, you may see the results of your imaging and laboratory studies on MyChart before your provider has had a chance to review them.  We understand that in some cases there may be results that are confusing or concerning to you. Not all laboratory results come back in the same time frame and the provider may be waiting for multiple results in order to interpret others.  Please give [place] 48 hours in order for your provider to thoroughly review all the results before contacting the office for clarification of your results.   Thank you for choosing me and [facility].  Ellouise Newer, PA-C

## 2020-11-02 ENCOUNTER — Other Ambulatory Visit: Payer: Self-pay | Admitting: Internal Medicine

## 2020-11-02 ENCOUNTER — Telehealth: Payer: Self-pay | Admitting: Physician Assistant

## 2020-11-02 ENCOUNTER — Other Ambulatory Visit: Payer: Self-pay | Admitting: Physician Assistant

## 2020-11-02 DIAGNOSIS — R600 Localized edema: Secondary | ICD-10-CM

## 2020-11-02 LAB — AFP TUMOR MARKER: AFP-Tumor Marker: 2033.7 ng/mL — ABNORMAL HIGH

## 2020-11-02 NOTE — Progress Notes (Signed)
____________________________________________________________  Attending physician addendum:  Thank you for sending this case to me. I have reviewed the entire note and agree with the plan.  Thank you for talking with me about her today after lab results returned. Multiple worrisome lab findings, and clinical suspicion high for HCC.  I agree she needs hospitalization today, so thank you for working on that.  Please alert the inpatient consult team.  Wilfrid Lund, MD  ____________________________________________________________

## 2020-11-02 NOTE — Telephone Encounter (Signed)
Called and spoke to [employer] in [place]. Advised that due to abnormal labs she will need to go to the ER to be admitted. She tells me she is not able to go now, but will go first thing in the morning.

## 2020-11-02 NOTE — Progress Notes (Signed)
[facility]  11/02/2020 1522 PM  Spoke with Dr. Loletha Carrow in regards to patient's recent labs which show a very low sodium, increased bilirubin and extremely high AFP.  Unfortunately this means the patient needs to come into the hospital for electrolyte correction and hopefully to get her on the right dose of diuretics as well as have a CT of the abdomen and pelvis with and without contrast for further evaluation of elevated AFP.  Did discuss this with our nursing staff downstairs who are going to call the patient ASAP and let her know to come to the [facility] or [facility] ER to be admitted.  Ellouise Newer, PA-C

## 2020-11-03 ENCOUNTER — Inpatient Hospital Stay (HOSPITAL_COMMUNITY)
Admission: EM | Admit: 2020-11-03 | Discharge: 2020-11-05 | DRG: 433 | Disposition: A | Discharge status: Home / Self Care | Payer: Self-pay | Attending: Student in an Organized Health Care Education/Training Program | Admitting: Student in an Organized Health Care Education/Training Program

## 2020-11-03 ENCOUNTER — Inpatient Hospital Stay (HOSPITAL_COMMUNITY): Payer: No Typology Code available for payment source

## 2020-11-03 ENCOUNTER — Encounter (HOSPITAL_COMMUNITY): Payer: Self-pay | Admitting: *Deleted

## 2020-11-03 ENCOUNTER — Other Ambulatory Visit: Payer: Self-pay

## 2020-11-03 DIAGNOSIS — R772 Abnormality of alphafetoprotein: Secondary | ICD-10-CM

## 2020-11-03 DIAGNOSIS — Z79899 Other long term (current) drug therapy: Secondary | ICD-10-CM

## 2020-11-03 DIAGNOSIS — K7290 Hepatic failure, unspecified without coma: Secondary | ICD-10-CM | POA: Diagnosis present

## 2020-11-03 DIAGNOSIS — Z811 Family history of alcohol abuse and dependence: Secondary | ICD-10-CM

## 2020-11-03 DIAGNOSIS — R519 Headache, unspecified: Secondary | ICD-10-CM | POA: Diagnosis not present

## 2020-11-03 DIAGNOSIS — Z6841 Body Mass Index (BMI) 40.0 and over, adult: Secondary | ICD-10-CM

## 2020-11-03 DIAGNOSIS — E8770 Fluid overload, unspecified: Secondary | ICD-10-CM | POA: Diagnosis present

## 2020-11-03 DIAGNOSIS — E118 Type 2 diabetes mellitus with unspecified complications: Secondary | ICD-10-CM | POA: Diagnosis present

## 2020-11-03 DIAGNOSIS — K7031 Alcoholic cirrhosis of liver with ascites: Secondary | ICD-10-CM

## 2020-11-03 DIAGNOSIS — M7989 Other specified soft tissue disorders: Secondary | ICD-10-CM | POA: Diagnosis present

## 2020-11-03 DIAGNOSIS — Z7984 Long term (current) use of oral hypoglycemic drugs: Secondary | ICD-10-CM

## 2020-11-03 DIAGNOSIS — E871 Hypo-osmolality and hyponatremia: Secondary | ICD-10-CM | POA: Diagnosis present

## 2020-11-03 DIAGNOSIS — R188 Other ascites: Secondary | ICD-10-CM

## 2020-11-03 DIAGNOSIS — E8809 Other disorders of plasma-protein metabolism, not elsewhere classified: Secondary | ICD-10-CM | POA: Diagnosis present

## 2020-11-03 DIAGNOSIS — D689 Coagulation defect, unspecified: Secondary | ICD-10-CM | POA: Diagnosis present

## 2020-11-03 DIAGNOSIS — C220 Liver cell carcinoma: Secondary | ICD-10-CM

## 2020-11-03 DIAGNOSIS — Z833 Family history of diabetes mellitus: Secondary | ICD-10-CM

## 2020-11-03 DIAGNOSIS — K766 Portal hypertension: Secondary | ICD-10-CM

## 2020-11-03 DIAGNOSIS — K7460 Unspecified cirrhosis of liver: Principal | ICD-10-CM | POA: Diagnosis present

## 2020-11-03 DIAGNOSIS — K7581 Nonalcoholic steatohepatitis (NASH): Secondary | ICD-10-CM | POA: Diagnosis present

## 2020-11-03 DIAGNOSIS — E11649 Type 2 diabetes mellitus with hypoglycemia without coma: Secondary | ICD-10-CM | POA: Diagnosis present

## 2020-11-03 DIAGNOSIS — D6959 Other secondary thrombocytopenia: Secondary | ICD-10-CM | POA: Diagnosis present

## 2020-11-03 DIAGNOSIS — Z20822 Contact with and (suspected) exposure to covid-19: Secondary | ICD-10-CM | POA: Diagnosis present

## 2020-11-03 LAB — CBC
HCT: 33.8 % — ABNORMAL LOW (ref 36.0–46.0)
Hemoglobin: 11.8 g/dL — ABNORMAL LOW (ref 12.0–15.0)
MCH: 34.1 pg — ABNORMAL HIGH (ref 26.0–34.0)
MCHC: 34.9 g/dL (ref 30.0–36.0)
MCV: 97.7 fL (ref 80.0–100.0)
Platelets: 54 10*3/uL — ABNORMAL LOW (ref 150–400)
RBC: 3.46 MIL/uL — ABNORMAL LOW (ref 3.87–5.11)
RDW: 14.7 % (ref 11.5–15.5)
WBC: 6.1 10*3/uL (ref 4.0–10.5)
nRBC: 2 % — ABNORMAL HIGH (ref 0.0–0.2)

## 2020-11-03 LAB — BASIC METABOLIC PANEL
Anion gap: 10 (ref 5–15)
Anion gap: 10 (ref 5–15)
Anion gap: 12 (ref 5–15)
BUN: 17 mg/dL (ref 6–20)
BUN: 20 mg/dL (ref 6–20)
BUN: 16 mg/dL (ref 6–20)
CO2: 25 mmol/L (ref 22–32)
CO2: 25 mmol/L (ref 22–32)
CO2: 24 mmol/L (ref 22–32)
Calcium: 9.1 mg/dL (ref 8.9–10.3)
Calcium: 8.7 mg/dL — ABNORMAL LOW (ref 8.9–10.3)
Calcium: 9 mg/dL (ref 8.9–10.3)
Chloride: 90 mmol/L — ABNORMAL LOW (ref 98–111)
Chloride: 91 mmol/L — ABNORMAL LOW (ref 98–111)
Chloride: 89 mmol/L — ABNORMAL LOW (ref 98–111)
Creatinine, Ser: 0.92 mg/dL (ref 0.44–1.00)
Creatinine, Ser: 1.06 mg/dL — ABNORMAL HIGH (ref 0.44–1.00)
Creatinine, Ser: 0.84 mg/dL (ref 0.44–1.00)
GFR, Estimated: >60 mL/min (ref >60)
GFR, Estimated: >60 mL/min (ref >60)
GFR, Estimated: >60 mL/min (ref >60)
Glucose, Bld: 128 mg/dL — ABNORMAL HIGH (ref 70–99)
Glucose, Bld: 110 mg/dL — ABNORMAL HIGH (ref 70–99)
Glucose, Bld: 66 mg/dL — ABNORMAL LOW (ref 70–99)
Potassium: 3.7 mmol/L (ref 3.5–5.1)
Potassium: 3.6 mmol/L (ref 3.5–5.1)
Potassium: 3.4 mmol/L — ABNORMAL LOW (ref 3.5–5.1)
Sodium: 125 mmol/L — ABNORMAL LOW (ref 135–145)
Sodium: 126 mmol/L — ABNORMAL LOW (ref 135–145)
Sodium: 125 mmol/L — ABNORMAL LOW (ref 135–145)

## 2020-11-03 LAB — RESP PANEL BY RT-PCR (FLU A&B, COVID) ARPGX2
Influenza A by PCR: NEGATIVE
Influenza B by PCR: NEGATIVE
SARS Coronavirus 2 by RT PCR: NEGATIVE

## 2020-11-03 LAB — HIV ANTIBODY (ROUTINE TESTING W REFLEX): HIV Screen 4th Generation wRfx: NONREACTIVE

## 2020-11-03 LAB — I-STAT BETA HCG BLOOD, ED (MC, WL, AP ONLY): I-stat hCG, quantitative: <5 m[IU]/mL (ref <5)

## 2020-11-03 LAB — URINALYSIS, ROUTINE W REFLEX MICROSCOPIC
Glucose, UA: NEGATIVE mg/dL
Hgb urine dipstick: NEGATIVE
Ketones, ur: 5 mg/dL — AB
Leukocytes,Ua: NEGATIVE
Nitrite: NEGATIVE
Protein, ur: NEGATIVE mg/dL
Specific Gravity, Urine: 1.016 (ref 1.005–1.030)
pH: 5 (ref 5.0–8.0)

## 2020-11-03 LAB — HEPATIC FUNCTION PANEL
ALT: 60 U/L — ABNORMAL HIGH (ref 0–44)
AST: 175 U/L — ABNORMAL HIGH (ref 15–41)
Albumin: 1.9 g/dL — ABNORMAL LOW (ref 3.5–5.0)
Alkaline Phosphatase: 431 U/L — ABNORMAL HIGH (ref 38–126)
Bilirubin, Direct: 4.1 mg/dL — ABNORMAL HIGH (ref 0.0–0.2)
Indirect Bilirubin: 3.6 mg/dL — ABNORMAL HIGH (ref 0.3–0.9)
Total Bilirubin: 7.7 mg/dL — ABNORMAL HIGH (ref 0.3–1.2)
Total Protein: 5.8 g/dL — ABNORMAL LOW (ref 6.5–8.1)

## 2020-11-03 LAB — OSMOLALITY: Osmolality: 269 mOsm/kg — ABNORMAL LOW (ref 275–295)

## 2020-11-03 LAB — PROTIME-INR
INR: 1.4 — ABNORMAL HIGH (ref 0.8–1.2)
Prothrombin Time: 16.8 seconds — ABNORMAL HIGH (ref 11.4–15.2)

## 2020-11-03 LAB — LIPID PANEL
Cholesterol: 208 mg/dL — ABNORMAL HIGH (ref 0–200)
HDL: <10 mg/dL — ABNORMAL LOW (ref >40)
Triglycerides: 202 mg/dL — ABNORMAL HIGH (ref <150)
VLDL: 40 mg/dL (ref 0–40)

## 2020-11-03 LAB — CBG MONITORING, ED
Glucose-Capillary: 82 mg/dL (ref 70–99)
Glucose-Capillary: 120 mg/dL — ABNORMAL HIGH (ref 70–99)
Glucose-Capillary: 110 mg/dL — ABNORMAL HIGH (ref 70–99)

## 2020-11-03 LAB — LIPASE, BLOOD: Lipase: 109 U/L — ABNORMAL HIGH (ref 11–51)

## 2020-11-03 MED ORDER — FUROSEMIDE 20 MG PO TABS
20.0000 mg | ORAL_TABLET | Freq: Every day | ORAL | Status: DC
  Administered 2020-11-03: 20 mg via ORAL
  Filled 2020-11-03: qty 1

## 2020-11-03 MED ORDER — POTASSIUM CHLORIDE 20 MEQ PO PACK
40.0000 meq | PACK | Freq: Once | ORAL | Status: AC
  Administered 2020-11-03: 40 meq via ORAL
  Filled 2020-11-03: qty 2

## 2020-11-03 MED ORDER — SPIRONOLACTONE 25 MG PO TABS
50.0000 mg | ORAL_TABLET | Freq: Every day | ORAL | Status: DC
  Administered 2020-11-03: 50 mg via ORAL
  Filled 2020-11-03: qty 2

## 2020-11-03 MED ORDER — INSULIN ASPART 100 UNIT/ML ~~LOC~~ SOLN: 0.0000 [IU] | Freq: Three times a day (TID) | SUBCUTANEOUS | Status: DC

## 2020-11-03 MED ORDER — ALBUMIN HUMAN 25 % IV SOLN: 50.0000 g | Freq: Once | INTRAVENOUS | Status: DC

## 2020-11-03 MED ORDER — GADOBUTROL 1 MMOL/ML IV SOLN
10.0000 mL | Freq: Once | INTRAVENOUS | Status: AC | PRN
  Administered 2020-11-03: 10 mL via INTRAVENOUS

## 2020-11-03 MED ORDER — IBUPROFEN 200 MG PO TABS
400.0000 mg | ORAL_TABLET | Freq: Four times a day (QID) | ORAL | Status: DC | PRN
  Administered 2020-11-03 – 2020-11-04 (×2): 400 mg via ORAL
  Filled 2020-11-03: qty 2
  Filled 2020-11-03 (×2): qty 1

## 2020-11-03 NOTE — H&P (Signed)
Date: 11/03/2020               Patient Name:  Deborah Pham MRN: [id number]  DOB: [birth year] Age / Sex: 55 y.o., female   PCP: [name], MD         Medical Service: Internal Medicine Teaching Service         Attending Physician: Dr. Evette Doffing, Mallie Mussel, *    First Contact: Dr. Wynetta Emery Pager: [phone]  Second Contact: Dr. Court Joy Pager: [phone]       After Hours (After 5p/  First Contact Pager: [phone]  weekends / holidays): Second Contact Pager: [phone]   Chief Complaint: Weight gain, abdominal bloating  History of Present Illness: This is a 55 year old Spanish-speaking female with a history of diabetes, obesity, cirrhosis secondary to [place] with ascites and varices who is presenting for abnormal lab results, weight gain, and abdominal bloating.  She states that for the past week she has noted an increase in abdominal bloating, lower extremity swelling, weight gain, decreased appetite, and early satiety.  Patient reports that her legs have been swelling and feet since Thursday. Also reports decreased appetite due to abdominal fullness, bloating.  She also endorses a 6 pound weight gain over the past week as well as headaches.  She denies any n/v, f/c,cp, sob, night sweats, or other symptoms.  She has not started her increased dose of spironolactone on Lasix yet, feels like her urine has been less.   She saw her gastroenterologist on 3/8 and had labs drawn that showed sodium 126, total bilirubin 7.8, alk phos 400, AST 135, ALT 53, albumin 2.4, AFP 2000, and platelets 63.  Patient was contacted yesterday in regards to these labs and given the significant elevation she was advised to come into the ER to be further treated and evaluated.  In the ED patient was noted to be afebrile, vital signs are stable.  Her labs are significant for sodium of 125, potassium 3.5, creatinine 0.92, glucose 128.  WBC 6.1, hemoglobin 11.9, platelets 54.  Alk phos 431, albumin 1.9, lipase 109,  AST 09/02/2023, ALT 60, T bili 10.7, indirect bilirubin 3.6 and direct bilirubin was 4.1.  GI was consulted who will come and see her.  IMTS was consulted for admission.  Meds:  Current Meds  Medication Sig  . carvedilol (COREG) 3.125 MG tablet Take 1 tablet (3.125 mg total) by mouth 2 (two) times daily with a meal.  . furosemide (LASIX) 20 MG tablet Take 1 tablet (20 mg total) by mouth daily.  Marland Kitchen glipiZIDE (GLUCOTROL) 10 MG tablet Take 1 tablet (10 mg total) by mouth daily before breakfast.  . glucose blood (CONTOUR NEXT TEST) test strip Use as instructed  . spironolactone (ALDACTONE) 50 MG tablet Take 1 tablet (50 mg total) by mouth daily. IM program    Allergies: Allergies as of 11/03/2020 - Review Complete 11/03/2020  Allergen Reaction Noted  . Lisinopril Cough 08/07/2013   Past Medical History:  Diagnosis Date  . Diabetes mellitus without complication ([place])   . Fatty liver   . Gastric varices   . NASH (nonalcoholic steatohepatitis)   . Obesity     Family History:  Family History  Problem Relation Age of Onset  . Liver disease Father   . Alcoholism Father   . Diabetes Mother   . Colon cancer Neg Hx   . Breast cancer Neg Hx   . Stomach cancer Neg Hx   . Throat cancer Neg Hx   .  Pancreatic cancer Neg Hx      Social History: Denies smoking, EtOH, or recreatoinal drugs. Lives with husband. Currently works at [employer], makes boxes, standing for long periods of times. From [place], moved in 1999.   Review of Systems: A complete ROS was negative except as per HPI.   Physical Exam: Blood pressure 116/67, pulse 87, temperature 97.9 F (36.6 C), temperature source Oral, resp. rate (!) 26, height 4' 11"  (1.499 m), weight 103.4 kg, SpO2 99 %. Physical Exam Constitutional:      General: She is not in acute distress.    Appearance: She is obese. She is not toxic-appearing.  HENT:     Head: Normocephalic and atraumatic.     Mouth/Throat:     Mouth: Mucous membranes are  moist.  Cardiovascular:     Rate and Rhythm: Normal rate and regular rhythm.     Heart sounds: Murmur (Systolic murmur) heard.      Comments: 3+ BL LE swelling, up to thighs Pulmonary:     Effort: Pulmonary effort is normal.     Breath sounds: Normal breath sounds.  Abdominal:     General: Bowel sounds are normal. There is distension. There is no abdominal bruit.     Palpations: Abdomen is soft. There is hepatomegaly.     Tenderness: There is no abdominal tenderness.     Hernia: No hernia is present.  Skin:    General: Skin is warm and dry.     Capillary Refill: Capillary refill takes less than 2 seconds.  Neurological:     General: No focal deficit present.     Mental Status: She is alert and oriented to person, place, and time.  Psychiatric:        Mood and Affect: Mood normal.        Behavior: Behavior normal.     Assessment & Plan by Problem: Active Problems:   Decompensated hepatic cirrhosis (HCC)   Hyponatremia  Moderate hyponatremia: Sodium 125, appears significantly volume overloaded on exam.  Sodium 2 days ago was 125, 4 months ago 133.  Patient is currently awake, denies any headaches, nausea, weakness, fatigue, or lethargy.  Currently is asymptomatic.  Likely secondary to her hypervolemic hyponatremia from her worsening cirrhosis.  GI resuming home diuretics.  -Trend BMP every 4 hours -Strict I's and O's -Hold home beta-blocker -Consider 3% saline infusion if worsens  Cirrhosis secondary to NASH: Elevated AFP: Patient presenting with weight gain, abdominal distension, lower extremity swelling, decreased appetite. She is HDS. She appears jaundiced and significantly volume overloaded on exam, with significant abdominal distention.  She was noted to have elevated bilirubin, AST and ALT, elevated AFP, and a platelet count of 54.  She had been asked to come into the hospital for further evaluation for concern of decompensation of her cirrhosis and concern for  hepatocellular carcinoma.  Patient last had a CT abdomen pelvis in December 2021 that showed cirrhosis, portal hypertension, no evidence of hepatocellular carcinoma, and a 9 mm pancreatic lesion.  -GI consulted, appreciate recommendations -MR angio abdomen/pelvis  -Resumed on her home Lasix and Aldactone  -Strict I's and O's  -Daily weights -Consider paracentesis if ascites noted on CT  Diabetes mellitus: Patient is on glipizide at home.  Her last A1c was 8.6.  Glucose today is 109.    -Frequent CBGs -Sliding scale insulin  Thrombocytopenia: Platelets down to 54.  Denies any rash or bleeding symptoms.  Likely secondary to her cirrhosis.  Has a history of esophageal  varices noted on prior EGD. -SCDs for VTE prophylaxis -Daily CBC  Dispo: Admit patient to Inpatient with expected length of stay greater than 2 midnights.  Signed: Asencion Noble, MD 11/03/2020, 12:33 PM  Pager: [phone] After 5pm on weekdays and 1pm on weekends: On Call pager: [phone]

## 2020-11-03 NOTE — ED Notes (Signed)
Patient transported to MRI 

## 2020-11-03 NOTE — Hospital Course (Signed)
Admitted 11/03/2020  Allergies: Lisinopril Pertinent Hx: Diabetes, cirrhosis secondary to NASH, obesity  55 y.o. female p/w abdominal distention, weight gain, extremity swelling  *Hyponatremia: Sodium down to 125 on admission, likely secondary to hypervolemia hyponatremia and her known cirrhosis.  Patient has resumed on home diuretics.  Monitoring for now  *Cirrhosis: Noted to have worsening bilirubin, transaminitis coagulopathy, and significantly elevated AFP.  Concerning for decompensation of cirrhosis, and concern for hepatocellular carcinoma.  MRI angiogram abdomen pelvis pending.  Can consider large-volume paracentesis if significant ascites.  Consults: GI Meds: Lasix, spironolactone, VTE ppx: SCDs IVF: None diet: 2g Na

## 2020-11-03 NOTE — ED Notes (Signed)
Patient transported to CT 

## 2020-11-03 NOTE — Consult Note (Addendum)
Consultation  Referring Provider:  [facility]/ Damita Dunnings Primary Care Physician:  [name], MD Primary Gastroenterologist:  Dr.Danis  Reason for Consultation: Decompensated cirrhosis with hyponatremia, worsening ascites and markedly elevated AFP  HPI: Deborah Pham is a 55 y.o. female, known to Dr. Loletha Carrow, who was seen in our office on 11/01/2020 by Ellouise Newer, PA-C.  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  She had presented with complaints of worsening abdominal distention and some peripheral edema over the past couple of weeks. She had been compliant with her diuretics Aldactone 50 mg daily Lasix 20 mg p.o. every morning. She last had CT imaging on 08/05/2020 which showed a cirrhotic liver with portal hypertension and no evidence of HCC, she does have cholelithiasis and had a 9 mm lesion in the pancreatic body.  Diuretics were increased and labs were obtained on 11/01/2020.  These returned showing sodium 126/BUN 25/creatinine 1.08. T bili 7.8/alk phos 400/AST 135/ALT 53 albumin of 2.4 INR 1.4 and AFP of 2033.  Due to the abnormal labs and patient calling back to the office complaining of the increased abdominal distention she was directed to the emergency room for admission.  On further discussion with the patient, she says that she noticed onset of increased abdominal girth about 1 week ago and also developed increased edema in her lower extremities at that time.  Prior to that she felt like she was pretty stable over the past several months.  Her weight is about 18 pounds above her baseline.  She says her appetite has been somewhat decreased as she feels full and uncomfortable in her abdomen.  She had not noticed that she was jaundiced but on further questioning says that her urine has been quite dark in the morning but seem to lighten up during the day. She has not had any nausea or vomiting, no fever or chills, no melena or  hematochezia.  Today's labs, no significant change sodium 125 INR 1.4  M ELD = 18    Past Medical History:  Diagnosis Date  . Diabetes mellitus without complication ([place])   . Fatty liver   . Gastric varices   . NASH (nonalcoholic steatohepatitis)   . Obesity     Past Surgical History:  Procedure Laterality Date  . ESOPHAGOGASTRODUODENOSCOPY      Prior to Admission medications   Medication Sig Start Date End Date Taking? Authorizing Provider  carvedilol (COREG) 3.125 MG tablet Take 1 tablet (3.125 mg total) by mouth 2 (two) times daily with a meal. 07/19/20  Yes Danis, Estill Cotta III, MD  furosemide (LASIX) 20 MG tablet Take 1 tablet (20 mg total) by mouth daily. 09/26/20 03/25/21 Yes Lacinda Axon, MD  glipiZIDE (GLUCOTROL) 10 MG tablet Take 1 tablet (10 mg total) by mouth daily before breakfast. 09/26/20 03/25/21 Yes Amponsah, Charisse March, MD  glucose blood (CONTOUR NEXT TEST) test strip Use as instructed 01/14/20  Yes [name], MD  spironolactone (ALDACTONE) 50 MG tablet Take 1 tablet (50 mg total) by mouth daily. IM program 09/26/20 03/25/21 Yes Lacinda Axon, MD    Current Facility-Administered Medications  Medication Dose Route Frequency Provider Last Rate Last Admin  . ibuprofen (ADVIL) tablet 400 mg  400 mg Oral Q6H PRN Asencion Noble, MD      . insulin aspart (novoLOG) injection 0-15 Units  0-15 Units Subcutaneous TID WC Asencion Noble, MD       Current Outpatient Medications  Medication Sig  Dispense Refill  . carvedilol (COREG) 3.125 MG tablet Take 1 tablet (3.125 mg total) by mouth 2 (two) times daily with a meal. 180 tablet 2  . furosemide (LASIX) 20 MG tablet Take 1 tablet (20 mg total) by mouth daily. 90 tablet 1  . glipiZIDE (GLUCOTROL) 10 MG tablet Take 1 tablet (10 mg total) by mouth daily before breakfast. 90 tablet 1  . glucose blood (CONTOUR NEXT TEST) test strip Use as instructed 100 each 12  . spironolactone (ALDACTONE) 50 MG tablet Take  1 tablet (50 mg total) by mouth daily. IM program 90 tablet 1    Allergies as of 11/03/2020 - Review Complete 11/03/2020  Allergen Reaction Noted  . Lisinopril Cough 08/07/2013    Family History  Problem Relation Age of Onset  . Liver disease Father   . Alcoholism Father   . Diabetes Mother   . Colon cancer Neg Hx   . Breast cancer Neg Hx   . Stomach cancer Neg Hx   . Throat cancer Neg Hx   . Pancreatic cancer Neg Hx     Social History   Socioeconomic History  . Marital status: Married    Spouse name: Not on file  . Number of children: 2  . Years of education: Not on file  . Highest education level: 12th grade  Occupational History  . Occupation: housewife  Tobacco Use  . Smoking status: Never Smoker  . Smokeless tobacco: Never Used  Vaping Use  . Vaping Use: Never used  Substance and Sexual Activity  . Alcohol use: No  . Drug use: No  . Sexual activity: Yes    Partners: Male    Birth control/protection: Post-menopausal  Other Topics Concern  . Not on file  Social History Narrative  . Not on file   Social Determinants of Health   Financial Resource Strain: Not on file  Food Insecurity: Not on file  Transportation Needs: No Transportation Needs  . Lack of Transportation (Medical): No  . Lack of Transportation (Non-Medical): No  Physical Activity: Not on file  Stress: Not on file  Social Connections: Not on file  Intimate Partner Violence: Not on file    Review of Systems: Pertinent positive and negative review of systems were noted in the above HPI section.  All other review of systems was otherwise negative. Physical Exam: Vital signs in last 24 hours: Temp:  [97.9 F (36.6 C)] 97.9 F (36.6 C) (03/10 0729) Pulse Rate:  [84-96] 90 (03/10 0930) Resp:  [18] 18 (03/10 0930) BP: (116-133)/(57-82) 116/71 (03/10 0930) SpO2:  [96 %-100 %] 100 % (03/10 0930) Weight:  [103.4 kg] 103.4 kg (03/10 0734)   General:   Alert,  Well-developed, obese Hispanic  female, pleasant and cooperative in NAD.  Husband at bedside Head:  Normocephalic and atraumatic. Eyes:  Sclera are anicteric   conjunctiva pink. Ears:  Normal auditory acuity. Nose:  No deformity, discharge,  or lesions. Mouth:  No deformity or lesions.   Neck:  Supple; no masses or thyromegaly. Lungs:  Clear throughout to auscultation.   No wheezes, crackles, or rhonchi. Heart:  Regular rate and rhythm; no murmurs, clicks, rubs,  or gallops. Abdomen: Obese, no tense ascites, no focal tenderness, bowel sounds are present, spleen tip is palpable in the left upper quadrant Rectal: Not done Msk:  Symmetrical without gross deformities. . Pulses:  Normal pulses noted. Extremities: 1+ bilateral lower extremity edema to the knees. Neurologic:  Alert and  oriented x4;  grossly  normal neurologically.  Positive early asterixis Skin:  Intact without significant lesions or rashes.. Psych:  Alert and cooperative. Normal mood and affect.  Intake/Output from previous day: No intake/output data recorded. Intake/Output this shift: No intake/output data recorded.  Lab Results: Recent Labs    11/01/20 1440 11/03/20 0740  WBC 4.1 6.1  HGB 12.2 11.8*  HCT 34.0* 33.8*  PLT 63.0* 54*   BMET Recent Labs    11/01/20 1440 11/03/20 0740  NA 126* 125*  K 3.5 3.7  CL 89* 90*  CO2 27 25  GLUCOSE 178* 128*  BUN 25* 17  CREATININE 1.08 0.92  CALCIUM 8.8 9.1   LFT Recent Labs    11/03/20 0740  PROT 5.8*  ALBUMIN 1.9*  AST 175*  ALT 60*  ALKPHOS 431*  BILITOT 7.7*  BILIDIR 4.1*  IBILI 3.6*   PT/INR Recent Labs    11/03/20 0901  LABPROT 16.8*  INR 1.4*   Hepatitis Panel No results for input(s): HEPBSAG, HCVAB, HEPAIGM, HEPBIGM in the last 72 hours.   IMPRESSION:  #30 55 year old Hispanic female, non-English-speaking with known NASH cirrhosis presenting now with 1 week history of peripheral edema and increased abdominal girth and 18 pound weight gain over the past couple of  weeks. Work-up with labs done through our office on 11/01/2020 show increase in bilirubin and transaminases, new hyponatremia, worsening coagulopathy and a markedly elevated AFP. Picture most concerning for further decompensation of known Karlene Lineman cirrhosis, and probable development of hepatocellular carcinoma  Though she has had weight gain she does not have tense ascites  #2 obesity #3.  Adult onset diabetes mellitus #4 coagulopathy secondary to above #5.  Thrombocytopenia secondary to above #6.  History of esophageal varices-last EGD 2018 with grade 2 varices  PLAN: Have changed previously ordered CT angio to MRI of the abdomen pelvis today as may be more definitive for diagnosis of hepatocellular CA.  If she has a significant amount of ascites on MRI can schedule for large-volume paracentesis.  If paracentesis is scheduled please get cell counts, and cytology  Restart Lasix 20 mg p.o. every morning and Aldactone 50 mg p.o. every morning  2 g sodium diet No need for fluid restriction at this time Daily weights Daily BMET  Will need eventual repeat EGD for surveillance, and consider adding nadolol. GI will follow with you, further recommendations pending results of MRI  Amy EsterwoodPA-C  11/03/2020, 10:47 AM  ________________________________________________________________________  Velora Heckler GI MD note:  I personally examined the patient, reviewed the data and agree with the assessment and plan described above.  MRI does not show any focal liver lesions and only a small amount of ascites. Her AFP is so elevated that I think oncology should at least be curbsided to comment.  I also recommend echocardiogram.  Will follow along.   Owens Loffler, MD [facility] Gastroenterology Pager [phone]

## 2020-11-03 NOTE — ED Notes (Signed)
Pt given pillows and made comfortable.

## 2020-11-03 NOTE — ED Triage Notes (Signed)
Patient presents to ED c/o abd. Swelling and swelling to her lower ext. , states she was seen by her GI and told to come to the ED for further eval.

## 2020-11-03 NOTE — ED Notes (Signed)
Pt returned from MRI °

## 2020-11-03 NOTE — ED Provider Notes (Signed)
[facility] EMERGENCY DEPARTMENT Provider Note   CSN: [id number] Arrival date & time: 11/03/20  0719     History Chief Complaint  Patient presents with  . Abdominal Pain    Deborah Pham is a 55 y.o. female.  Patient with history of diabetes, Karlene Lineman cirrhosis with associated varices and ascites, followed by [facility] gastroenterology --presents the emergency department today for abnormal lab results, worsening abdominal swelling.  Patient recently saw her gastroenterology providers.  It was noted that her bilirubin had increased over the past 3 months 2.4 > 7.8, AST 48 > 135, ALT 43 > 53, Alk Phos 177 > 400, and AFP 219.9 > 2,033.7.  She was sent today for admission over concern for hepatocellular carcinoma.  Lasix was increased recently to 64m daily and spironolactone to 2035mdaily after visit with JeEllouise NewerA-C, Dr. DaLoletha Carrows patient's primary gastroenterologist. Pt reports worsening lower extremity swelling and abdominal swelling since 3/3.  She reports decreased appetite and early satiety over this time.  No chest pain or shortness of breath.  She denies abdominal pain, vomiting, diarrhea.  No urinary symptoms.  She is taking her diuretics but states they do not make her urinate a lot.  She states that she was told to come to the hospital due to her labs being elevated.        Past Medical History:  Diagnosis Date  . Diabetes mellitus without complication ([place]  . Fatty liver   . Gastric varices   . NASH (nonalcoholic steatohepatitis)   . Obesity     Patient Active Problem List   Diagnosis Date Noted  . Meralgia paraesthetica, left 09/26/2020  . Lateral pain of right hip 01/14/2020  . Bilateral lower extremity edema 11/09/2019  . Bilateral Hand pain 10/12/2019  . Screening breast examination 05/07/2019  . Osteoarthritis 12/01/2017  . Health care maintenance 09/16/2017  . Type 2 diabetes mellitus with complication [id number].  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 Other cirrhosis of liver [place]    Past Surgical History:  Procedure Laterality Date  . ESOPHAGOGASTRODUODENOSCOPY       OB History    Gravida  3   Para      Term      Preterm      AB  1   Living  2     SAB  1   IAB      Ectopic      Multiple      Live Births  2           Family History  Problem Relation Age of Onset  . Liver disease Father   . Alcoholism Father   . Diabetes Mother   . Colon cancer Neg Hx   . Breast cancer Neg Hx   . Stomach cancer Neg Hx   . Throat cancer Neg Hx   . Pancreatic cancer Neg Hx     Social History   Tobacco Use  . Smoking status: Never Smoker  . Smokeless tobacco: Never Used  Vaping Use  . Vaping Use: Never used  Substance Use Topics  . Alcohol use: No  . Drug use: No    Home Medications Prior to Admission medications   Medication Sig Start Date End Date Taking? Authorizing Provider  carvedilol (COREG) 3.125 MG tablet Take 1 tablet (3.125 mg total) by mouth 2 (two) times daily with a meal. 07/19/20   Danis, HeKirke CorinMD  furosemide (LASIX) 20  MG tablet Take 1 tablet (20 mg total) by mouth daily. 09/26/20 03/25/21  Amponsah, Prosper M, MD  glipiZIDE (GLUCOTROL) 10 MG tablet Take 1 tablet (10 mg total) by mouth daily before breakfast. 09/26/20 03/25/21  Amponsah, Prosper M, MD  glucose blood (CONTOUR NEXT TEST) test strip Use as instructed 01/14/20   Winters, Steven, MD  spironolactone (ALDACTONE) 50 MG tablet Take 1 tablet (50 mg total) by mouth daily. IM program 09/26/20 03/25/21  Amponsah, Prosper M, MD    Allergies    Lisinopril  Review of Systems   Review of Systems  Constitutional: Positive for appetite change. Negative for fever.  HENT: Negative for rhinorrhea and sore throat.   Eyes: Negative for redness.  Respiratory: Negative for cough.   Cardiovascular: Positive for leg swelling. Negative for chest pain.  Gastrointestinal: Positive for abdominal distention.  Negative for abdominal pain, diarrhea, nausea and vomiting.  Genitourinary: Negative for dysuria, frequency, hematuria and urgency.  Musculoskeletal: Negative for myalgias.  Skin: Negative for rash.  Neurological: Negative for headaches.    Physical Exam Updated Vital Signs BP (!) 133/57 (BP Location: Left Arm)   Pulse 96   Temp 97.9 F (36.6 C) (Oral)   Resp 18   Ht 4' 11" (1.499 m)   Wt 103.4 kg   SpO2 96%   BMI 46.05 kg/m   Physical Exam Vitals and nursing note reviewed.  Constitutional:      General: She is not in acute distress.    Appearance: She is well-developed.  HENT:     Head: Normocephalic and atraumatic.     Right Ear: External ear normal.     Left Ear: External ear normal.     Nose: Nose normal.  Eyes:     General: Scleral icterus present.     Conjunctiva/sclera: Conjunctivae normal.  Cardiovascular:     Rate and Rhythm: Normal rate and regular rhythm.     Heart sounds: No murmur heard.   Pulmonary:     Effort: No respiratory distress.     Breath sounds: No wheezing, rhonchi or rales.  Abdominal:     General: There is distension.     Palpations: Abdomen is soft. There is fluid wave.     Tenderness: There is no abdominal tenderness. There is no guarding or rebound.  Musculoskeletal:     Cervical back: Normal range of motion and neck supple.     Right lower leg: No edema.     Left lower leg: No edema.  Skin:    General: Skin is warm and dry.     Findings: No rash.  Neurological:     General: No focal deficit present.     Mental Status: She is alert. Mental status is at baseline.     Motor: No weakness.  Psychiatric:        Mood and Affect: Mood normal.     ED Results / Procedures / Treatments   Labs (all labs ordered are listed, but only abnormal results are displayed) Labs Reviewed  CBC - Abnormal; Notable for the following components:      Result Value   RBC 3.46 (*)    Hemoglobin 11.8 (*)    HCT 33.8 (*)    MCH 34.1 (*)     Platelets 54 (*)    nRBC 2.0 (*)    All other components within normal limits  URINALYSIS, ROUTINE W REFLEX MICROSCOPIC - Abnormal; Notable for the following components:   Color, Urine AMBER (*)      APPearance HAZY (*)    Bilirubin Urine SMALL (*)    Ketones, ur 5 (*)    All other components within normal limits  BASIC METABOLIC PANEL - Abnormal; Notable for the following components:   Sodium 125 (*)    Chloride 90 (*)    Glucose, Bld 128 (*)    All other components within normal limits  HEPATIC FUNCTION PANEL - Abnormal; Notable for the following components:   Total Protein 5.8 (*)    Albumin 1.9 (*)    AST 175 (*)    ALT 60 (*)    Alkaline Phosphatase 431 (*)    Total Bilirubin 7.7 (*)    Bilirubin, Direct 4.1 (*)    Indirect Bilirubin 3.6 (*)    All other components within normal limits  LIPASE, BLOOD - Abnormal; Notable for the following components:   Lipase 109 (*)    All other components within normal limits  RESP PANEL BY RT-PCR (FLU A&B, COVID) ARPGX2  PROTIME-INR  I-STAT BETA HCG BLOOD, ED ([facility], [facility], [facility] ONLY)    EKG None  Radiology No results found.  Procedures Procedures   Medications Ordered in ED Medications - No data to display  ED Course  I have reviewed the triage vital signs and the nursing notes.  Pertinent labs & imaging results that were available during my care of the patient were reviewed by me and considered in my medical decision making (see chart for details).  Patient seen and examined. Work-up initiated.  Reviewed recent labs and GI notes.  Patient will need admission to the hospital.  Requested consult from GI.  Vital signs reviewed and are as follows: BP (!) 133/57 (BP Location: Left Arm)   Pulse 96   Temp 97.9 F (36.6 C) (Oral)   Resp 18   Ht 4' 11" (1.499 m)   Wt 103.4 kg   SpO2 96%   BMI 46.05 kg/m   Dr. Silverio Decamp aware of need for consult.   I have spoken with [facility] regarding consult for admission and they will see patient.     MDM Rules/Calculators/A&P                          Admit to hospital.    Final Clinical Impression(s) / ED Diagnoses Final diagnoses:  Hyperbilirubinemia  Ascites due to alcoholic cirrhosis [facility])    Rx / DC Orders ED Discharge Orders    None       Carlisle Cater, PA-C 11/03/20 [id number]    Gareth Morgan, MD 11/04/20 2226

## 2020-11-04 ENCOUNTER — Inpatient Hospital Stay (HOSPITAL_COMMUNITY): Payer: No Typology Code available for payment source

## 2020-11-04 ENCOUNTER — Inpatient Hospital Stay (HOSPITAL_COMMUNITY): Payer: Self-pay

## 2020-11-04 DIAGNOSIS — R601 Generalized edema: Secondary | ICD-10-CM

## 2020-11-04 LAB — CBC
HCT: 30.1 % — ABNORMAL LOW (ref 36.0–46.0)
Hemoglobin: 11.2 g/dL — ABNORMAL LOW (ref 12.0–15.0)
MCH: 35 pg — ABNORMAL HIGH (ref 26.0–34.0)
MCHC: 37.2 g/dL — ABNORMAL HIGH (ref 30.0–36.0)
MCV: 94.1 fL (ref 80.0–100.0)
Platelets: 43 10*3/uL — ABNORMAL LOW (ref 150–400)
RBC: 3.2 MIL/uL — ABNORMAL LOW (ref 3.87–5.11)
RDW: 14.7 % (ref 11.5–15.5)
WBC: 5.2 10*3/uL (ref 4.0–10.5)
nRBC: 1.6 % — ABNORMAL HIGH (ref 0.0–0.2)

## 2020-11-04 LAB — BASIC METABOLIC PANEL
Anion gap: 10 (ref 5–15)
Anion gap: 10 (ref 5–15)
BUN: 19 mg/dL (ref 6–20)
BUN: 20 mg/dL (ref 6–20)
CO2: 25 mmol/L (ref 22–32)
CO2: 25 mmol/L (ref 22–32)
Calcium: 8.7 mg/dL — ABNORMAL LOW (ref 8.9–10.3)
Calcium: 8.8 mg/dL — ABNORMAL LOW (ref 8.9–10.3)
Chloride: 90 mmol/L — ABNORMAL LOW (ref 98–111)
Chloride: 91 mmol/L — ABNORMAL LOW (ref 98–111)
Creatinine, Ser: 1.04 mg/dL — ABNORMAL HIGH (ref 0.44–1.00)
Creatinine, Ser: 1.06 mg/dL — ABNORMAL HIGH (ref 0.44–1.00)
GFR, Estimated: >60 mL/min (ref >60)
GFR, Estimated: >60 mL/min (ref >60)
Glucose, Bld: 110 mg/dL — ABNORMAL HIGH (ref 70–99)
Glucose, Bld: 91 mg/dL (ref 70–99)
Potassium: 3.6 mmol/L (ref 3.5–5.1)
Potassium: 4.1 mmol/L (ref 3.5–5.1)
Sodium: 125 mmol/L — ABNORMAL LOW (ref 135–145)
Sodium: 126 mmol/L — ABNORMAL LOW (ref 135–145)

## 2020-11-04 LAB — COMPREHENSIVE METABOLIC PANEL
ALT: 55 U/L — ABNORMAL HIGH (ref 0–44)
AST: 165 U/L — ABNORMAL HIGH (ref 15–41)
Albumin: 1.7 g/dL — ABNORMAL LOW (ref 3.5–5.0)
Alkaline Phosphatase: 405 U/L — ABNORMAL HIGH (ref 38–126)
Anion gap: 11 (ref 5–15)
BUN: 19 mg/dL (ref 6–20)
CO2: 23 mmol/L (ref 22–32)
Calcium: 8.9 mg/dL (ref 8.9–10.3)
Chloride: 92 mmol/L — ABNORMAL LOW (ref 98–111)
Creatinine, Ser: 1.04 mg/dL — ABNORMAL HIGH (ref 0.44–1.00)
GFR, Estimated: >60 mL/min (ref >60)
Glucose, Bld: 72 mg/dL (ref 70–99)
Potassium: 4.2 mmol/L (ref 3.5–5.1)
Sodium: 126 mmol/L — ABNORMAL LOW (ref 135–145)
Total Bilirubin: 8.3 mg/dL — ABNORMAL HIGH (ref 0.3–1.2)
Total Protein: 5.4 g/dL — ABNORMAL LOW (ref 6.5–8.1)

## 2020-11-04 LAB — ECHOCARDIOGRAM COMPLETE
AR max vel: 2.38 cm2
AV Area VTI: 2.47 cm2
AV Area mean vel: 2.38 cm2
AV Mean grad: 9 mmHg
AV Peak grad: 18 mmHg
Ao pk vel: 2.12 m/s
Area-P 1/2: 3.08 cm2
Height: 59 in
S' Lateral: 2.4 cm
Weight: 3648 oz

## 2020-11-04 LAB — PROTIME-INR
INR: 1.4 — ABNORMAL HIGH (ref 0.8–1.2)
Prothrombin Time: 16.7 seconds — ABNORMAL HIGH (ref 11.4–15.2)

## 2020-11-04 LAB — GLUCOSE, CAPILLARY
Glucose-Capillary: 55 mg/dL — ABNORMAL LOW (ref 70–99)
Glucose-Capillary: 104 mg/dL — ABNORMAL HIGH (ref 70–99)
Glucose-Capillary: 86 mg/dL (ref 70–99)
Glucose-Capillary: 52 mg/dL — ABNORMAL LOW (ref 70–99)
Glucose-Capillary: 163 mg/dL — ABNORMAL HIGH (ref 70–99)

## 2020-11-04 MED ORDER — SPIRONOLACTONE 25 MG PO TABS: 100.0000 mg | ORAL_TABLET | Freq: Every day | ORAL | Status: DC

## 2020-11-04 MED ORDER — FUROSEMIDE 40 MG PO TABS: 40.0000 mg | ORAL_TABLET | Freq: Two times a day (BID) | ORAL | Status: DC

## 2020-11-04 MED ORDER — DEXTROSE 50 % IV SOLN
1.0000 | Freq: Once | INTRAVENOUS | Status: AC
  Administered 2020-11-04: 50 mL via INTRAVENOUS
  Filled 2020-11-04: qty 50

## 2020-11-04 MED ORDER — FUROSEMIDE 10 MG/ML IJ SOLN
40.0000 mg | Freq: Two times a day (BID) | INTRAMUSCULAR | Status: DC
  Administered 2020-11-04 – 2020-11-05 (×2): 40 mg via INTRAVENOUS
  Filled 2020-11-04 (×2): qty 4

## 2020-11-04 MED ORDER — IOHEXOL 9 MG/ML PO SOLN
ORAL | Status: AC
  Administered 2020-11-04: 500 mL
  Filled 2020-11-04: qty 1000

## 2020-11-04 MED ORDER — IBUPROFEN 200 MG PO TABS
400.0000 mg | ORAL_TABLET | Freq: Four times a day (QID) | ORAL | Status: DC | PRN
  Administered 2020-11-04: 400 mg via ORAL
  Filled 2020-11-04 (×2): qty 2

## 2020-11-04 MED ORDER — SPIRONOLACTONE 25 MG PO TABS
100.0000 mg | ORAL_TABLET | Freq: Every day | ORAL | Status: DC
  Administered 2020-11-04 – 2020-11-05 (×2): 100 mg via ORAL
  Filled 2020-11-04 (×2): qty 4

## 2020-11-04 MED ORDER — FUROSEMIDE 40 MG PO TABS
40.0000 mg | ORAL_TABLET | Freq: Two times a day (BID) | ORAL | Status: DC
  Administered 2020-11-04: 40 mg via ORAL
  Filled 2020-11-04: qty 1

## 2020-11-04 MED ORDER — LIDOCAINE HCL 1 % IJ SOLN
INTRAMUSCULAR | Status: AC
  Filled 2020-11-04: qty 20

## 2020-11-04 MED ORDER — IOHEXOL 300 MG/ML  SOLN
100.0000 mL | Freq: Once | INTRAMUSCULAR | Status: AC | PRN
  Administered 2020-11-04: 100 mL via INTRAVENOUS

## 2020-11-04 NOTE — Progress Notes (Signed)
IR consulted by Nicoletta Ba, PA-C for possible image-guided paracentesis.  Limited abdominal US revealed little to no fluid that could be safely accessed with procedure today. Dr. Pascal Lux at bedside reviewing [place] and agrees. Informed patient that procedure will not occur today. All questions answered and concerns addressed. Will make Amy, PA-C aware.  Tele-interpreter [phone] was used throughout today's interaction.  IR available in future if needed.   Bea Graff Strother Everitt, PA-C 11/04/2020, 10:31 AM

## 2020-11-04 NOTE — Progress Notes (Addendum)
Patient ID: Deborah Pham, female   DOB: Aug 13, 1966, 55 y.o.   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  Recommended MRI at 1 year for follow-up of the pancreatic neck cystic lesion  IR paracentesis-trace ascites - too small for paracentesis.  Patient says she feels okay, questions about her x-rays were answered.  No complaints of abdominal pain or increase in distention, no nausea     Objective   Vital signs in last 24 hours: Temp:  [98 F (36.7 C)-98.6 F (37 C)] 98.6 F (37 C) (03/11 0117) Pulse Rate:  [82-105] 95 (03/11 0117) Resp:  [19-32] 19 (03/11 0117) BP: (91-128)/(57-79) 93/57 (03/11 0117) SpO2:  [94 %-100 %] 100 % (03/11 0117) Last BM Date: 11/03/20 General:  Older   Hispanic female in NAD Heart:  Regular rate and rhythm; no murmurs Lungs: Respirations even and unlabored, lungs CTA bilaterally Abdomen:  Soft, very obese , non tender, Normal bowel sounds. Extremities:  1+ edema LE Neurologic:  Alert and oriented,  grossly normal neurologically. Psych:  Cooperative. Normal mood and affect.  Intake/Output from previous day: No intake/output data recorded. Intake/Output this shift: No intake/output data recorded.  Lab Results: Recent Labs    11/01/20 1440 11/03/20 0740  11/04/20 0235  WBC 4.1 6.1 5.2  HGB 12.2 11.8* 11.2*  HCT 34.0* 33.8* 30.1*  PLT 63.0* 54* 43*   BMET Recent Labs    11/03/20 2244 11/04/20 0034 11/04/20 0235  NA 126* 125* 126*  K 3.6 4.1 4.2  CL 91* 90* 92*  CO2 25 25 23   GLUCOSE 110* 91 72  BUN 20 19 19   CREATININE 1.06* 1.04* 1.04*  CALCIUM 8.7* 8.8* 8.9   LFT Recent Labs    11/03/20 0740 11/04/20 0235  PROT 5.8* 5.4*  ALBUMIN 1.9* 1.7*  AST 175* 165*  ALT 60* 55*  ALKPHOS 431* 405*  BILITOT 7.7* 8.3*  BILIDIR 4.1*  --   IBILI 3.6*  --    PT/INR Recent Labs    11/03/20 0901 11/04/20 0235  LABPROT 16.8* 16.7*  INR 1.4* 1.4*    Studies/Results: MR ABDOMEN W WO CONTRAST  Result Date: 11/03/2020 CLINICAL DATA:  Cirrhosis, worsening abdominal swelling, elevated AFP, rule out hepatoma EXAM: MRI ABDOMEN WITHOUT AND WITH CONTRAST TECHNIQUE: Multiplanar multisequence MR imaging of the abdomen was performed both before and after the administration of intravenous contrast. CONTRAST:  70m GADAVIST GADOBUTROL 1 MMOL/ML IV SOLN COMPARISON:  CT abdomen pelvis, 08/05/2020 FINDINGS: Examination of the abdomen is generally somewhat limited by breath motion artifact throughout. Lower chest: Trace bilateral pleural effusions. Hepatobiliary: Coarse, nodular, cirrhotic morphology of the liver. No focal liver lesion or suspicious arterial contrast enhancement; assessment limited by breath motion artifact. No gallstones, gallbladder wall thickening, or biliary dilatation. Pancreas: Unchanged 9 mm cystic lesion of the anterior pancreatic neck (series 8, image 22). No pancreatic ductal dilatation or surrounding inflammatory changes.  Spleen: Splenomegaly, maximum coronal span 16.9 cm. Adrenals/Urinary Tract: Adrenal glands are unremarkable. Kidneys are normal, without renal calculi, solid lesion, or hydronephrosis. Stomach/Bowel: Stomach is within normal limits. No evidence of bowel wall thickening, distention, or inflammatory changes.  Vascular/Lymphatic: Gastroesophageal and splenorenal varices. Evaluation for patency of the portal vein is substantially limited by breath motion artifact. Unchanged prominent celiac axis, gastrohepatic ligament, and retroperitoneal lymph nodes. Other: No abdominal wall hernia or abnormality. Small volume ascites throughout the abdomen. Anasarca. Musculoskeletal: No acute or significant osseous findings. IMPRESSION: 1. Examination is generally limited by breath motion artifact, in particular assessment of the portal vein and assessment for focal liver lesions are limited. 2. Cirrhotic morphology of the liver. No focal liver lesion or suspicious arterial contrast enhancement identified within the limitations of the exam. 3. Stigmata of portal hypertension including splenomegaly, varices, and small volume ascites. 4. Unchanged 9 mm cystic lesion of the anterior pancreatic neck, nonspecific and may reflect a small IPMN or sequelae of prior pancreatitis. Recommend follow-up pancreatic protocol MRI at 1 year to establish initial 5 years of stability if not otherwise imaged. This recommendation follows ACR white paper recommendations for management of incidental pancreatic cystic lesions. 5. Anasarca and trace bilateral pleural effusions. Electronically Signed   By: Eddie Candle M.D.   On: 11/03/2020 16:13     Assessment / Plan:    #46 55 year old non-English-speaking Hispanic female with decompensated NASH cirrhosis, who was admitted after being seen in the office and having abnormal labs with hyponatremia/sodium 126, markedly elevated AFP greater than 2000 and complaints of increased abdominal girth.  Work-up thus far with MRI, does not confirm hepatocellular carcinoma though by report study was somewhat limited by motion artifact.  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  Plan; Have ordered CT of the abdomen, hepatic protocol Repeat AFP If imaging non confirmative, can have her see oncology for opinion as an outpatient  Agree with plans for diuresis while she is here, would increase Lasix to 40 mg p.o. daily for home and Aldactone 200 mg p.o. daily.  2 g sodium diet She has follow-up office appointment already scheduled with Dr. Loletha Carrow for 11/09/2020 and have asked her to keep that appointment  Hopefully she can be discharged home this weekend    LOS: 1 day   [place] PA-C 11/04/2020, 9:57 AM   ________________________________________________________________________  Velora Heckler GI MD note:  I personally examined the patient, reviewed the data and agree with the assessment and plan described above. 55 yo woman with decompensated NASH cirrhosis (current MELD-Na is 27), morbidly obese.  She does not have encephalopathy and only a very small amount of ascites. Her echo today showed normal LVEF.  Only 1+ pitting edema in legs.  Creatinine is normal so should be able to increase her diuretics; would aim for lasix 47m daily, aldactone 2086mdaily as long as her Na, K, Creatinine tolerate this.  I changed her to a low salt diet, was on carb modified.  Her AFP is very elevated, this can be seen rarely in cirrhosis without underlying liver cancer. Another liver imaging study has been ordered to make sure we are not missing a tumor in her liver.   DaOwens LofflerMD [facility]  Pager [phone]

## 2020-11-04 NOTE — Progress Notes (Signed)
  Echocardiogram 2D Echocardiogram has been performed.  Deborah Pham F 11/04/2020, 12:05 PM

## 2020-11-04 NOTE — Progress Notes (Signed)
PT CBG was 54 V/O was given by Cato Mulligan, MD for [facility] 1 amp and for meal to be order when she returns to unit  Nurse will cont to monitor

## 2020-11-04 NOTE — Plan of Care (Signed)

## 2020-11-04 NOTE — Progress Notes (Signed)
Subjective:   Overnight, no acute events.  This morning, patient reports that she feels well. She states that her swelling in her bilateral lower extremities has improved since admission. She denies abdominal pain or confusion. She is curious about the plan for today and whether or not she will be receiving her morning medications including her glipizide. Her husband asks about her current diuretic regimen. We discussed the plan for further evaluation today and need to increase her medication regimen. Patient and husband have no further questions or concerns.  Objective:  Vital signs in last 24 hours: Vitals:   11/04/20 0000 11/04/20 0035 11/04/20 0117 11/04/20 1230  BP: (!) 112/59  (!) 93/57 135/88  Pulse: 88  95 88  Resp: (!) 23  19 20   Temp:  98 F (36.7 C) 98.6 F (37 C) 98 F (36.7 C)  TempSrc:  Oral  Oral  SpO2: 98%  100% 100%  Weight:      Height:      No intake or output data in the 24 hours ending 11/04/20 1237 Filed Weights   11/03/20 0734  Weight: 103.4 kg   Physical Exam Constitutional:      General: She is not in acute distress.    Appearance: She is obese.  Eyes:     General: Scleral icterus present.     Extraocular Movements: Extraocular movements intact.     Pupils: Pupils are equal, round, and reactive to light.  Pulmonary:     Effort: Pulmonary effort is normal. No respiratory distress.  Abdominal:     General: Abdomen is flat. Bowel sounds are normal.     Palpations: Abdomen is soft. There is no fluid wave.     Tenderness: There is no abdominal tenderness.  Musculoskeletal:     Right lower leg: 2+ Pitting Edema present.     Left lower leg: 2+ Pitting Edema present.  Skin:    General: Skin is warm and dry.     Capillary Refill: Capillary refill takes less than 2 seconds.     Coloration: Skin is jaundiced.  Neurological:     Mental Status: She is alert and oriented to person, place, and time.     Comments: Mild asterixis  Psychiatric:         Mood and Affect: Mood normal.        Behavior: Behavior normal.    Labs in last 24 hours:  CBC Latest Ref Rng & Units 11/04/2020 11/03/2020 11/01/2020  WBC 4.0 - 10.5 K/uL 5.2 6.1 4.1  Hemoglobin 12.0 - 15.0 g/dL 11.2(L) 11.8(L) 12.2  Hematocrit 36.0 - 46.0 % 30.1(L) 33.8(L) 34.0(L)  Platelets 150 - 400 K/uL 43(L) 54(L) 63.0(L)   CMP Latest Ref Rng & Units 11/04/2020 11/04/2020 11/03/2020  Glucose 70 - 99 mg/dL 72 91 110(H)  BUN 6 - 20 mg/dL 19 19 20   Creatinine 0.44 - 1.00 mg/dL 1.04(H) 1.04(H) 1.06(H)  Sodium 135 - 145 mmol/L 126(L) 125(L) 126(L)  Potassium 3.5 - 5.1 mmol/L 4.2 4.1 3.6  Chloride 98 - 111 mmol/L 92(L) 90(L) 91(L)  CO2 22 - 32 mmol/L 23 25 25   Calcium 8.9 - 10.3 mg/dL 8.9 8.8(L) 8.7(L)  Total Protein 6.5 - 8.1 g/dL 5.4(L) - -  Total Bilirubin 0.3 - 1.2 mg/dL 8.3(H) - -  Alkaline Phos 38 - 126 U/L 405(H) - -  AST 15 - 41 U/L 165(H) - -  ALT 0 - 44 U/L 55(H) - -  Albumin - 1.7  AFP tumor marker -  needs to be collected  Imaging in last 24 hours:  MR ABDOMEN W WO CONTRAST Result Date: 11/03/2020 IMPRESSION: 1. Examination is generally limited by breath motion artifact, in particular assessment of the portal vein and assessment for focal liver lesions are limited. 2. Cirrhotic morphology of the liver. No focal liver lesion or suspicious arterial contrast enhancement identified within the limitations of the exam. 3. Stigmata of portal hypertension including splenomegaly, varices, and small volume ascites. 4. Unchanged 9 mm cystic lesion of the anterior pancreatic neck, nonspecific and may reflect a small IPMN or sequelae of prior pancreatitis. Recommend follow-up pancreatic protocol MRI at 1 year to establish initial 5 years of stability if not otherwise imaged. This recommendation follows ACR white paper recommendations for management of incidental pancreatic cystic lesions. 5. Anasarca and trace bilateral pleural effusions. Electronically Signed   By: Eddie Candle M.D.   On:  11/03/2020 16:13   IR ABDOMEN US LIMITED Result Date: 11/04/2020 IMPRESSION: Trace amount of intra-abdominal ascites, too small to allow for safe ultrasound-guided paracentesis given patient's body habitus. No paracentesis attempted. Electronically Signed   By: Sandi Mariscal M.D.   On: 11/04/2020 10:50   CT Abdomen Pelvis W Contrast - ordered Echocardiogram complete - ordered  Assessment/Plan:  Active Problems:   Decompensated hepatic cirrhosis ([place])   Hyponatremia  Deborah Pham is a 55 year old woman with a past medical history significant for hepatic cirrhosis 2/2 NASH, T2DM, and obesity who presented to [facility] on 11/03/2020 for evaluation of weight gain and abdominal bloating in the setting of abnormal labs found to have decompensated hepatic cirrhosis.  #Decompensated hepatic cirrhosis #Elevated alpha feto-protein MELD 27 points with 19.6% 50-monthmortality. Child Class C with life expectancy 1-3 years. Patient with jaundice and significant hypervolemia on physical examination. Patient would benefit from escalation of her home diuretic regimen as planned at her office visit on March 8th. Although patient's alpha-fetoprotein significantly elevated, no focal lesion on MRI to suggest hepatocellular carcinoma. Patient would benefit from further evaluation to rule out hepatocellular carcinoma. Gastroenterology following, greatly appreciate recommendations -CT Abdomen Pelvis W Contrast -Echocardiogram -Increase furosemide from 253mto 4030maily -Increase spironolactone from 36m2m 100mg20mly -Daily CMP and CBC  #Hypotonic hypervolemic hyponatremia Sodium stable at 126 with serum osmolality of 269. Patient continues to exhibit significant fluid overload on examination. Her hyponatremia is moderate and patient is asymptomatic. -Diuresis and sodium restriction -If sodium <120 or becomes symptomatic start fluid restriction -Monitor on daily CMP  #T2DM, chronic Patient's home  regimen includes glipizide 10mg 62my. -Holding home glipizide -Moderate SSI  #VTE ppx: SCDs  Prior to Admission Living Arrangement: Home Anticipated Discharge Location: Home Barriers to Discharge: Continued medical workup Dispo: Anticipated discharge in approximately 1-2 day(s).   JohnsoCato Mulligan/06/2021, 12:37 PM Pager: [phone] 5pm on weekdays and 1pm on weekends: On Call pager [phone]

## 2020-11-05 DIAGNOSIS — C220 Liver cell carcinoma: Secondary | ICD-10-CM

## 2020-11-05 DIAGNOSIS — R772 Abnormality of alphafetoprotein: Secondary | ICD-10-CM

## 2020-11-05 DIAGNOSIS — K7290 Hepatic failure, unspecified without coma: Secondary | ICD-10-CM

## 2020-11-05 DIAGNOSIS — K7460 Unspecified cirrhosis of liver: Principal | ICD-10-CM

## 2020-11-05 LAB — CBC
HCT: 30.7 % — ABNORMAL LOW (ref 36.0–46.0)
Hemoglobin: 10.9 g/dL — ABNORMAL LOW (ref 12.0–15.0)
MCH: 33.7 pg (ref 26.0–34.0)
MCHC: 35.5 g/dL (ref 30.0–36.0)
MCV: 95 fL (ref 80.0–100.0)
Platelets: 45 10*3/uL — ABNORMAL LOW (ref 150–400)
RBC: 3.23 MIL/uL — ABNORMAL LOW (ref 3.87–5.11)
RDW: 14.6 % (ref 11.5–15.5)
WBC: 5.6 10*3/uL (ref 4.0–10.5)
nRBC: 1.3 % — ABNORMAL HIGH (ref 0.0–0.2)

## 2020-11-05 LAB — COMPREHENSIVE METABOLIC PANEL
ALT: 55 U/L — ABNORMAL HIGH (ref 0–44)
AST: 157 U/L — ABNORMAL HIGH (ref 15–41)
Albumin: 1.7 g/dL — ABNORMAL LOW (ref 3.5–5.0)
Alkaline Phosphatase: 406 U/L — ABNORMAL HIGH (ref 38–126)
Anion gap: 10 (ref 5–15)
BUN: 21 mg/dL — ABNORMAL HIGH (ref 6–20)
CO2: 26 mmol/L (ref 22–32)
Calcium: 9.4 mg/dL (ref 8.9–10.3)
Chloride: 89 mmol/L — ABNORMAL LOW (ref 98–111)
Creatinine, Ser: 1.01 mg/dL — ABNORMAL HIGH (ref 0.44–1.00)
GFR, Estimated: >60 mL/min (ref >60)
Glucose, Bld: 82 mg/dL (ref 70–99)
Potassium: 4 mmol/L (ref 3.5–5.1)
Sodium: 125 mmol/L — ABNORMAL LOW (ref 135–145)
Total Bilirubin: 8.4 mg/dL — ABNORMAL HIGH (ref 0.3–1.2)
Total Protein: 5.5 g/dL — ABNORMAL LOW (ref 6.5–8.1)

## 2020-11-05 LAB — AFP TUMOR MARKER: AFP, Serum, Tumor Marker: 2899 ng/mL — ABNORMAL HIGH (ref 0.0–9.2)

## 2020-11-05 LAB — PROTIME-INR
INR: 1.4 — ABNORMAL HIGH (ref 0.8–1.2)
Prothrombin Time: 16.3 seconds — ABNORMAL HIGH (ref 11.4–15.2)

## 2020-11-05 LAB — GLUCOSE, CAPILLARY: Glucose-Capillary: 115 mg/dL — ABNORMAL HIGH (ref 70–99)

## 2020-11-05 MED ORDER — SPIRONOLACTONE 25 MG PO TABS
100.0000 mg | ORAL_TABLET | Freq: Once | ORAL | Status: AC
  Administered 2020-11-05: 100 mg via ORAL

## 2020-11-05 MED ORDER — SPIRONOLACTONE 100 MG PO TABS: 100.0000 mg | ORAL_TABLET | Freq: Every day | ORAL | 0 refills | Status: DC

## 2020-11-05 MED ORDER — ACETAMINOPHEN 325 MG PO TABS
650.0000 mg | ORAL_TABLET | Freq: Four times a day (QID) | ORAL | Status: DC | PRN
  Administered 2020-11-05: 650 mg via ORAL
  Filled 2020-11-05: qty 2

## 2020-11-05 MED ORDER — FUROSEMIDE 80 MG PO TABS: 80.0000 mg | ORAL_TABLET | Freq: Every day | ORAL | 0 refills | Status: AC

## 2020-11-05 MED ORDER — SPIRONOLACTONE 100 MG PO TABS: 200.0000 mg | ORAL_TABLET | Freq: Every day | ORAL | 0 refills | Status: AC

## 2020-11-05 MED ORDER — SPIRONOLACTONE 25 MG PO TABS: 200.0000 mg | ORAL_TABLET | Freq: Every day | ORAL | Status: DC

## 2020-11-05 MED ORDER — FUROSEMIDE 40 MG PO TABS
40.0000 mg | ORAL_TABLET | Freq: Every day | ORAL | Status: DC
  Filled 2020-11-05: qty 1

## 2020-11-05 MED ORDER — FUROSEMIDE 40 MG PO TABS: 40.0000 mg | ORAL_TABLET | Freq: Every day | ORAL | 0 refills | Status: DC

## 2020-11-05 MED ORDER — FUROSEMIDE 80 MG PO TABS: 80.0000 mg | ORAL_TABLET | Freq: Every day | ORAL | Status: DC

## 2020-11-05 NOTE — Discharge Instructions (Signed)
Deborah Pham,  Fue un placer conocerlo durante su reciente hospitalizacin. Fue hospitalizado debido a la progresin de su cirrosis. Le recomendamos que tome los siguientes medicamentos  -Para su espironolactona (Aldactone): Anteriormente le recetaron 50 mg de espironolactona para tomar [employer]. Le recomendamos que aumente este medicamento a una dosis diaria total de 200 mg. Puedes hacerlo tomando cuatro de los comprimidos de 50 mg que ya tienes, todas las [place] que se te acabe. Una vez que se le acaben las tabletas de 50 mg, puede recoger la nueva receta de tabletas de 100 mg en la farmacia para pacientes ambulatorios de [place] su furosemida (Lasix): Anteriormente le recetaron 20 mg de furosemida para tomar [employer]. Le recomendamos que aumente este medicamento a una dosis diaria total de 80 mg. Puedes hacerlo tomando cuatro de los comprimidos de 48m que ya tienes en casa, todas las [place] se te acabe. Una vez que se le acaben las tabletas de 20 mg, puede recoger la nueva receta de tabletas de 80 mg en la farmacia para pacientes ambulatorios de [place] y tomar una tableta todas las maanas.  -Contine tomando carvedilol 3,125 mg dPlatoy glipizida 10 mg al dSun[employer]  Adems, le recomendamos enfticamente que realice un seguimiento cercano con nuestra clnica y gastroenterologa dentro de una semana para analizar y planificar ms a fondo el control continuo de su cirrosis y la preocupacin por un posible cncer de hgado. Si tiene alguna pregunta o inquietud, no dude en comunicarse con nuestra clnica al [phone]  Atentamente, Dr. MPaulla Dolly MD  Ms. CHiraldo  It was a pleasure meeting you during your recent hospitalization. You were hospitalized due to progression of your cirrhosis. We recommend that you take the following medications  -For your spironolactone (Aldactone): You previously  were prescribed 58mspironolactone to take every day. We recommend that you increase this medication to a total daily dose of 20060mYou can do this by taking four of the 68m13mblets that you already have, every morning, until you run out. Once you run out of the 68mg38mlets, you can pickup the new prescription of 100mg 68mets [facility] every [facility].  -For your furosemide (Lasix): You previously were prescribed 20mg f41memide to take every day. We recommend that you increase this medication to a total daily dose of 80mg. Y70man do this by taking four of the 20mg tab85m that you already have at home, every morning, until you run out. Once you run out of the 20mg tabl53m you can pickup the new prescription of 80mg table79mrom [facility] tablet every morning.  -Continue taking your carvedilol 3.125mg twice 67my and glipizide 10mg daily. 57mo, we strongly recommend that you follow-up closely with our clinic and gastroenterology within one week to further discuss and plan for continued management of your cirrhosis and concern for possible cancer of the liver. If you have any questions or concerns, please do not hesitate to contact our clinic at [phone]  Dr. Jeffrie Lofstrom C. JoPaulla Dolly

## 2020-11-05 NOTE — Progress Notes (Signed)
Subjective:   Overnight, no acute events.  Spanish interpreter used via telephone. This morning, patient reports having a mild headache which she attributes to not being able to sleep well overnight due to frequent awakenings. She otherwise states that she feels well and has been eating, drinking and urinating well. We reviewed the results of her recent laboratory work, echocardiogram and CT Abdomen from yesterday. We discussed with the patient that there remains the possibility of cancer, hepatocellular carcinoma, based on her imaging, however we can not report with certainty for or against this diagnosis. We discussed the importance of following up closely with our clinic and gastroenterology for continued management of her cirrhosis as well as further evaluation for her concern of possible cancer.  Objective:  Vital signs in last 24 hours: Vitals:   11/04/20 1230 11/04/20 2109 11/05/20 0549 11/05/20 0758  BP: 135/88 (!) 96/53 120/67 (!) 119/46  Pulse: 88 98 93 (!) 106  Resp: 20 18 16 16   Temp: 98 F (36.7 C) 97.9 F (36.6 C) 98.4 F (36.9 C) 98.4 F (36.9 C)  TempSrc: Oral Oral Oral Oral  SpO2: 100% 94% 99% 99%  Weight:      Height:        Intake/Output Summary (Last 24 hours) at 11/05/2020 0951 Last data filed at 11/04/2020 1930 Gross per 24 hour  Intake 240 ml  Output -  Net 240 ml   Filed Weights   11/03/20 0734  Weight: 103.4 kg   Physical Exam Vitals reviewed.  Constitutional:      General: She is not in acute distress.    Appearance: She is obese.  Eyes:     General: Scleral icterus present.  Pulmonary:     Effort: Pulmonary effort is normal. No respiratory distress.  Abdominal:     General: Abdomen is flat. Bowel sounds are normal. There is no distension.     Palpations: Abdomen is soft. There is no hepatomegaly.     Tenderness: There is no abdominal tenderness.  Musculoskeletal:     Right lower leg: 2+ Pitting Edema present.     Left lower leg: 2+  Pitting Edema present.  Neurological:     General: No focal deficit present.     Mental Status: She is alert and oriented to person, place, and time.  Psychiatric:        Mood and Affect: Mood normal.        Behavior: Behavior normal.    Labs in last 24 hours:  CBC Latest Ref Rng & Units 11/05/2020 11/04/2020 11/03/2020  WBC 4.0 - 10.5 K/uL 5.6 5.2 6.1  Hemoglobin 12.0 - 15.0 g/dL 10.9(L) 11.2(L) 11.8(L)  Hematocrit 36.0 - 46.0 % 30.7(L) 30.1(L) 33.8(L)  Platelets 150 - 400 K/uL 45(L) 43(L) 54(L)   CMP Latest Ref Rng & Units 11/05/2020 11/04/2020 11/04/2020  Glucose 70 - 99 mg/dL 82 72 91  BUN 6 - 20 mg/dL 21(H) 19 19  Creatinine 0.44 - 1.00 mg/dL 1.01(H) 1.04(H) 1.04(H)  Sodium 135 - 145 mmol/L 125(L) 126(L) 125(L)  Potassium 3.5 - 5.1 mmol/L 4.0 4.2 4.1  Chloride 98 - 111 mmol/L 89(L) 92(L) 90(L)  CO2 22 - 32 mmol/L 26 23 25   Calcium 8.9 - 10.3 mg/dL 9.4 8.9 8.8(L)  Total Protein 6.5 - 8.1 g/dL 5.5(L) 5.4(L) -  Total Bilirubin 0.3 - 1.2 mg/dL 8.4(H) 8.3(H) -  Alkaline Phos 38 - 126 U/L 406(H) 405(H) -  AST 15 - 41 U/L 157(H) 165(H) -  ALT 0 -  44 U/L 55(H) 55(H) -  Albumin - 1.7 INR - 1.4, 1.4, 1.4 AFP tumor marker - in process; prior AFP on 11/01/20 was 2,033.7 Glucose - 115, 163, 52, 86, 104  Imaging in last 24 hours:  CT ABDOMEN PELVIS W CONTRAST Result Date: 11/04/2020 IMPRESSION: 1. Based on the prior MRI I think findings are highly suspicious for hepatocellular carcinoma involving the central aspect of segment 5 near the caudate lobe and IVC. Please see above discussion. PET-CT may be helpful for further evaluation and also to evaluate the bone lesions (Musculoskeletal: Scattered indeterminate lucent bone lesions. Could not exclude metastatic disease.) 2. Advanced cirrhotic changes involving the liver with portal venous hypertension, portal venous collaterals, esophageal varices, splenomegaly and ascites.  ECHOCARDIOGRAM COMPLETE Result Date: 11/04/2020 IMPRESSIONS  1.  Left ventricular ejection fraction, by estimation, is 60 to 65%. The left ventricle has normal function. The left ventricle has no regional wall motion abnormalities. There is mild left ventricular hypertrophy. Left ventricular diastolic parameters are consistent with Grade I diastolic dysfunction (impaired relaxation).  2. Right ventricular systolic function is normal. The right ventricular size is normal.  3. Left atrial size was mildly dilated.  4. Right atrial size was mildly dilated.  5. The mitral valve is normal in structure. Trivial mitral valve regurgitation. No evidence of mitral stenosis.  6. The aortic valve is tricuspid. Aortic valve regurgitation is not visualized. No aortic stenosis is present.  7. Increased flow velocities may be secondary to anemia, thyrotoxicosis, hyperdynamic or high flow state.  IR ABDOMEN US LIMITED Result Date: 11/04/2020 IMPRESSION: Trace amount of intra-abdominal ascites, too small to allow for safe ultrasound-guided paracentesis given patient's body habitus. No paracentesis attempted.  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  #Decompensated hepatic cirrhosis #Elevated alpha feto-protein MELD 27 points with 19.6% 55-monthmortality. Child Class C with life expectancy 1-3 years. Patient continues to appear hypervolemia on exam, however her hypoalbuminemia (1.7) is likely significantly contributing to her third spacing of fluids. Patient reports adequate urine output following  escalation of diuretic regimen, however intake/output and weights have not been appropriately recorded since admission. Patient's labs consistent from prior. Following patient's CT Abdomen/Pelvis yesterday, there is new concern from this radiologist's review of patient's MRI that she may have hepatocellular carcinoma with possible metastases to bone. Patient would benefit from further evaluation in the outpatient setting including PET-CT and referral to oncology. -Transition off of IV furosemide to 494mdaily (plan to titrate up as tolerated) -Continue spironolactone 10066maily (plan to titrate up as tolerated) -PET-CT in outpatient setting  #Hypotonic hypervolemic hyponatremia, active Patient continues to exhibit hypotonic hypervolemic hyponatremia which is anticipated to improve with further diuresis. -Furosemide 99m38mily -Spironolactone 100mg83mly -Continue sodium restriction -If sodium <120 or becomes symptomatic start fluid restriction  #T2DM, chronic Patient experienced hypoglycemia to 55 in setting of being NPO while awaiting extensive workup during hospitalization and anticipated paracentesis. Following resumption of meals, patient's blood glucose readings well controlled. -Holding home glipizide while hospitalized -Moderate SSI  #VTE ppx: SCDs while platelets less than 50  Prior to  Admission Living Arrangement: Home Anticipated Discharge Location: Home Barriers to Discharge: None Dispo: Anticipated discharge today  Cato Mulligan, MD 11/05/2020, 9:51 AM Pager: [phone] After 5pm on weekdays and 1pm on weekends: On Call pager [phone]

## 2020-11-05 NOTE — Progress Notes (Addendum)
GASTROENTEROLOGY ROUNDING NOTE   Subjective: Patient was getting discharge instructions this morning when I went in to discuss the CT results. Denies any abdominal pain   Objective: Vital signs in last 24 hours: Temp:  [97.9 F (36.6 C)-98.4 F (36.9 C)] 98.4 F (36.9 C) (03/12 0758) Pulse Rate:  [88-106] 106 (03/12 0758) Resp:  [16-20] 16 (03/12 0758) BP: (96-135)/(46-88) 119/46 (03/12 0758) SpO2:  [94 %-100 %] 99 % (03/12 0758) Last BM Date: 11/03/20 General: NAD  Intake/Output from previous day: 03/11 0701 - 03/12 0700 In: 240 [P.O.:240] Out: -  Intake/Output this shift: No intake/output data recorded.   Lab Results: Recent Labs    11/03/20 0740 11/04/20 0235 11/05/20 0156  WBC 6.1 5.2 5.6  HGB 11.8* 11.2* 10.9*  PLT 54* 43* 45*  MCV 97.7 94.1 95.0   BMET Recent Labs    11/04/20 0034 11/04/20 0235 11/05/20 0156  NA 125* 126* 125*  K 4.1 4.2 4.0  CL 90* 92* 89*  CO2 25 23 26   GLUCOSE 91 72 82  BUN 19 19 21*  CREATININE 1.04* 1.04* 1.01*  CALCIUM 8.8* 8.9 9.4   LFT Recent Labs    11/03/20 0740 11/04/20 0235 11/05/20 0156  PROT 5.8* 5.4* 5.5*  ALBUMIN 1.9* 1.7* 1.7*  AST 175* 165* 157*  ALT 60* 55* 55*  ALKPHOS 431* 405* 406*  BILITOT 7.7* 8.3* 8.4*  BILIDIR 4.1*  --   --   IBILI 3.6*  --   --    PT/INR Recent Labs    11/04/20 0235 11/05/20 0156  INR 1.4* 1.4*      Imaging/Other results: MR ABDOMEN W WO CONTRAST  Result Date: 11/03/2020 CLINICAL DATA:  Cirrhosis, worsening abdominal swelling, elevated AFP, rule out hepatoma EXAM: MRI ABDOMEN WITHOUT AND WITH CONTRAST TECHNIQUE: Multiplanar multisequence MR imaging of the abdomen was performed both before and after the administration of intravenous contrast. CONTRAST:  6m GADAVIST GADOBUTROL 1 MMOL/ML IV SOLN COMPARISON:  CT abdomen pelvis, 08/05/2020 FINDINGS: Examination of the abdomen is generally somewhat limited by breath motion artifact throughout. Lower chest: Trace  bilateral pleural effusions. Hepatobiliary: Coarse, nodular, cirrhotic morphology of the liver. No focal liver lesion or suspicious arterial contrast enhancement; assessment limited by breath motion artifact. No gallstones, gallbladder wall thickening, or biliary dilatation. Pancreas: Unchanged 9 mm cystic lesion of the anterior pancreatic neck (series 8, image 22). No pancreatic ductal dilatation or surrounding inflammatory changes. Spleen: Splenomegaly, maximum coronal span 16.9 cm. Adrenals/Urinary Tract: Adrenal glands are unremarkable. Kidneys are normal, without renal calculi, solid lesion, or hydronephrosis. Stomach/Bowel: Stomach is within normal limits. No evidence of bowel wall thickening, distention, or inflammatory changes. Vascular/Lymphatic: Gastroesophageal and splenorenal varices. Evaluation for patency of the portal vein is substantially limited by breath motion artifact. Unchanged prominent celiac axis, gastrohepatic ligament, and retroperitoneal lymph nodes. Other: No abdominal wall hernia or abnormality. Small volume ascites throughout the abdomen. Anasarca. Musculoskeletal: No acute or significant osseous findings. IMPRESSION: 1. Examination is generally limited by breath motion artifact, in particular assessment of the portal vein and assessment for focal liver lesions are limited. 2. Cirrhotic morphology of the liver. No focal liver lesion or suspicious arterial contrast enhancement identified within the limitations of the exam. 3. Stigmata of portal hypertension including splenomegaly, varices, and small volume ascites. 4. Unchanged 9 mm cystic lesion of the anterior pancreatic neck, nonspecific and may reflect a small IPMN or sequelae of prior pancreatitis. Recommend follow-up pancreatic protocol MRI at 1 year to establish initial  5 years of stability if not otherwise imaged. This recommendation follows ACR white paper recommendations for management of incidental pancreatic cystic lesions.  5. Anasarca and trace bilateral pleural effusions. Electronically Signed   By: Eddie Candle M.D.   On: 11/03/2020 16:13   CT ABDOMEN PELVIS W CONTRAST  Result Date: 11/04/2020 CLINICAL DATA:  Cirrhosis and elevated AFP. Evaluate for possible HCC. EXAM: CT ABDOMEN AND PELVIS WITH CONTRAST TECHNIQUE: Multidetector CT imaging of the abdomen and pelvis was performed using the standard protocol following bolus administration of intravenous contrast. CONTRAST:  132m OMNIPAQUE IOHEXOL 300 MG/ML  SOLN COMPARISON:  MRI 11/03/2020 FINDINGS: Lower chest: The heart is within normal limits in size. No pericardial effusion. Small right pleural effusion with minimal overlying atelectasis. Hepatobiliary: Advanced cirrhotic changes involving the liver with portal venous hypertension, portal venous collaterals, splenomegaly, ascites and esophageal varices. Unfortunately, no early arterial phase sequence was performed. However, when looking at the prior MRI examination I think there is an area of heterogeneous early arterial phase enhancement in the central aspect of segment 5 near the caudate lobe. This is best seen on the subtraction images at 25 seconds (image 23, 24 in 25 of series 20). This measures approximately 4.7 x 4.3 cm and is also diffusion positive and highly suspicious for HCC. Gallbladder wall thickening likely due to the patient's cirrhosis and ascites. Normal caliber and course of the common bile duct. Pancreas: No mass, inflammation or ductal dilatation. Spleen: Stable splenomegaly. No splenic lesions. Large splenorenal shunt noted. Adrenals/Urinary Tract: The adrenal glands and kidneys are unremarkable. No worrisome renal lesions or hydronephrosis. Moderate renal cortical scarring changes involving the left kidney. The bladder is unremarkable. Stomach/Bowel: The stomach, duodenum, small bowel and colon are grossly normal. No acute inflammatory changes, mass lesions or obstructive findings. The terminal ileum is  normal. The appendix is normal. Scattered colonic diverticulosis without findings for acute diverticulitis. Vascular/Lymphatic: The aorta and branch vessels are patent. No atherosclerotic calcifications. The major venous structures are patent. Moderate narrowing of the main portal vein but no thrombus or occlusion. Reproductive: The uterus and ovaries are unremarkable. Other: Moderate volume abdominal/pelvic ascites. There is also mesenteric and omental edema and diffuse body wall edema. Musculoskeletal: Scattered indeterminate lucent bone lesions. Could not exclude metastatic disease. IMPRESSION: 1. Based on the prior MRI I think findings are highly suspicious for hepatocellular carcinoma involving the central aspect of segment 5 near the caudate lobe and IVC. Please see above discussion. PET-CT may be helpful for further evaluation and also to evaluate the bone lesions. 2. Advanced cirrhotic changes involving the liver with portal venous hypertension, portal venous collaterals, esophageal varices, splenomegaly and ascites. Electronically Signed   By: PMarijo SanesM.D.   On: 11/04/2020 20:08   ECHOCARDIOGRAM COMPLETE  Result Date: 11/04/2020    ECHOCARDIOGRAM REPORT   Patient Name:   Deborah Pham Date of Exam: 11/04/2020 Medical Rec #:  [id number]              Height:       59.0 in Accession #:    [id number]             Weight:       228.0 lb Date of Birth:  [birth year]              BSA:          1.950 m Patient Age:    58years  BP:           93/57 mmHg Patient Gender: F                       HR:           89 bpm. Exam Location:  Inpatient Procedure: 2D Echo, Cardiac Doppler and Color Doppler Indications:    Anasarca  History:        Patient has no prior history of Echocardiogram examinations.  Sonographer:    Merrie Roof RDCS Referring Phys: [id number] [place]  1. Left ventricular ejection fraction, by estimation, is 60 to 65%. The left ventricle has normal  function. The left ventricle has no regional wall motion abnormalities. There is mild left ventricular hypertrophy. Left ventricular diastolic parameters are consistent with Grade I diastolic dysfunction (impaired relaxation).  2. Right ventricular systolic function is normal. The right ventricular size is normal.  3. Left atrial size was mildly dilated.  4. Right atrial size was mildly dilated.  5. The mitral valve is normal in structure. Trivial mitral valve regurgitation. No evidence of mitral stenosis.  6. The aortic valve is tricuspid. Aortic valve regurgitation is not visualized. No aortic stenosis is present.  7. Increased flow velocities may be secondary to anemia, thyrotoxicosis, hyperdynamic or high flow state. FINDINGS  Left Ventricle: Left ventricular ejection fraction, by estimation, is 60 to 65%. The left ventricle has normal function. The left ventricle has no regional wall motion abnormalities. The left ventricular internal cavity size was normal in size. There is  mild left ventricular hypertrophy. Left ventricular diastolic parameters are consistent with Grade I diastolic dysfunction (impaired relaxation). Right Ventricle: The right ventricular size is normal. No increase in right ventricular wall thickness. Right ventricular systolic function is normal. Left Atrium: Left atrial size was mildly dilated. Right Atrium: Right atrial size was mildly dilated. Pericardium: There is no evidence of pericardial effusion. Mitral Valve: The mitral valve is normal in structure. Trivial mitral valve regurgitation. No evidence of mitral valve stenosis. Tricuspid Valve: The tricuspid valve is normal in structure. Tricuspid valve regurgitation is not demonstrated. No evidence of tricuspid stenosis. Aortic Valve: The aortic valve is tricuspid. Aortic valve regurgitation is not visualized. No aortic stenosis is present. Aortic valve mean gradient measures 9.0 mmHg. Aortic valve peak gradient measures 18.0 mmHg.  Aortic valve area, by VTI measures 2.47  cm. Pulmonic Valve: The pulmonic valve was normal in structure. Pulmonic valve regurgitation is not visualized. No evidence of pulmonic stenosis. Aorta: The aortic root is normal in size and structure. Venous: The inferior vena cava was not well visualized. IAS/Shunts: The interatrial septum was not well visualized.  LEFT VENTRICLE PLAX 2D LVIDd:         3.50 cm  Diastology LVIDs:         2.40 cm  LV e' medial:    7.07 cm/s LV PW:         1.00 cm  LV E/e' medial:  10.9 LV IVS:        1.00 cm  LV e' lateral:   10.10 cm/s LVOT diam:     1.90 cm  LV E/e' lateral: 7.6 LV SV:         98 LV SV Index:   50 LVOT Area:     2.84 cm  RIGHT VENTRICLE RV Basal diam:  3.70 cm LEFT ATRIUM             Index  RIGHT ATRIUM           Index LA diam:        4.00 cm 2.05 cm/m  RA Area:     18.20 cm LA Vol (A2C):   64.4 ml 33.03 ml/m RA Volume:   55.50 ml  28.46 ml/m LA Vol (A4C):   71.1 ml 36.46 ml/m LA Biplane Vol: 68.7 ml 35.23 ml/m  AORTIC VALVE AV Area (Vmax):    2.38 cm AV Area (Vmean):   2.38 cm AV Area (VTI):     2.47 cm AV Vmax:           212.00 cm/s AV Vmean:          136.000 cm/s AV VTI:            0.398 m AV Peak Grad:      18.0 mmHg AV Mean Grad:      9.0 mmHg LVOT Vmax:         178.00 cm/s LVOT Vmean:        114.000 cm/s LVOT VTI:          0.347 m LVOT/AV VTI ratio: 0.87  AORTA Ao Root diam: 2.70 cm Ao Asc diam:  3.00 cm MITRAL VALVE MV Area (PHT): 3.08 cm     SHUNTS MV Decel Time: 246 msec     Systemic VTI:  0.35 m MV E velocity: 77.10 cm/s   Systemic Diam: 1.90 cm MV A velocity: 101.00 cm/s MV E/A ratio:  0.76 Cherlynn Kaiser MD Electronically signed by Cherlynn Kaiser MD Signature Date/Time: 11/04/2020/2:31:56 PM    Final    IR ABDOMEN US LIMITED  Result Date: 11/04/2020 CLINICAL DATA:  History of cirrhosis now with concern for intra-abdominal ascites. Please perform ascites search ultrasound and ultrasound-guided paracentesis as indicated. EXAM: LIMITED ABDOMEN  ULTRASOUND FOR ASCITES TECHNIQUE: Limited ultrasound survey for ascites was performed in all four abdominal quadrants. COMPARISON:  Abdominal MRI-11/03/2020; CT abdomen and pelvis-08/05/2020 FINDINGS: Sonographic evaluation of the abdomen demonstrates a trace amount of intra-abdominal ascites, too small to allow for safe ultrasound-guided paracentesis given patient's body habitus. No paracentesis attempted. IMPRESSION: Trace amount of intra-abdominal ascites, too small to allow for safe ultrasound-guided paracentesis given patient's body habitus. No paracentesis attempted. Electronically Signed   By: Sandi Mariscal M.D.   On: 11/04/2020 10:50      Assessment &Plan  55 year old female with morbid obesity, decompensated cirrhosis. Meld 18  AFP elevated >2000.  CT liver protocol showed solitary 4 x 4 lesion in segment 5 of liver concerning for hepatocellular carcinoma  She will need referral to IR for management of HCC (TACE or RFA).  She has a follow-up appointment with Dr. Loletha Carrow on November 09, 2020 With recent diagnosis of [facility], she may be a candidate for possible liver transplant.  Will also need referral to liver transplant center  Continue with Lasix 40 mg daily and Aldactone 200 mg daily 2 g sodium diet Avoid alcohol, NSAIDs or high carb diet   This visit required 35  minutes of patient care (this includes precharting, chart review, review of results, face-to-face time used for counseling as well as treatment plan and follow-up. The patient was provided an opportunity to ask questions and all were answered. The patient agreed with the plan and demonstrated an understanding of the instructions.   Damaris Hippo , MD [phone]  [facility]

## 2020-11-05 NOTE — Discharge Summary (Signed)
Name: Deborah Pham MRN: [id number] DOB: 01-Oct-1965 55 y.o. PCP: Deborah Mercury, MD  Date of Admission: 11/03/2020  7:27 AM Date of Discharge: 11/05/2020 Attending Physician: Axel Filler, *  Discharge Diagnosis: 1. Principal Problem:   Decompensated hepatic cirrhosis (HCC) Active Problems:   Type 2 diabetes mellitus with complication (HCC)   Hyponatremia  Discharge Medications: Allergies as of 11/05/2020      Reactions   Lisinopril Cough      Medication List    TAKE these medications   carvedilol 3.125 MG tablet Commonly known as: COREG Take 1 tablet (3.125 mg total) by mouth 2 (two) times daily with a meal.   Contour Next Test test strip Generic drug: glucose blood Use as instructed   furosemide 80 MG tablet Commonly known as: LASIX Take 1 tablet (80 mg total) by mouth daily. Start taking on: November 06, 2020 What changed:   medication strength  how much to take   glipiZIDE 10 MG tablet Commonly known as: GLUCOTROL Take 1 tablet (10 mg total) by mouth daily before breakfast.   spironolactone 100 MG tablet Commonly known as: ALDACTONE Take 2 tablets (200 mg total) by mouth daily. Start taking on: November 06, 2020 What changed:   medication strength  how much to take  additional instructions      Disposition and follow-up:   [name] was discharged from [facility] in Stable condition.  At the hospital follow up visit please address:  1.  Hepatic cirrhosis with concern for possible hepatocellular carcinoma: Patient was hospitalized due to decompensation of her hepatic cirrhosis with MRI findings concerning for possible HCC. Patient had home furosemide 74m daily increased to 866mdaily as well as her home spironolactone increased from 5066maily to 200m72mily during admission. These medications will be available for [facility] Patient instructed to take four  tablets of furosemide 20 and four tablets of spironolactone 50 which she has at her home tomorrow morning. Discussed with patient her possible cancer and the need for continued close follow-up in the outpatient setting for laboratory monitoring and assessment of volume status, as well as further workup for malignancy. Patient was recommended to obtain PET-CT following discharge (this order has not been placed during this admission.) Obtain adherence to medication, volume status and place order for PET-CT. Obtain CMP, CBC and INR.  Hypotonic, hypervolemic hyponatremia: Patient's sodium 125 on day of discharge, anticipated to improve with escalation of her diuretic regimen. Please assess sodium on CMP.  2.  Labs / imaging needed at time of follow-up: CBC, CMP, INR, PET-CT  3.  Pending labs/ test needing follow-up: None  Follow-up Appointments:  Follow-up Information    WintMaudie Pham. Schedule an appointment as soon as possible for a visit in 1 week(s).   Specialty: Internal Medicine Contact information: [address] [phone]        [name]. Schedule an appointment as soon as possible for a visit in 1 week(s).   Specialty: Gastroenterology Contact information: [address] [phone]              Hospital Course by problem list:  #Decompensated hepatic cirrhosis Patient hospitalized for decompensated hepatic cirrhosis. Following admission, patient was started on IV lasix 40mg64mce daily and spironolactone increased from home dose of 50mg 58my to 100mg d46m. Patient reported appropriate output, however  she still appeared hypervolemic on examination, however likely significantly affected by her hypoalbuminemia with an albumin of 1.7. Patient's laboratory evaluation remained stable throughout hospitalization with low platelets, hyperbilirubinemia, transaminitis, and elevated INR. Prior to  discharge, patient increased to spironolactone 269m daily and furosemide 86mdaily to continue in the outpatient setting. Patient has supply of medication at home (furosemide 207mablets and spironolactone 79m51mblets). Instructed patient to take four tablets of each medication tomorrow morning then pickup new prescriptions of furosemide 80mg11m spironolactone 200mg 6mugh [facility].   #Concern for hepatocellular carcinoma Patient presented following alpha fetoprotein >2000 in the outpatient setting. Patient had repeat alpha fetoprotein which was more elevated 2,899 from 2,033.7. During hospitalization, patient underwent MRI of the Abdomen which was initially unrevealing for malignancy however follow-up CT Abdomen/Pelvis raised concern for hepatocellular carcinoma with metastases to the bone. Discussed findings with patient who desires to pursue further workup with PET-CT in the outpatient setting.   #Hypotonic hypervolemic hyponatremia, active Patient presented with hyponatremia with sodium of 125-126. Patient's sodium stable throughout hospitalization and patient asymptomatic. Patient expected to have improvement in hyponatremia following discharge with diuresis and salt restriction.  #T2DM, chronic Patient experienced hypoglycemia to 55 in setting of being NPO while awaiting extensive workup during hospitalization and anticipated paracentesis. Following resumption of meals, patient's blood glucose readings well controlled.  #VTE ppx: Patient kept on SCDs throughout hospitalization for platelets less than 50  Pertinent Labs, Studies, and Procedures:  CBC Latest Ref Rng & Units 11/05/2020 11/04/2020 11/03/2020  WBC 4.0 - 10.5 K/uL 5.6 5.2 6.1  Hemoglobin 12.0 - 15.0 g/dL 10.9(L) 11.2(L) 11.8(L)  Hematocrit 36.0 - 46.0 % 30.7(L) 30.1(L) 33.8(L)  Platelets 150 - 400 K/uL 45(L) 43(L) 54(L)   CMP Latest Ref Rng & Units 11/05/2020 11/04/2020 11/04/2020   Glucose 70 - 99 mg/dL 82 72 91  BUN 6 - 20 mg/dL 21(H) 19 19  Creatinine 0.44 - 1.00 mg/dL 1.01(H) 1.04(H) 1.04(H)  Sodium 135 - 145 mmol/L 125(L) 126(L) 125(L)  Potassium 3.5 - 5.1 mmol/L 4.0 4.2 4.1  Chloride 98 - 111 mmol/L 89(L) 92(L) 90(L)  CO2 22 - 32 mmol/L 26 23 25   Calcium 8.9 - 10.3 mg/dL 9.4 8.9 8.8(L)  Total Protein 6.5 - 8.1 g/dL 5.5(L) 5.4(L) -  Total Bilirubin 0.3 - 1.2 mg/dL 8.4(H) 8.3(H) -  Alkaline Phos 38 - 126 U/L 406(H) 405(H) -  AST 15 - 41 U/L 157(H) 165(H) -  ALT 0 - 44 U/L 55(H) 55(H) -  MR ABDOMEN W WO CONTRAST  Result Date: 11/03/2020 CLINICAL DATA:  Cirrhosis, worsening abdominal swelling, elevated AFP, rule out hepatoma EXAM: MRI ABDOMEN WITHOUT AND WITH CONTRAST TECHNIQUE: Multiplanar multisequence MR imaging of the abdomen was performed both before and after the administration of intravenous contrast. CONTRAST:  10mL G49mIST GADOBUTROL 1 MMOL/ML IV SOLN COMPARISON:  CT abdomen pelvis, 08/05/2020 FINDINGS: Examination of the abdomen is generally somewhat limited by breath motion artifact throughout. Lower chest: Trace bilateral pleural effusions. Hepatobiliary: Coarse, nodular, cirrhotic morphology of the liver. No focal liver lesion or suspicious arterial contrast enhancement; assessment limited by breath motion artifact. No gallstones, gallbladder wall thickening, or biliary dilatation. Pancreas: Unchanged 9 mm cystic lesion of the anterior pancreatic neck (series 8, image 22). No pancreatic ductal dilatation or surrounding inflammatory changes. Spleen: Splenomegaly, maximum coronal span 16.9 cm. Adrenals/Urinary Tract: Adrenal glands are unremarkable. Kidneys are normal, without renal calculi, solid lesion, or hydronephrosis. Stomach/Bowel: Stomach is within normal  limits. No evidence of bowel wall thickening, distention, or inflammatory changes. Vascular/Lymphatic: Gastroesophageal and splenorenal varices. Evaluation for patency of the portal vein is substantially  limited by breath motion artifact. Unchanged prominent celiac axis, gastrohepatic ligament, and retroperitoneal lymph nodes. Other: No abdominal wall hernia or abnormality. Small volume ascites throughout the abdomen. Anasarca. Musculoskeletal: No acute or significant osseous findings. IMPRESSION: 1. Examination is generally limited by breath motion artifact, in particular assessment of the portal vein and assessment for focal liver lesions are limited. 2. Cirrhotic morphology of the liver. No focal liver lesion or suspicious arterial contrast enhancement identified within the limitations of the exam. 3. Stigmata of portal hypertension including splenomegaly, varices, and small volume ascites. 4. Unchanged 9 mm cystic lesion of the anterior pancreatic neck, nonspecific and may reflect a small IPMN or sequelae of prior pancreatitis. Recommend follow-up pancreatic protocol MRI at 1 year to establish initial 5 years of stability if not otherwise imaged. This recommendation follows ACR white paper recommendations for management of incidental pancreatic cystic lesions. 5. Anasarca and trace bilateral pleural effusions. Electronically Signed   By: Eddie Candle M.D.   On: 11/03/2020 16:13   CT ABDOMEN PELVIS W CONTRAST  Result Date: 11/04/2020 CLINICAL DATA:  Cirrhosis and elevated AFP. Evaluate for possible HCC. EXAM: CT ABDOMEN AND PELVIS WITH CONTRAST TECHNIQUE: Multidetector CT imaging of the abdomen and pelvis was performed using the standard protocol following bolus administration of intravenous contrast. CONTRAST:  137m OMNIPAQUE IOHEXOL 300 MG/ML  SOLN COMPARISON:  MRI 11/03/2020 FINDINGS: Lower chest: The heart is within normal limits in size. No pericardial effusion. Small right pleural effusion with minimal overlying atelectasis. Hepatobiliary: Advanced cirrhotic changes involving the liver with portal venous hypertension, portal venous collaterals, splenomegaly, ascites and esophageal varices.  Unfortunately, no early arterial phase sequence was performed. However, when looking at the prior MRI examination I think there is an area of heterogeneous early arterial phase enhancement in the central aspect of segment 5 near the caudate lobe. This is best seen on the subtraction images at 25 seconds (image 23, 24 in 25 of series 20). This measures approximately 4.7 x 4.3 cm and is also diffusion positive and highly suspicious for HCC. Gallbladder wall thickening likely due to the patient's cirrhosis and ascites. Normal caliber and course of the common bile duct. Pancreas: No mass, inflammation or ductal dilatation. Spleen: Stable splenomegaly. No splenic lesions. Large splenorenal shunt noted. Adrenals/Urinary Tract: The adrenal glands and kidneys are unremarkable. No worrisome renal lesions or hydronephrosis. Moderate renal cortical scarring changes involving the left kidney. The bladder is unremarkable. Stomach/Bowel: The stomach, duodenum, small bowel and colon are grossly normal. No acute inflammatory changes, mass lesions or obstructive findings. The terminal ileum is normal. The appendix is normal. Scattered colonic diverticulosis without findings for acute diverticulitis. Vascular/Lymphatic: The aorta and branch vessels are patent. No atherosclerotic calcifications. The major venous structures are patent. Moderate narrowing of the main portal vein but no thrombus or occlusion. Reproductive: The uterus and ovaries are unremarkable. Other: Moderate volume abdominal/pelvic ascites. There is also mesenteric and omental edema and diffuse body wall edema. Musculoskeletal: Scattered indeterminate lucent bone lesions. Could not exclude metastatic disease. IMPRESSION: 1. Based on the prior MRI I think findings are highly suspicious for hepatocellular carcinoma involving the central aspect of segment 5 near the caudate lobe and IVC. Please see above discussion. PET-CT may be helpful for further evaluation and also  to evaluate the bone lesions. 2. Advanced cirrhotic changes involving the liver with  portal venous hypertension, portal venous collaterals, esophageal varices, splenomegaly and ascites. Electronically Signed   By: Marijo Sanes M.D.   On: 11/04/2020 20:08   ECHOCARDIOGRAM COMPLETE  Result Date: 11/04/2020    ECHOCARDIOGRAM REPORT   Patient Name:   Deborah FLORES Maciolek Date of Exam: 11/04/2020 Medical Rec #:  [id number]               Height:       59.0 in Accession #:    [id number]              Weight:       228.0 lb Date of Birth:  16-May-1966               BSA:          1.950 m Patient Age:    77 years                BP:           93/57 mmHg Patient Gender: F                       HR:           89 bpm. Exam Location:  Inpatient Procedure: 2D Echo, Cardiac Doppler and Color Doppler Indications:    Anasarca  History:        Patient has no prior history of Echocardiogram examinations.  Sonographer:    Merrie Roof RDCS Referring Phys: [id number] Angie  1. Left ventricular ejection fraction, by estimation, is 60 to 65%. The left ventricle has normal function. The left ventricle has no regional wall motion abnormalities. There is mild left ventricular hypertrophy. Left ventricular diastolic parameters are consistent with Grade I diastolic dysfunction (impaired relaxation).  2. Right ventricular systolic function is normal. The right ventricular size is normal.  3. Left atrial size was mildly dilated.  4. Right atrial size was mildly dilated.  5. The mitral valve is normal in structure. Trivial mitral valve regurgitation. No evidence of mitral stenosis.  6. The aortic valve is tricuspid. Aortic valve regurgitation is not visualized. No aortic stenosis is present.  7. Increased flow velocities may be secondary to anemia, thyrotoxicosis, hyperdynamic or high flow state. FINDINGS  Left Ventricle: Left ventricular ejection fraction, by estimation, is 60 to 65%. The left ventricle has normal  function. The left ventricle has no regional wall motion abnormalities. The left ventricular internal cavity size was normal in size. There is  mild left ventricular hypertrophy. Left ventricular diastolic parameters are consistent with Grade I diastolic dysfunction (impaired relaxation). Right Ventricle: The right ventricular size is normal. No increase in right ventricular wall thickness. Right ventricular systolic function is normal. Left Atrium: Left atrial size was mildly dilated. Right Atrium: Right atrial size was mildly dilated. Pericardium: There is no evidence of pericardial effusion. Mitral Valve: The mitral valve is normal in structure. Trivial mitral valve regurgitation. No evidence of mitral valve stenosis. Tricuspid Valve: The tricuspid valve is normal in structure. Tricuspid valve regurgitation is not demonstrated. No evidence of tricuspid stenosis. Aortic Valve: The aortic valve is tricuspid. Aortic valve regurgitation is not visualized. No aortic stenosis is present. Aortic valve mean gradient measures 9.0 mmHg. Aortic valve peak gradient measures 18.0 mmHg. Aortic valve area, by VTI measures 2.47  cm. Pulmonic Valve: The pulmonic valve was normal in structure. Pulmonic valve regurgitation is not visualized. No evidence of pulmonic stenosis. Aorta: The aortic root is normal in  size and structure. Venous: The inferior vena cava was not well visualized. IAS/Shunts: The interatrial septum was not well visualized.  LEFT VENTRICLE PLAX 2D LVIDd:         3.50 cm  Diastology LVIDs:         2.40 cm  LV e' medial:    7.07 cm/s LV PW:         1.00 cm  LV E/e' medial:  10.9 LV IVS:        1.00 cm  LV e' lateral:   10.10 cm/s LVOT diam:     1.90 cm  LV E/e' lateral: 7.6 LV SV:         98 LV SV Index:   50 LVOT Area:     2.84 cm  RIGHT VENTRICLE RV Basal diam:  3.70 cm LEFT ATRIUM             Index       RIGHT ATRIUM           Index LA diam:        4.00 cm 2.05 cm/m  RA Area:     18.20 cm LA Vol (A2C):    64.4 ml 33.03 ml/m RA Volume:   55.50 ml  28.46 ml/m LA Vol (A4C):   71.1 ml 36.46 ml/m LA Biplane Vol: 68.7 ml 35.23 ml/m  AORTIC VALVE AV Area (Vmax):    2.38 cm AV Area (Vmean):   2.38 cm AV Area (VTI):     2.47 cm AV Vmax:           212.00 cm/s AV Vmean:          136.000 cm/s AV VTI:            0.398 m AV Peak Grad:      18.0 mmHg AV Mean Grad:      9.0 mmHg LVOT Vmax:         178.00 cm/s LVOT Vmean:        114.000 cm/s LVOT VTI:          0.347 m LVOT/AV VTI ratio: 0.87  AORTA Ao Root diam: 2.70 cm Ao Asc diam:  3.00 cm MITRAL VALVE MV Area (PHT): 3.08 cm     SHUNTS MV Decel Time: 246 msec     Systemic VTI:  0.35 m MV E velocity: 77.10 cm/s   Systemic Diam: 1.90 cm MV A velocity: 101.00 cm/s MV E/A ratio:  0.76 Cherlynn Kaiser MD Electronically signed by Cherlynn Kaiser MD Signature Date/Time: 11/04/2020/2:31:56 PM    Final    IR ABDOMEN US LIMITED  Result Date: 11/04/2020 CLINICAL DATA:  History of cirrhosis now with concern for intra-abdominal ascites. Please perform ascites search ultrasound and ultrasound-guided paracentesis as indicated. EXAM: LIMITED ABDOMEN ULTRASOUND FOR ASCITES TECHNIQUE: Limited ultrasound survey for ascites was performed in all four abdominal quadrants. COMPARISON:  Abdominal MRI-11/03/2020; CT abdomen and pelvis-08/05/2020 FINDINGS: Sonographic evaluation of the abdomen demonstrates a trace amount of intra-abdominal ascites, too small to allow for safe ultrasound-guided paracentesis given patient's body habitus. No paracentesis attempted. IMPRESSION: Trace amount of intra-abdominal ascites, too small to allow for safe ultrasound-guided paracentesis given patient's body habitus. No paracentesis attempted. Electronically Signed   By: Sandi Mariscal M.D.   On: 11/04/2020 10:50   Discharge Instructions: Discharge Instructions    Call MD for:  difficulty breathing, headache or visual disturbances   Complete by: As directed    Call MD for:  extreme fatigue   Complete by:  As  directed    Call MD for:  persistant dizziness or light-headedness   Complete by: As directed    Call MD for:  persistant nausea and vomiting   Complete by: As directed    Call MD for:  severe uncontrolled pain   Complete by: As directed    Call MD for:  temperature >100.4   Complete by: As directed    Diet - low sodium heart healthy   Complete by: As directed           Discharge instructions   Complete by: As directed    Sra. Harvest Dark,  Fue un placer conocerlo durante su reciente hospitalizacin. Fue hospitalizado debido a la progresin de su cirrosis. Le recomendamos que tome los siguientes medicamentos  -Para su espironolactona (Aldactone): Anteriormente le recetaron 50 mg de espironolactona para tomar [employer]. Le recomendamos que aumente este medicamento a una dosis diaria total de 200 mg. Puedes hacerlo tomando cuatro de los comprimidos de 50 mg que ya tienes, todas las [place] que se te acabe. Una vez que se le acaben las tabletas de 50 mg, puede recoger la nueva receta de tabletas de 100 mg en la farmacia para pacientes ambulatorios de [place] su furosemida (Lasix): Anteriormente le recetaron 20 mg de furosemida para tomar [employer]. Le recomendamos que aumente este medicamento a una dosis diaria total de 80 mg. Puedes hacerlo tomando cuatro de los comprimidos de 68m que ya tienes en casa, todas las [place] se te acabe. Una vez que se le acaben las tabletas de 20 mg, puede recoger la nueva receta de tabletas de 80 mg en la farmacia para pacientes ambulatorios de  y tomar una tableta todas las maanas.  -Contine tomando carvedilol 3,125 mg d[place]opahy glipizida 10 mg al d[employer]  Adems, le recomendamos enfticamente que realice un seguimiento cercano con nuestra clnica y gastroenterologa dentro de una semana para analizar y planificar ms a fondo el control continuo de su cirrosis y la  preocupacin por un posible cncer de hgado. Si tiene alguna pregunta o inquietud, no dude en comunicarse con nuestra clnica al [phone]  Atentamente, Dr. MPaulla Dolly MD  Ms. CCharter  It was a pleasure meeting you during your recent hospitalization. You were hospitalized due to progression of your cirrhosis. We recommend that you take the following medications  -For your spironolactone (Aldactone): You previously were prescribed 526mspironolactone to take every day. We recommend that you increase this medication to a total daily dose of 20054mYou can do this by taking four of the 29m70mblets that you already have, every morning, until you run out. Once you run out of the 29mg68mlets, you can pickup the new prescription of 100mg 39mets [facility].  -For your furosemide (Lasix): You previously were prescribed 20mg f47memide to take every day. We recommend that you increase this medication to a total daily dose of 80mg. Y53man do this by taking four of the 20mg tab39m that you already have at home, every morning, until you run out. Once you run out of the 20mg tabl62m you can pickup the new prescription of 80[facility]21mrom  [facility].  -Continue taking your carvedilol 3.125mg twice 51my and glipizide 10mg daily. 25mo, we strongly recommend that you follow-up  closely with our clinic and gastroenterology within one week to further discuss and plan for continued management of your cirrhosis and concern for possible cancer of the liver. If you have any questions or concerns, please do not hesitate to contact our clinic at [phone].  Sincerely,  Dr. Paulla Dolly, MD   Increase activity slowly   Complete by: As directed      Signed: Cato Mulligan, MD 11/05/2020, 10:38 AM   Pager: [phone]

## 2020-11-08 ENCOUNTER — Telehealth: Payer: Self-pay

## 2020-11-08 NOTE — Telephone Encounter (Signed)
Transition Care Management Unsuccessful Follow-up Telephone Call  Date of discharge and from where:  11/05/20 [facility]  Attempts:  1st Attempt  Reason for unsuccessful TCM follow-up call:  No answer/busy

## 2020-11-08 NOTE — Telephone Encounter (Signed)
Hospital TOC per patient son, appt 11/10/2020.

## 2020-11-09 ENCOUNTER — Telehealth: Payer: Self-pay

## 2020-11-09 ENCOUNTER — Inpatient Hospital Stay (HOSPITAL_COMMUNITY)
Admission: EM | Admit: 2020-11-09 | Discharge: 2020-11-13 | DRG: 433 | Disposition: A | Discharge status: Hospice | Payer: Self-pay | Attending: Internal Medicine | Admitting: Internal Medicine

## 2020-11-09 ENCOUNTER — Ambulatory Visit: Payer: No Typology Code available for payment source | Admitting: Gastroenterology

## 2020-11-09 ENCOUNTER — Emergency Department (HOSPITAL_COMMUNITY): Payer: No Typology Code available for payment source

## 2020-11-09 DIAGNOSIS — Y92009 Unspecified place in unspecified non-institutional (private) residence as the place of occurrence of the external cause: Secondary | ICD-10-CM

## 2020-11-09 DIAGNOSIS — K7469 Other cirrhosis of liver: Principal | ICD-10-CM | POA: Diagnosis present

## 2020-11-09 DIAGNOSIS — G9340 Encephalopathy, unspecified: Secondary | ICD-10-CM

## 2020-11-09 DIAGNOSIS — M1990 Unspecified osteoarthritis, unspecified site: Secondary | ICD-10-CM | POA: Diagnosis present

## 2020-11-09 DIAGNOSIS — R188 Other ascites: Secondary | ICD-10-CM | POA: Diagnosis present

## 2020-11-09 DIAGNOSIS — W19XXXA Unspecified fall, initial encounter: Secondary | ICD-10-CM | POA: Diagnosis present

## 2020-11-09 DIAGNOSIS — Z811 Family history of alcohol abuse and dependence: Secondary | ICD-10-CM

## 2020-11-09 DIAGNOSIS — C220 Liver cell carcinoma: Secondary | ICD-10-CM | POA: Diagnosis present

## 2020-11-09 DIAGNOSIS — E871 Hypo-osmolality and hyponatremia: Secondary | ICD-10-CM | POA: Diagnosis present

## 2020-11-09 DIAGNOSIS — Z66 Do not resuscitate: Secondary | ICD-10-CM | POA: Diagnosis not present

## 2020-11-09 DIAGNOSIS — E11649 Type 2 diabetes mellitus with hypoglycemia without coma: Secondary | ICD-10-CM | POA: Diagnosis present

## 2020-11-09 DIAGNOSIS — Z833 Family history of diabetes mellitus: Secondary | ICD-10-CM

## 2020-11-09 DIAGNOSIS — K7682 Hepatic encephalopathy: Secondary | ICD-10-CM

## 2020-11-09 DIAGNOSIS — E8770 Fluid overload, unspecified: Secondary | ICD-10-CM | POA: Diagnosis present

## 2020-11-09 DIAGNOSIS — K7290 Hepatic failure, unspecified without coma: Secondary | ICD-10-CM | POA: Diagnosis present

## 2020-11-09 DIAGNOSIS — Z79899 Other long term (current) drug therapy: Secondary | ICD-10-CM

## 2020-11-09 DIAGNOSIS — R7989 Other specified abnormal findings of blood chemistry: Secondary | ICD-10-CM | POA: Diagnosis present

## 2020-11-09 DIAGNOSIS — K7460 Unspecified cirrhosis of liver: Secondary | ICD-10-CM | POA: Diagnosis present

## 2020-11-09 DIAGNOSIS — Z20822 Contact with and (suspected) exposure to covid-19: Secondary | ICD-10-CM | POA: Diagnosis present

## 2020-11-09 DIAGNOSIS — E669 Obesity, unspecified: Secondary | ICD-10-CM | POA: Diagnosis present

## 2020-11-09 DIAGNOSIS — E162 Hypoglycemia, unspecified: Secondary | ICD-10-CM | POA: Diagnosis present

## 2020-11-09 DIAGNOSIS — Z7984 Long term (current) use of oral hypoglycemic drugs: Secondary | ICD-10-CM

## 2020-11-09 DIAGNOSIS — K5900 Constipation, unspecified: Secondary | ICD-10-CM | POA: Diagnosis present

## 2020-11-09 DIAGNOSIS — C7951 Secondary malignant neoplasm of bone: Secondary | ICD-10-CM | POA: Diagnosis present

## 2020-11-09 DIAGNOSIS — R55 Syncope and collapse: Secondary | ICD-10-CM | POA: Diagnosis present

## 2020-11-09 DIAGNOSIS — E118 Type 2 diabetes mellitus with unspecified complications: Secondary | ICD-10-CM | POA: Diagnosis present

## 2020-11-09 DIAGNOSIS — Z888 Allergy status to other drugs, medicaments and biological substances status: Secondary | ICD-10-CM

## 2020-11-09 DIAGNOSIS — K7581 Nonalcoholic steatohepatitis (NASH): Secondary | ICD-10-CM | POA: Diagnosis present

## 2020-11-09 DIAGNOSIS — R1310 Dysphagia, unspecified: Secondary | ICD-10-CM | POA: Diagnosis present

## 2020-11-09 DIAGNOSIS — K766 Portal hypertension: Secondary | ICD-10-CM | POA: Diagnosis present

## 2020-11-09 DIAGNOSIS — D696 Thrombocytopenia, unspecified: Secondary | ICD-10-CM | POA: Diagnosis present

## 2020-11-09 DIAGNOSIS — D689 Coagulation defect, unspecified: Secondary | ICD-10-CM | POA: Diagnosis present

## 2020-11-09 DIAGNOSIS — Z515 Encounter for palliative care: Secondary | ICD-10-CM

## 2020-11-09 LAB — MAGNESIUM: Magnesium: 1.9 mg/dL (ref 1.7–2.4)

## 2020-11-09 LAB — CBG MONITORING, ED
Glucose-Capillary: 38 mg/dL — CL (ref 70–99)
Glucose-Capillary: 164 mg/dL — ABNORMAL HIGH (ref 70–99)
Glucose-Capillary: 233 mg/dL — ABNORMAL HIGH (ref 70–99)
Glucose-Capillary: 151 mg/dL — ABNORMAL HIGH (ref 70–99)
Glucose-Capillary: 107 mg/dL — ABNORMAL HIGH (ref 70–99)
Glucose-Capillary: 87 mg/dL (ref 70–99)
Glucose-Capillary: 80 mg/dL (ref 70–99)
Glucose-Capillary: 61 mg/dL — ABNORMAL LOW (ref 70–99)

## 2020-11-09 LAB — SALICYLATE LEVEL: Salicylate Lvl: 8.1 mg/dL (ref 7.0–30.0)

## 2020-11-09 LAB — CBC WITH DIFFERENTIAL/PLATELET
Abs Immature Granulocytes: 0.3 10*3/uL — ABNORMAL HIGH (ref 0.00–0.07)
Band Neutrophils: 10 %
Basophils Absolute: 0 10*3/uL (ref 0.0–0.1)
Basophils Relative: 0 %
Eosinophils Absolute: 0 10*3/uL (ref 0.0–0.5)
Eosinophils Relative: 0 %
HCT: 32.6 % — ABNORMAL LOW (ref 36.0–46.0)
Hemoglobin: 11.3 g/dL — ABNORMAL LOW (ref 12.0–15.0)
Lymphocytes Relative: 5 %
Lymphs Abs: 0.2 10*3/uL — ABNORMAL LOW (ref 0.7–4.0)
MCH: 33.2 pg (ref 26.0–34.0)
MCHC: 34.7 g/dL (ref 30.0–36.0)
MCV: 95.9 fL (ref 80.0–100.0)
Metamyelocytes Relative: 3 %
Monocytes Absolute: 0.2 10*3/uL (ref 0.1–1.0)
Monocytes Relative: 4 %
Myelocytes: 1 %
Neutro Abs: 3.8 10*3/uL (ref 1.7–7.7)
Neutrophils Relative %: 74 %
Platelets: 22 10*3/uL — CL (ref 150–400)
Promyelocytes Relative: 3 %
RBC: 3.4 MIL/uL — ABNORMAL LOW (ref 3.87–5.11)
RDW: 14.7 % (ref 11.5–15.5)
Smear Review: NORMAL
WBC: 4.5 10*3/uL (ref 4.0–10.5)
nRBC: 4.2 % — ABNORMAL HIGH (ref 0.0–0.2)

## 2020-11-09 LAB — URINALYSIS, ROUTINE W REFLEX MICROSCOPIC
Glucose, UA: NEGATIVE mg/dL
Hgb urine dipstick: NEGATIVE
Ketones, ur: 5 mg/dL — AB
Leukocytes,Ua: NEGATIVE
Nitrite: NEGATIVE
Protein, ur: NEGATIVE mg/dL
Specific Gravity, Urine: 1.021 (ref 1.005–1.030)
pH: 5 (ref 5.0–8.0)

## 2020-11-09 LAB — COMPREHENSIVE METABOLIC PANEL
ALT: 64 U/L — ABNORMAL HIGH (ref 0–44)
AST: 202 U/L — ABNORMAL HIGH (ref 15–41)
Albumin: 1.9 g/dL — ABNORMAL LOW (ref 3.5–5.0)
Alkaline Phosphatase: 350 U/L — ABNORMAL HIGH (ref 38–126)
Anion gap: 8 (ref 5–15)
BUN: 26 mg/dL — ABNORMAL HIGH (ref 6–20)
CO2: 26 mmol/L (ref 22–32)
Calcium: 10 mg/dL (ref 8.9–10.3)
Chloride: 88 mmol/L — ABNORMAL LOW (ref 98–111)
Creatinine, Ser: 0.76 mg/dL (ref 0.44–1.00)
GFR, Estimated: >60 mL/min (ref >60)
Glucose, Bld: 48 mg/dL — ABNORMAL LOW (ref 70–99)
Potassium: 4.4 mmol/L (ref 3.5–5.1)
Sodium: 122 mmol/L — ABNORMAL LOW (ref 135–145)
Total Bilirubin: 9.3 mg/dL — ABNORMAL HIGH (ref 0.3–1.2)
Total Protein: 5.6 g/dL — ABNORMAL LOW (ref 6.5–8.1)

## 2020-11-09 LAB — POC SARS CORONAVIRUS 2 AG -  ED: SARS Coronavirus 2 Ag: NEGATIVE

## 2020-11-09 LAB — AMMONIA: Ammonia: 140 umol/L — ABNORMAL HIGH (ref 9–35)

## 2020-11-09 LAB — SODIUM, URINE, RANDOM: Sodium, Ur: <10 mmol/L

## 2020-11-09 LAB — ETHANOL: Alcohol, Ethyl (B): <10 mg/dL (ref <10)

## 2020-11-09 LAB — GLUCOSE, CAPILLARY: Glucose-Capillary: 77 mg/dL (ref 70–99)

## 2020-11-09 LAB — LIPASE, BLOOD: Lipase: 134 U/L — ABNORMAL HIGH (ref 11–51)

## 2020-11-09 LAB — PHOSPHORUS: Phosphorus: 3.2 mg/dL (ref 2.5–4.6)

## 2020-11-09 LAB — ACETAMINOPHEN LEVEL: Acetaminophen (Tylenol), Serum: <10 ug/mL — ABNORMAL LOW (ref 10–30)

## 2020-11-09 LAB — OSMOLALITY, URINE: Osmolality, Ur: 673 mOsm/kg (ref 300–900)

## 2020-11-09 LAB — CK: Total CK: 56 U/L (ref 38–234)

## 2020-11-09 MED ORDER — LACTULOSE 10 GM/15ML PO SOLN
10.0000 g | Freq: Once | ORAL | Status: AC
  Administered 2020-11-09: 10 g via ORAL
  Filled 2020-11-09: qty 15

## 2020-11-09 MED ORDER — LACTULOSE 10 GM/15ML PO SOLN
10.0000 g | Freq: Two times a day (BID) | ORAL | Status: DC
  Administered 2020-11-09 – 2020-11-10 (×2): 10 g via ORAL
  Filled 2020-11-09 (×3): qty 15

## 2020-11-09 MED ORDER — DEXTROSE 50 % IV SOLN
INTRAVENOUS | Status: AC
  Filled 2020-11-09: qty 50

## 2020-11-09 MED ORDER — ACETAMINOPHEN 325 MG PO TABS
650.0000 mg | ORAL_TABLET | Freq: Four times a day (QID) | ORAL | Status: DC | PRN
  Administered 2020-11-13: 650 mg via ORAL
  Filled 2020-11-09: qty 2

## 2020-11-09 MED ORDER — FUROSEMIDE 40 MG PO TABS
40.0000 mg | ORAL_TABLET | Freq: Every day | ORAL | Status: DC
  Administered 2020-11-09: 40 mg via ORAL
  Filled 2020-11-09: qty 2

## 2020-11-09 MED ORDER — CARVEDILOL 3.125 MG PO TABS
3.1250 mg | ORAL_TABLET | Freq: Two times a day (BID) | ORAL | Status: DC
  Administered 2020-11-10: 3.125 mg via ORAL
  Filled 2020-11-09: qty 1

## 2020-11-09 MED ORDER — POLYETHYLENE GLYCOL 3350 17 G PO PACK: 17.0000 g | PACK | Freq: Every day | ORAL | Status: DC | PRN

## 2020-11-09 MED ORDER — SPIRONOLACTONE 25 MG PO TABS
100.0000 mg | ORAL_TABLET | Freq: Every day | ORAL | Status: DC
  Administered 2020-11-09: 100 mg via ORAL
  Filled 2020-11-09: qty 1

## 2020-11-09 MED ORDER — ACETAMINOPHEN 650 MG RE SUPP: 650.0000 mg | Freq: Four times a day (QID) | RECTAL | Status: DC | PRN

## 2020-11-09 NOTE — Telephone Encounter (Signed)
Patient's husband walked in clinic for advice on patient.  Language [employer] used for translation.  Pt discharged from [facility] hospital on 11/05/20.  Pt states pt is worse since discharge.  States pt "can't complete phrases and when she speaks it doesn't make any sense.  She c/o dizziness, has some slurred speech, c/o heavy arms and legs, not eating, and not able to get up anymore".  RN informed patient all above symptoms are very worrisome and he should call 911 and have patient transported to [facility] Emergency Department immediately for evaluation.  Patient's husband is in agreement, states patient's brother is with her now, and he verbalized understanding.  [facility] attending, Dr. Heber Rivereno notified. SChaplin, RN,BSN

## 2020-11-09 NOTE — ED Notes (Signed)
PA  Lyden made aware of CBG 80

## 2020-11-09 NOTE — H&P (Signed)
Date: 11/09/2020               Patient Name:  Deborah Pham MRN: [id number]  DOB: [birth year] Age / Sex: 55 y.o., female   PCP: [name], MD         Medical Service: Internal Medicine Teaching Service         Attending Physician: Dr. Jimmye Norman, Elaina Pattee, MD    First Contact: Cato Mulligan, MD Pager: Luisa Dago [phone]  Second Contact: Tamsen Snider, MD Pager: Cherly Hensen [phone]       After Hours (After 5p/  First Contact Pager: [phone]  weekends / holidays): Second Contact Pager: [phone]   SUBJECTIVE   Chief Complaint: Syncope  History of Present Illness: Grabiela Pham is a 55 y.o. female with a pertinent PMH of cirrhosis, type 2 diabetes, osteoarthritis, hyponatremia, and a recent admission to [facility] for decompensated cirrhosis in the setting of possible metastatic hepatocellular carcinoma who presents to [facility] after an episode of hypoglycemia.  History was obtained from the patient's chart as well as the patient and her husband who is at bedside..  Patient states that she has been doing well since being discharged.  She states that she started to feel poorly yesterday morning.  She states that she had a difficult time getting out of bed, walking, and speaking.  She characterized this as generalized weakness.  She denies any focal neurologic deficits, nausea, or emesis.  She did endorse recent anorexia and describes symptoms of dysphagia made it difficult for her to maintain good p.o. intake.  She believes that this may have contributed to her presentation to [facility] today, stating that she remained on her diabetes medication despite poor p.o. nutrition.  Patient denied any muscle rigidity, confusion, increased abdominal pain, bloating.  Letter lower extremity swelling was close to her baseline.  She states that she been taking all of her medications as prescribed without missed doses.  She denies any fever, chills, sick contacts.  She has been urinating well since  starting her fluid pills but recently started developing dark urine which started today.  Otherwise she states she admits to having difficulty with having bowel movements but states she had one today which was normal color and consistency.  She denies any other acute symptoms.  And EMS, the patient was found to have a blood sugar in the 30s and received an amp of D50 which was consistent with her blood sugar on arrival to [facility] which was in the 16s.  She subsequently received another amp of D50.  Her metabolic panel was significant for a hyponatremia, albumin of 1.9, a mixed hepatocellular and cholestasis pattern of injury, and elevated lipase and ammonia, this was positive for ketones and bilirubin.  Medications:  No outpatient medications have been marked as taking for the 11/09/20 encounter [facility] Encounter).    Social:  Lives -in [place] with her husband Occupation -unknown Support -husband Level of function -currently patient is dependent with ADLs with support from her husband PCP -Dr. Mayer Masker Substance use -denies tobacco use, alcohol use, or nonprescription drug use  Family History: Family History  Problem Relation Age of Onset  . Liver disease Father   . Alcoholism Father   . Diabetes Mother   . Colon cancer Neg Hx   . Breast cancer Neg Hx   . Stomach cancer Neg Hx   . Throat cancer Neg Hx   . Pancreatic cancer Neg Hx     Allergies: Allergies  as of 11/09/2020 - Review Complete 11/09/2020  Allergen Reaction Noted  . Lisinopril Cough 08/07/2013   Past Medical History:  Diagnosis Date  . Diabetes mellitus without complication ([place])   . Fatty liver   . Gastric varices   . NASH (nonalcoholic steatohepatitis)   . Obesity     Review of Systems: A complete ROS was negative except as per HPI.   OBJECTIVE:   Physical Exam: Blood pressure 116/68, pulse (!) 110, temperature 97.9 F (36.6 C), resp. rate (!) 23, SpO2 98 %. Physical Exam Constitutional:       General: She is not in acute distress.    Appearance: She is obese. She is ill-appearing. She is not diaphoretic.  HENT:     Mouth/Throat:     Mouth: Mucous membranes are moist.  Eyes:     General: Scleral icterus present.     Extraocular Movements: Extraocular movements intact.  Cardiovascular:     Rate and Rhythm: Tachycardia present.     Pulses: Normal pulses.     Heart sounds: No murmur heard. No friction rub. No gallop.   Pulmonary:     Effort: Pulmonary effort is normal.     Breath sounds: No wheezing or rales.     Comments: Distant breath sounds due to body habitus Abdominal:     General: There is distension.     Palpations: Abdomen is soft. There is shifting dullness, fluid wave and hepatomegaly.     Tenderness: There is no abdominal tenderness.     Comments: Diffuse abdominal induration/swelling. Abdominal striae  Musculoskeletal:        General: Swelling (Diffuse) present.     Cervical back: Normal range of motion. No rigidity.     Right lower leg: Edema (2-3+ to the thigh) present.     Left lower leg: Edema (2-3+ to the thigh) present.  Skin:    General: Skin is warm and dry.     Coloration: Skin is jaundiced.     Findings: No bruising.  Neurological:     General: No focal deficit present.     Mental Status: She is alert and oriented to person, place, and time.     Motor: Weakness present.     Comments: Sleepy but arousable      Labs: CBC    Component Value Date/Time   WBC 4.5 11/09/2020 1147   RBC 3.40 (L) 11/09/2020 1147   HGB 11.3 (L) 11/09/2020 1147   HGB 14.9 02/21/2017 1603   HCT 32.6 (L) 11/09/2020 1147   HCT 40.4 02/21/2017 1603   PLT 22 (LL) 11/09/2020 1147   PLT 96 (LL) 02/21/2017 1603   MCV 95.9 11/09/2020 1147   MCV 93 02/21/2017 1603   MCH 33.2 11/09/2020 1147   MCHC 34.7 11/09/2020 1147   RDW 14.7 11/09/2020 1147   RDW 13.8 02/21/2017 1603   LYMPHSABS 0.2 (L) 11/09/2020 1147   MONOABS 0.2 11/09/2020 1147   EOSABS 0.0 11/09/2020  1147   BASOSABS 0.0 11/09/2020 1147     CMP     Component Value Date/Time   NA 122 (L) 11/09/2020 1147   NA 133 (L) 06/24/2020 1500   K 4.4 11/09/2020 1147   CL 88 (L) 11/09/2020 1147   CO2 26 11/09/2020 1147   GLUCOSE 48 (L) 11/09/2020 1147   BUN 26 (H) 11/09/2020 1147   BUN 12 06/24/2020 1500   CREATININE 0.76 11/09/2020 1147   CALCIUM 10.0 11/09/2020 1147   PROT 5.6 (L) 11/09/2020 1147  PROT 6.9 02/21/2017 1603   ALBUMIN 1.9 (L) 11/09/2020 1147   ALBUMIN 3.6 02/21/2017 1603   AST 202 (H) 11/09/2020 1147   ALT 64 (H) 11/09/2020 1147   ALKPHOS 350 (H) 11/09/2020 1147   BILITOT 9.3 (H) 11/09/2020 1147   BILITOT 0.9 02/21/2017 1603   GFRNONAA >60 11/09/2020 1147   GFRAA 87 06/24/2020 1500    Imaging: CT Head Wo Contrast  Result Date: 11/09/2020 CLINICAL DATA:  Altered mental status. History of cirrhosis with suspicious liver lesion and indeterminate bone lesions on a recent abdominal CT. EXAM: CT HEAD WITHOUT CONTRAST TECHNIQUE: Contiguous axial images were obtained from the base of the skull through the vertex without intravenous contrast. COMPARISON:  None. FINDINGS: Brain: There is no evidence of an acute infarct, intracranial hemorrhage, mass, midline shift, or extra-axial fluid collection. The ventricles and sulci are normal. Vascular: No hyperdense vessel. Skull: No fracture. At least 7 scattered small lucent skull lesions with the largest measuring 1.4 cm near the frontoparietal vertex on the right. Sinuses/Orbits: Paranasal sinuses and mastoid air cells are clear. Unremarkable orbits Other: None. IMPRESSION: 1. Unremarkable CT appearance of the brain. 2. Multiple small lucent skull lesions which are indeterminate but raise concern for metastatic disease. Electronically Signed   By: Logan Bores M.D.   On: 11/09/2020 13:11    EKG: personally reviewed my interpretation is sinus tachycardia  ASSESSMENT & PLAN:   Active Problems:   Hypoglycemia   [name] Brunilda Eble is a 55 y.o. with pertinent PMH of cirrhosis, type 2 diabetes, osteoarthritis, hyponatremia, and a recent admission to [facility] for decompensated cirrhosis in the setting of possible metastatic hepatocellular carcinoma who presents to [facility] after an episode of hypoglycemia and was admitted for hyperglycemia and syncope evaluation.    #Syncope #Acute encephalopathy, multifactorial Patient presented to [facility] after a syncopal episode with some concern for acute encephalopathy.  Differential diagnosis includes hypoglycemia versus possible hepatic encephalopathy.  Patient did have an episode of hypoglycemia in the 30s in which she was symptomatic with significant generalized weakness.  Patient's hyperglycemia likely secondary to glipizide use in the setting of decreased p.o. intake.  Hypoglycemia improved with supplementation of dextrose.  Patient also has significant hepatic cirrhosis with signs of fluid overload that appears subacute/chronic.  Her ammonia was significantly elevated above 100 which could be contributing to her mild sleepiness.  Otherwise, there are no acid-base disturbances noticed on her metabolic panel.  She does not have a leukocytosis or any obvious signs of infection including a benign abdominal exam.  It is possible that the patient's hyponatremia could be contributing to her encephalopathy, but it appears that this has been ongoing from past admissions and therefore, is less likely to be the culprit.  -CBG monitoring -Supplement with D10/D50 as needed -Lactulose, titrate to achieve 2-3 bowel movements daily -Sodium studies-including serum/ urine sodium and osmolality. -Seizure and aspiration precautions  #Decompensated cirrhosis (Child-Pugh C, MELD 27%) #Hyponatremia #Thrombocytopenia #Elevated ammonia #Bilirubinuria  Patient presents with decompensated cirrhosis.  Her hepatic labs seem to be relatively consistent with her labs at discharge from her previous  hospitalization.  Her hepatic cirrhosis is likely secondary to hepatocellular carcinoma with mets.  We will continue her diuretic therapy while she is admitted as she appears extravascular volume overloaded.  We will hold off on DVT prophylaxis due to significant thrombocytopenia.  Patient's hyponatremia likely improve with diuretic therapy.  Will get a nutrition consult for high-protein diet. -Continue home Lasix and spironolactone -Patient may  need abdominal ultrasound and possible paracentesis if not previously done. -No emergent need for SBP prophylaxis -We will get hyponatremia labs and treat appropriately -We will consult RD for high-protein diet -Weight DVT prophylaxis due to thrombocytopenia -Hepatic labs daily -We will start lactulose with a goal of 2-3 bowel movements daily.  #Dysphagia: Patient presents with complaints of dysphagia.  Likely contributing to decreased p.o. intake -SLP evaluation  #Hepatic mass, concern for hepatocellular carcinoma Patient presents with concern for hepatocellular carcinoma with likely distant mets.  Considering the patient has a significantly elevated meld score with a 27% risk of mortality in 90 days, I have consulted palliative care for goals of care conversation and to manage expectations through this process. - Palliative Care consulted  - Consider consulting oncology/surgery for tissue diagnosis.    #T2DM: -CBG monitoring -Consider starting sliding scale insulin  #Constipation -MiraLAX as needed - Lactulose titrate to achieve 2-3 bowel movements.   Diet: NPO (pending SLP) VTE: None IVF: None,None Code: Full  Prior to Admission Living Arrangement: Home, living Husband Anticipated Discharge Location: Pending Barriers to Discharge: Further evaluation and management  Dispo: Admit patient to Observation with expected length of stay less than 2 midnights.  Signed: Lawerance Cruel, D.O.  Internal Medicine Resident, PGY-2 [facility]  Internal Medicine Residency  Pager: [phone] 4:37 PM, 11/09/2020   Please contact the on call pager after 5 pm and on weekends at [phone].

## 2020-11-09 NOTE — ED Provider Notes (Signed)
[place] EMERGENCY DEPARTMENT Provider Note   CSN: [id number] Arrival date & time: 11/09/20  1102     History No chief complaint on file.   Deborah Pham is a 55 y.o. female with PMH/o NASH, Gastric varices, DM brought in by EMS for evaluation of AMS and hypoglycemia.  EMS reported that family started noticing that patient has had some decreased responsiveness over the last few days.  They report last night, patient would not get up and walk around and was very lethargic.  This morning, they finally called EMS to evaluate patient.  On EMS arrival, patient's blood glucose was in the 30s.  She takes glipizide.  Patient was given an amp of D50 which improved her mental status.  Patient states she does not really remember what happened last night.  She denies any pain at this time.  EM LEVEL 5 CAVEAT DUE TO CHANGE IN MENTATION  The history is provided by the patient.       Past Medical History:  Diagnosis Date  . Diabetes mellitus without complication ([place])   . Fatty liver   . Gastric varices   . NASH (nonalcoholic steatohepatitis)   . Obesity     Patient Active Problem List   Diagnosis Date Noted  . Hepatocellular carcinoma ([place])   . Hyperbilirubinemia   . Elevated AFP   . Decompensated hepatic cirrhosis ([place]) 11/03/2020  . Hyponatremia 11/03/2020  . Meralgia paraesthetica, left 09/26/2020  . Lateral pain of right hip 01/14/2020  . Bilateral lower extremity edema 11/09/2019  . Bilateral Hand pain 10/12/2019  . Screening breast examination 05/07/2019  . Osteoarthritis 12/01/2017  . Health care maintenance 09/16/2017  . Type 2 diabetes mellitus with complication ([place]) 25/42/7062  . 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 Other cirrhosis of liver ([place]) 06/21/2016    Past Surgical History:  Procedure Laterality Date  . ESOPHAGOGASTRODUODENOSCOPY       OB History    Gravida  3   Para      Term      Preterm      AB  1   Living  2      SAB  1   IAB      Ectopic      Multiple      Live Births  2           Family History  Problem Relation Age of Onset  . Liver disease Father   . Alcoholism Father   . Diabetes Mother   . Colon cancer Neg Hx   . Breast cancer Neg Hx   . Stomach cancer Neg Hx   . Throat cancer Neg Hx   . Pancreatic cancer Neg Hx     Social History   Tobacco Use  . Smoking status: Never Smoker  . Smokeless tobacco: Never Used  Vaping Use  . Vaping Use: Never used  Substance Use Topics  . Alcohol use: No  . Drug use: No    Home Medications Prior to Admission medications   Medication Sig Start Date End Date Taking? Authorizing Provider  carvedilol (COREG) 3.125 MG tablet Take 1 tablet (3.125 mg total) by mouth 2 (two) times daily with a meal. 07/19/20   Danis, Kirke Corin, MD  furosemide (LASIX) 80 MG tablet Take 1 tablet (80 mg total) by mouth daily. 11/06/20   Cato Mulligan, MD  glipiZIDE (GLUCOTROL) 10 MG tablet Take 1 tablet (10 mg total) by mouth daily before breakfast. 09/26/20 03/25/21  Lacinda Axon, MD  glucose blood (CONTOUR NEXT TEST) test strip Use as instructed 01/14/20   [name], MD  spironolactone (ALDACTONE) 100 MG tablet Take 2 tablets (200 mg total) by mouth daily. 11/06/20   Cato Mulligan, MD    Allergies    Lisinopril  Review of Systems   Review of Systems  Unable to perform ROS: Mental status change    Physical Exam Updated Vital Signs BP (!) 116/58   Pulse (!) 106   Temp 97.9 F (36.6 C)   Resp 20   SpO2 96%   Physical Exam Vitals and nursing note reviewed.  Constitutional:      Appearance: Normal appearance. She is well-developed.  HENT:     Head: Normocephalic and atraumatic.  Eyes:     General: Lids are normal.     Conjunctiva/sclera: Conjunctivae normal.     Pupils: Pupils are equal, round, and reactive to light.     Comments: Scleral icterus bilaterally.  Cardiovascular:     Rate and Rhythm: Normal rate and regular  rhythm.     Pulses: Normal pulses.     Heart sounds: Normal heart sounds. No murmur heard. No friction rub. No gallop.   Pulmonary:     Effort: Pulmonary effort is normal.     Breath sounds: Normal breath sounds.     Comments: Lungs clear to auscultation bilaterally.  Symmetric chest rise.  No wheezing, rales, rhonchi. Abdominal:     Palpations: Abdomen is not rigid. There is fluid wave.     Tenderness: There is no abdominal tenderness. There is no guarding.     Comments: Protuberant abdomen.  No tenderness noted.  Musculoskeletal:        General: Normal range of motion.     Cervical back: Full passive range of motion without pain.  Skin:    General: Skin is warm and dry.     Capillary Refill: Capillary refill takes less than 2 seconds.     Coloration: Skin is jaundiced.  Neurological:     Mental Status: She is alert.     Comments: She can tell me her name and where she is at.  She cannot tell me what year it is. Follows commands.  Cranial nerves III-XII intact 5/5 strength to BUE and BLE  Sensation intact throughout all major nerve distributions No gait abnormalities  No slurred speech. No facial droop.  No asterixis  Psychiatric:        Speech: Speech normal.     ED Results / Procedures / Treatments   Labs (all labs ordered are listed, but only abnormal results are displayed) Labs Reviewed  COMPREHENSIVE METABOLIC PANEL - Abnormal; Notable for the following components:      Result Value   Sodium 122 (*)    Chloride 88 (*)    Glucose, Bld 48 (*)    BUN 26 (*)    Total Protein 5.6 (*)    Albumin 1.9 (*)    AST 202 (*)    ALT 64 (*)    Alkaline Phosphatase 350 (*)    Total Bilirubin 9.3 (*)    All other components within normal limits  ACETAMINOPHEN LEVEL - Abnormal; Notable for the following components:   Acetaminophen (Tylenol), Serum <10 (*)    All other components within normal limits  CBC WITH DIFFERENTIAL/PLATELET - Abnormal; Notable for the following  components:   RBC 3.40 (*)    Hemoglobin 11.3 (*)    HCT 32.6 (*)  Platelets 22 (*)    nRBC 4.2 (*)    Lymphs Abs 0.2 (*)    Abs Immature Granulocytes 0.30 (*)    All other components within normal limits  LIPASE, BLOOD - Abnormal; Notable for the following components:   Lipase 134 (*)    All other components within normal limits  URINALYSIS, ROUTINE W REFLEX MICROSCOPIC - Abnormal; Notable for the following components:   Color, Urine AMBER (*)    Bilirubin Urine SMALL (*)    Ketones, ur 5 (*)    All other components within normal limits  AMMONIA - Abnormal; Notable for the following components:   Ammonia 140 (*)    All other components within normal limits  CBG MONITORING, ED - Abnormal; Notable for the following components:   Glucose-Capillary 38 (*)    All other components within normal limits  CBG MONITORING, ED - Abnormal; Notable for the following components:   Glucose-Capillary 233 (*)    All other components within normal limits  CBG MONITORING, ED - Abnormal; Notable for the following components:   Glucose-Capillary 164 (*)    All other components within normal limits  CBG MONITORING, ED - Abnormal; Notable for the following components:   Glucose-Capillary 151 (*)    All other components within normal limits  CBG MONITORING, ED - Abnormal; Notable for the following components:   Glucose-Capillary 107 (*)    All other components within normal limits  URINE CULTURE  ETHANOL  SALICYLATE LEVEL  CK  CBG MONITORING, ED  POC SARS CORONAVIRUS 2 AG -  ED  CBG MONITORING, ED    EKG None  Radiology CT Head Wo Contrast  Result Date: 11/09/2020 CLINICAL DATA:  Altered mental status. History of cirrhosis with suspicious liver lesion and indeterminate bone lesions on a recent abdominal CT. EXAM: CT HEAD WITHOUT CONTRAST TECHNIQUE: Contiguous axial images were obtained from the base of the skull through the vertex without intravenous contrast. COMPARISON:  None. FINDINGS:  Brain: There is no evidence of an acute infarct, intracranial hemorrhage, mass, midline shift, or extra-axial fluid collection. The ventricles and sulci are normal. Vascular: No hyperdense vessel. Skull: No fracture. At least 7 scattered small lucent skull lesions with the largest measuring 1.4 cm near the frontoparietal vertex on the right. Sinuses/Orbits: Paranasal sinuses and mastoid air cells are clear. Unremarkable orbits Other: None. IMPRESSION: 1. Unremarkable CT appearance of the brain. 2. Multiple small lucent skull lesions which are indeterminate but raise concern for metastatic disease. Electronically Signed   By: Logan Bores M.D.   On: 11/09/2020 13:11    Procedures Procedures   Medications Ordered in ED Medications  dextrose 50 % solution (has no administration in time range)  lactulose (CHRONULAC) 10 GM/15ML solution 10 g (has no administration in time range)    ED Course  I have reviewed the triage vital signs and the nursing notes.  Pertinent labs & imaging results that were available during my care of the patient were reviewed by me and considered in my medical decision making (see chart for details).    MDM Rules/Calculators/A&P                          55 year old female brought in by EMS for evaluation of altered mental status, hypoglycemia.  Family reports that patient has had decreased responsiveness over the last several days.  On EMS arrival, they checked her glucose and was 37.  Patient is was given an amp  of D50 when she got to the ED.  Her blood pressure improved to 230s.  She is more alert and can answer questions.  She can tell me her name and where she is at but does not know the year it is.  She follows commands.  She does have a protuberant abdomen and fluid wave.  No tenderness noted.   Repeat CBG is 164  She was admitted on 38/10/22 and discharged on 11/05/2020.  She had worsening hepatic cirrhosis with concern for possible hepatocellular carcinoma.  Her  Lasix was increased as well as her spironolactone.  She also did have some hyponatremia.  Ammonia is elevated at 140.  CBC shows no leukocytosis.  Hemoglobin is 11.3.  Platelets are 22.  Ethanol, salicylate, acetaminophen level unremarkable.  Lipase is slightly elevated at 134.  She has had previous elevations.  CMP shows worsening elevation in her LFTs.  Bilirubin is bumped up slightly from previous visits.  Head CT shows no acute intracranial normalities.  She does have small lucent skull lesions which are indeterminate but raise concern for metastatic disease.  Husband is at bedside.  I discussed with him with the language interpreter.  He states that he has been checking her blood sugars.  He has noticed that they have been slightly low.  He reports that she has not been eating or drinking.  Denies any vomiting.  He does report that he has been can continue to give the glipizide.  He reports that today, the brother checked the blood sugar and it was low and so the husband gave the glipizide right after that.  Question if this is contributing to her hypoglycemia.  Patient is more alert and he does report improvement in her overall mentation but is still slightly confused.  At this time, concern for continued hepatic encephalopathy.  Will consult for admission.  Discussed patient with Dr. Marianna Payment (internal medicine) who accepts patient for admission.   Portions of this note were generated with [employer]. Dictation errors may occur despite best attempts at proofreading.   Final Clinical Impression(s) / ED Diagnoses Final diagnoses:  Hepatic encephalopathy ([place])  Hypoglycemia    Rx / DC Orders ED Discharge Orders    None       Desma Mcgregor 11/09/20 1531    Elnora Morrison, MD 11/10/20 [phone]

## 2020-11-09 NOTE — ED Notes (Signed)
Patient transported to CT 

## 2020-11-09 NOTE — ED Notes (Signed)
Patient CBG 61. This RN referenced hypoglycemia protocol per order and given 4oz of orange juice

## 2020-11-09 NOTE — Hospital Course (Addendum)
Waking up, standing up and speech issues   ay and it was normal

## 2020-11-09 NOTE — ED Notes (Signed)
PA [place] notified of platelets of 22

## 2020-11-09 NOTE — Telephone Encounter (Signed)
Transition Care Management follow-up  Patient's husband walked in clinic for advice on patient.  Language [employer] used for translation.  Pt discharged from [facility] hospital on 11/05/20.  Pt states pt is worse since discharge.  States pt "can't complete phrases and when she speaks it doesn't make any sense.  She c/o dizziness, has some slurred speech, c/o heavy arms and legs, not eating, and not able to get up anymore".  RN informed patient all above symptoms are very worrisome and he should call 911 and have patient transported to [facility] Emergency Department immediately for evaluation.  Patient's husband is in agreement, states patient's brother is with her now, and he verbalized understanding.  [facility] attending, Dr. Heber Gray notified. SChaplin, RN,BSN

## 2020-11-09 NOTE — Telephone Encounter (Signed)
Discussed with Stacee, agree needs eval in ED.

## 2020-11-09 NOTE — ED Notes (Signed)
Pt back from CT

## 2020-11-09 NOTE — ED Notes (Signed)
Pt given ED happy meal

## 2020-11-10 ENCOUNTER — Encounter: Payer: No Typology Code available for payment source | Admitting: Internal Medicine

## 2020-11-10 ENCOUNTER — Encounter (HOSPITAL_COMMUNITY): Payer: Self-pay | Admitting: Internal Medicine

## 2020-11-10 ENCOUNTER — Inpatient Hospital Stay (HOSPITAL_COMMUNITY): Payer: No Typology Code available for payment source

## 2020-11-10 DIAGNOSIS — E11649 Type 2 diabetes mellitus with hypoglycemia without coma: Secondary | ICD-10-CM

## 2020-11-10 DIAGNOSIS — K7460 Unspecified cirrhosis of liver: Secondary | ICD-10-CM | POA: Diagnosis present

## 2020-11-10 DIAGNOSIS — Z79899 Other long term (current) drug therapy: Secondary | ICD-10-CM

## 2020-11-10 DIAGNOSIS — E871 Hypo-osmolality and hyponatremia: Secondary | ICD-10-CM

## 2020-11-10 DIAGNOSIS — R600 Localized edema: Secondary | ICD-10-CM

## 2020-11-10 DIAGNOSIS — R000 Tachycardia, unspecified: Secondary | ICD-10-CM

## 2020-11-10 DIAGNOSIS — D696 Thrombocytopenia, unspecified: Secondary | ICD-10-CM

## 2020-11-10 DIAGNOSIS — R140 Abdominal distension (gaseous): Secondary | ICD-10-CM

## 2020-11-10 DIAGNOSIS — E669 Obesity, unspecified: Secondary | ICD-10-CM

## 2020-11-10 DIAGNOSIS — D649 Anemia, unspecified: Secondary | ICD-10-CM

## 2020-11-10 DIAGNOSIS — C220 Liver cell carcinoma: Secondary | ICD-10-CM

## 2020-11-10 DIAGNOSIS — C7951 Secondary malignant neoplasm of bone: Secondary | ICD-10-CM

## 2020-11-10 DIAGNOSIS — K7581 Nonalcoholic steatohepatitis (NASH): Secondary | ICD-10-CM

## 2020-11-10 DIAGNOSIS — K7290 Hepatic failure, unspecified without coma: Secondary | ICD-10-CM

## 2020-11-10 LAB — GLUCOSE, CAPILLARY
Glucose-Capillary: 36 mg/dL — CL (ref 70–99)
Glucose-Capillary: 89 mg/dL (ref 70–99)
Glucose-Capillary: 82 mg/dL (ref 70–99)
Glucose-Capillary: 102 mg/dL — ABNORMAL HIGH (ref 70–99)
Glucose-Capillary: 127 mg/dL — ABNORMAL HIGH (ref 70–99)
Glucose-Capillary: 144 mg/dL — ABNORMAL HIGH (ref 70–99)
Glucose-Capillary: 168 mg/dL — ABNORMAL HIGH (ref 70–99)

## 2020-11-10 LAB — PROTIME-INR
INR: 1.6 — ABNORMAL HIGH (ref 0.8–1.2)
Prothrombin Time: 18 seconds — ABNORMAL HIGH (ref 11.4–15.2)

## 2020-11-10 LAB — COMPREHENSIVE METABOLIC PANEL
ALT: 70 U/L — ABNORMAL HIGH (ref 0–44)
AST: 208 U/L — ABNORMAL HIGH (ref 15–41)
Albumin: 1.8 g/dL — ABNORMAL LOW (ref 3.5–5.0)
Alkaline Phosphatase: 342 U/L — ABNORMAL HIGH (ref 38–126)
Anion gap: 10 (ref 5–15)
BUN: 23 mg/dL — ABNORMAL HIGH (ref 6–20)
CO2: 26 mmol/L (ref 22–32)
Calcium: 10.3 mg/dL (ref 8.9–10.3)
Chloride: 87 mmol/L — ABNORMAL LOW (ref 98–111)
Creatinine, Ser: 0.88 mg/dL (ref 0.44–1.00)
GFR, Estimated: >60 mL/min (ref >60)
Glucose, Bld: 43 mg/dL — CL (ref 70–99)
Potassium: 4.3 mmol/L (ref 3.5–5.1)
Sodium: 123 mmol/L — ABNORMAL LOW (ref 135–145)
Total Bilirubin: 9.5 mg/dL — ABNORMAL HIGH (ref 0.3–1.2)
Total Protein: 5.6 g/dL — ABNORMAL LOW (ref 6.5–8.1)

## 2020-11-10 LAB — CBC
HCT: 28.7 % — ABNORMAL LOW (ref 36.0–46.0)
Hemoglobin: 10.7 g/dL — ABNORMAL LOW (ref 12.0–15.0)
MCH: 34.3 pg — ABNORMAL HIGH (ref 26.0–34.0)
MCHC: 37.3 g/dL — ABNORMAL HIGH (ref 30.0–36.0)
MCV: 92 fL (ref 80.0–100.0)
Platelets: 17 10*3/uL — CL (ref 150–400)
RBC: 3.12 MIL/uL — ABNORMAL LOW (ref 3.87–5.11)
RDW: 14.5 % (ref 11.5–15.5)
WBC: 5.3 10*3/uL (ref 4.0–10.5)
nRBC: 2.8 % — ABNORMAL HIGH (ref 0.0–0.2)

## 2020-11-10 LAB — URINE CULTURE

## 2020-11-10 LAB — FIBRINOGEN: Fibrinogen: 222 mg/dL (ref 210–475)

## 2020-11-10 LAB — APTT: aPTT: 33 seconds (ref 24–36)

## 2020-11-10 MED ORDER — INSULIN ASPART 100 UNIT/ML ~~LOC~~ SOLN: 0.0000 [IU] | Freq: Every day | SUBCUTANEOUS | Status: DC

## 2020-11-10 MED ORDER — IOHEXOL 300 MG/ML  SOLN
75.0000 mL | Freq: Once | INTRAMUSCULAR | Status: AC | PRN
  Administered 2020-11-10: 75 mL via INTRAVENOUS

## 2020-11-10 MED ORDER — SPIRONOLACTONE 25 MG PO TABS
200.0000 mg | ORAL_TABLET | Freq: Every day | ORAL | Status: DC
  Administered 2020-11-10 – 2020-11-13 (×4): 200 mg via ORAL
  Filled 2020-11-10 (×4): qty 8

## 2020-11-10 MED ORDER — ENSURE ENLIVE PO LIQD
237.0000 mL | Freq: Three times a day (TID) | ORAL | Status: DC
  Administered 2020-11-11 – 2020-11-12 (×3): 237 mL via ORAL

## 2020-11-10 MED ORDER — RIFAXIMIN 550 MG PO TABS
550.0000 mg | ORAL_TABLET | Freq: Two times a day (BID) | ORAL | Status: DC
Start: 2020-11-10 — End: 2020-11-12
  Administered 2020-11-10 – 2020-11-12 (×4): 550 mg via ORAL
  Filled 2020-11-10 (×4): qty 1

## 2020-11-10 MED ORDER — PROSOURCE PLUS PO LIQD
30.0000 mL | Freq: Two times a day (BID) | ORAL | Status: DC
  Administered 2020-11-11 – 2020-11-13 (×5): 30 mL via ORAL
  Filled 2020-11-10 (×4): qty 30

## 2020-11-10 MED ORDER — FUROSEMIDE 80 MG PO TABS
80.0000 mg | ORAL_TABLET | Freq: Every day | ORAL | Status: DC
  Administered 2020-11-10 – 2020-11-13 (×4): 80 mg via ORAL
  Filled 2020-11-10 (×4): qty 1

## 2020-11-10 MED ORDER — LACTULOSE 10 GM/15ML PO SOLN
30.0000 g | Freq: Three times a day (TID) | ORAL | Status: DC
  Administered 2020-11-10 – 2020-11-11 (×5): 30 g via ORAL
  Filled 2020-11-10 (×6): qty 45

## 2020-11-10 MED ORDER — INSULIN ASPART 100 UNIT/ML ~~LOC~~ SOLN
0.0000 [IU] | SUBCUTANEOUS | Status: DC
  Administered 2020-11-10: 2 [IU] via SUBCUTANEOUS
  Administered 2020-11-10 – 2020-11-11 (×3): 1 [IU] via SUBCUTANEOUS
  Administered 2020-11-11: 2 [IU] via SUBCUTANEOUS
  Administered 2020-11-11: 1 [IU] via SUBCUTANEOUS
  Administered 2020-11-12: 2 [IU] via SUBCUTANEOUS
  Administered 2020-11-12: 3 [IU] via SUBCUTANEOUS

## 2020-11-10 MED ORDER — ADULT MULTIVITAMIN W/MINERALS CH
1.0000 | ORAL_TABLET | Freq: Every day | ORAL | Status: DC
  Administered 2020-11-10 – 2020-11-12 (×3): 1 via ORAL
  Filled 2020-11-10 (×3): qty 1

## 2020-11-10 NOTE — Consult Note (Addendum)
[place]  [phone] [phone]   [place]  Referral MD: Dr. Terrial Pham  Reason for Referral: Suspected [facility]  HPI: Deborah Pham is a 55 year old female with a past medical history significant for cirrhosis, type 2 diabetes mellitus, osteoarthritis, hyponatremia, and recent diagnosis of suspected hepatocellular carcinoma.  She presented to the emergency room after an episode of hypoglycemia.  Recent hospital records reviewed.  She was hospitalized from 11/03/2020 through 11/05/2020 for decompensated hepatic cirrhosis.  She had an MRI of the abdomen with and without contrast performed 11/03/2020 which showed cirrhotic morphology of the liver, no focal liver lesion or suspicious arterial contrast enhancement identified although exam was limited by breath motion artifact.  She was found to have stigmata of portal hypertension including splenomegaly, varices, and small volume ascites on the MRI.  She then had a CT of the abdomen/pelvis with contrast performed 11/04/2020 which again showed advanced cirrhotic changes and a mass seen near the caudate lobe measuring 4.7 x 4.3 cm highly suspicious for HCC.  Additionally, CT demonstrates scattered indeterminate lucent bone lesions which could represent metastatic disease. An AFP was obtained on 11/04/2020 which was significantly elevated at 2899.  It appears that the patient was going to have outpatient follow-up for the suspicious HCC including a PET CT scan.  This has not yet been performed.  The patient was initially feeling well following hospital discharge but then developed more difficulty getting out of bed, walking, and speaking.  She was also having anorexia and symptoms of dysphagia which she thinks contributed to her weakness.  Despite poor p.o. intake, she remained on her diabetes medications.  She was found to have a blood sugar in the 30s when EMS arrived.  Labs on admission showed  a WBC of 4.5, hemoglobin 11.3, platelets 22,000.  Her sodium was 122, glucose 48, BUN 26, total protein 5.6, albumin 1.9, AST 202, ALT 64, alk phos 350, and T bili 9.3.  She had a PT/INR performed this morning which was 18.0 and 1.6. MELD-Na score 20.  A CT of the head without contrast was performed on admission which showed an unremarkable CT appearance of the brain as well as multiple small lucent skull lesions which are indeterminate but raise concern for metastatic disease.  The patient was seen today with the assistance of a Spanish interpreter.  Her husband was at the bedside.  The patient continues to be confused and drowsy and participated minimally and our conversation.  History obtained from her husband.  The patient was doing well this past weekend but when she increased the dose of her diuretics, she started to have more weakness.  Diuretics were then placed on hold and she is having increased abdominal distention and lower extremity edema.  She has had anorexia.  She is reportedly having decreased bowel movements.  Her urine is also dark.  The patient has not reported any chest discomfort, shortness of breath, abdominal pain, nausea, vomiting, bleeding.  The patient is married.  She has 2 adult children.  Denies history of alcohol and tobacco use.  Denies family history of liver disease and malignancy.  Medical oncology was asked see the patient for recommendations regarding her suspected [place].   Past Medical History:  Diagnosis Date  . Diabetes mellitus without complication ([place])   . Fatty liver   . Gastric varices   . NASH (nonalcoholic steatohepatitis)   . Obesity   :  Past Surgical History:  Procedure Laterality  Date  . ESOPHAGOGASTRODUODENOSCOPY    :  Current Facility-Administered Medications  Medication Dose Route Frequency Provider Last Rate Last Admin  . acetaminophen (TYLENOL) tablet 650 mg  650 mg Oral Q6H PRN Deborah Payment, MD       Or  . acetaminophen (TYLENOL)  suppository 650 mg  650 mg Rectal Q6H PRN Deborah Payment, MD      . carvedilol (COREG) tablet 3.125 mg  3.125 mg Oral BID WC Deborah Pham, Vasilios, MD      . furosemide (LASIX) tablet 80 mg  80 mg Oral Daily Deborah Mulligan, MD      . lactulose (CHRONULAC) 10 GM/15ML solution 10 g  10 g Oral BID Deborah Payment, MD   10 g at 11/09/20 2038  . polyethylene glycol (MIRALAX / GLYCOLAX) packet 17 g  17 g Oral Daily PRN Deborah Payment, MD      . spironolactone (ALDACTONE) tablet 200 mg  200 mg Oral Daily Deborah Mulligan, MD         Allergies  Allergen Reactions  . Lisinopril Cough  :  Family History  Problem Relation Age of Onset  . Liver disease Father   . Alcoholism Father   . Diabetes Mother   . Colon cancer Neg Hx   . Breast cancer Neg Hx   . Stomach cancer Neg Hx   . Throat cancer Neg Hx   . Pancreatic cancer Neg Hx   :  Social History   Socioeconomic History  . Marital status: Married    Spouse name: Not on file  . Number of children: 2  . Years of education: Not on file  . Highest education level: 12th grade  Occupational History  . Occupation: housewife  Tobacco Use  . Smoking status: Never Smoker  . Smokeless tobacco: Never Used  Vaping Use  . Vaping Use: Never used  Substance and Sexual Activity  . Alcohol use: No  . Drug use: No  . Sexual activity: Yes    Partners: Male    Birth control/protection: Post-menopausal  Other Topics Concern  . Not on file  Social History Narrative  . Not on file   Social Determinants of Health   Financial Resource Strain: Not on file  Food Insecurity: Not on file  Transportation Needs: No Transportation Needs  . Lack of Transportation (Medical): No  . Lack of Transportation (Non-Medical): No  Physical Activity: Not on file  Stress: Not on file  Social Connections: Not on file  Intimate Partner Violence: Not on file  :  Review of Systems: Unable to obtain.  Exam: Patient Vitals for the past 24 hrs:  BP Temp Temp src  Pulse Resp SpO2  11/10/20 0825 (!) 89/51 98 F (36.7 C) Oral (!) 118 15 100 %  11/09/20 2302 110/63 97.7 F (36.5 C) Oral (!) 109 19 98 %  11/09/20 2021 (!) 102/51 98 F (36.7 C) Oral (!) 108 20 97 %  11/09/20 1800 112/69 - - 96 (!) 24 94 %  11/09/20 1715 120/74 - - (!) 107 (!) 22 96 %  11/09/20 1700 126/66 - - (!) 110 (!) 22 97 %  11/09/20 1615 116/68 - - (!) 110 (!) 23 98 %  11/09/20 1338 (!) 116/58 - - (!) 106 20 96 %  11/09/20 1300 104/65 - - 98 (!) 21 97 %  11/09/20 1123 140/83 97.9 F (36.6 C) - 99 18 93 %    General: Opens eyes intermittently, somnolent, intermittently answers yes/no questions.  Restless  in the bed. Eyes: Scleral icterus present ENT: No thrush or mucositis..     Lymphatics:  Negative cervical, supraclavicular or axillary adenopathy.   Respiratory: Diminished breath sounds, poor inspiratory effort Cardiovascular:  Regular rate and rhythm, S1/S2, without murmur, rub or gallop.  1+ bilateral lower extremity edema. GI: Obese, soft, nontender, positive bowel sounds, unable to appreciate HSM. Skin exam was without echymosis, petichae.   Neuro: Somnolent, able to tell me her first name.  She cannot tell me the year or or where she is.  Does not follow commands.  Lab Results  Component Value Date   WBC 5.3 11/10/2020   HGB 10.7 (L) 11/10/2020   HCT 28.7 (L) 11/10/2020   PLT 17 (LL) 11/10/2020   GLUCOSE 43 (LL) 11/10/2020   CHOL 208 (H) 11/03/2020   TRIG 202 (H) 11/03/2020   HDL <10 (L) 11/03/2020   LDLCALC NOT CALCULATED 11/03/2020   ALT 70 (H) 11/10/2020   AST 208 (H) 11/10/2020   NA 123 (L) 11/10/2020   K 4.3 11/10/2020   CL 87 (L) 11/10/2020   CREATININE 0.88 11/10/2020   BUN 23 (H) 11/10/2020   CO2 26 11/10/2020    CT Head Wo Contrast  Result Date: 11/09/2020 CLINICAL DATA:  Altered mental status. History of cirrhosis with suspicious liver lesion and indeterminate bone lesions on a recent abdominal CT. EXAM: CT HEAD WITHOUT CONTRAST TECHNIQUE:  Contiguous axial images were obtained from the base of the skull through the vertex without intravenous contrast. COMPARISON:  None. FINDINGS: Brain: There is no evidence of an acute infarct, intracranial hemorrhage, mass, midline shift, or extra-axial fluid collection. The ventricles and sulci are normal. Vascular: No hyperdense vessel. Skull: No fracture. At least 7 scattered small lucent skull lesions with the largest measuring 1.4 cm near the frontoparietal vertex on the right. Sinuses/Orbits: Paranasal sinuses and mastoid air cells are clear. Unremarkable orbits Other: None. IMPRESSION: 1. Unremarkable CT appearance of the brain. 2. Multiple small lucent skull lesions which are indeterminate but raise concern for metastatic disease. Electronically Signed   By: Logan Bores M.D.   On: 11/09/2020 13:11   MR ABDOMEN W WO CONTRAST  Result Date: 11/03/2020 CLINICAL DATA:  Cirrhosis, worsening abdominal swelling, elevated AFP, rule out hepatoma EXAM: MRI ABDOMEN WITHOUT AND WITH CONTRAST TECHNIQUE: Multiplanar multisequence MR imaging of the abdomen was performed both before and after the administration of intravenous contrast. CONTRAST:  75m GADAVIST GADOBUTROL 1 MMOL/ML IV SOLN COMPARISON:  CT abdomen pelvis, 08/05/2020 FINDINGS: Examination of the abdomen is generally somewhat limited by breath motion artifact throughout. Lower chest: Trace bilateral pleural effusions. Hepatobiliary: Coarse, nodular, cirrhotic morphology of the liver. No focal liver lesion or suspicious arterial contrast enhancement; assessment limited by breath motion artifact. No gallstones, gallbladder wall thickening, or biliary dilatation. Pancreas: Unchanged 9 mm cystic lesion of the anterior pancreatic neck (series 8, image 22). No pancreatic ductal dilatation or surrounding inflammatory changes. Spleen: Splenomegaly, maximum coronal span 16.9 cm. Adrenals/Urinary Tract: Adrenal glands are unremarkable. Kidneys are normal, without  renal calculi, solid lesion, or hydronephrosis. Stomach/Bowel: Stomach is within normal limits. No evidence of bowel wall thickening, distention, or inflammatory changes. Vascular/Lymphatic: Gastroesophageal and splenorenal varices. Evaluation for patency of the portal vein is substantially limited by breath motion artifact. Unchanged prominent celiac axis, gastrohepatic ligament, and retroperitoneal lymph nodes. Other: No abdominal wall hernia or abnormality. Small volume ascites throughout the abdomen. Anasarca. Musculoskeletal: No acute or significant osseous findings. IMPRESSION: 1. Examination is generally limited by breath  motion artifact, in particular assessment of the portal vein and assessment for focal liver lesions are limited. 2. Cirrhotic morphology of the liver. No focal liver lesion or suspicious arterial contrast enhancement identified within the limitations of the exam. 3. Stigmata of portal hypertension including splenomegaly, varices, and small volume ascites. 4. Unchanged 9 mm cystic lesion of the anterior pancreatic neck, nonspecific and may reflect a small IPMN or sequelae of prior pancreatitis. Recommend follow-up pancreatic protocol MRI at 1 year to establish initial 5 years of stability if not otherwise imaged. This recommendation follows ACR white paper recommendations for management of incidental pancreatic cystic lesions. 5. Anasarca and trace bilateral pleural effusions. Electronically Signed   By: Eddie Candle M.D.   On: 11/03/2020 16:13   CT ABDOMEN PELVIS W CONTRAST  Result Date: 11/04/2020 CLINICAL DATA:  Cirrhosis and elevated AFP. Evaluate for possible HCC. EXAM: CT ABDOMEN AND PELVIS WITH CONTRAST TECHNIQUE: Multidetector CT imaging of the abdomen and pelvis was performed using the standard protocol following bolus administration of intravenous contrast. CONTRAST:  12m OMNIPAQUE IOHEXOL 300 MG/ML  SOLN COMPARISON:  MRI 11/03/2020 FINDINGS: Lower chest: The heart is within  normal limits in size. No pericardial effusion. Small right pleural effusion with minimal overlying atelectasis. Hepatobiliary: Advanced cirrhotic changes involving the liver with portal venous hypertension, portal venous collaterals, splenomegaly, ascites and esophageal varices. Unfortunately, no early arterial phase sequence was performed. However, when looking at the prior MRI examination I think there is an area of heterogeneous early arterial phase enhancement in the central aspect of segment 5 near the caudate lobe. This is best seen on the subtraction images at 25 seconds (image 23, 24 in 25 of series 20). This measures approximately 4.7 x 4.3 cm and is also diffusion positive and highly suspicious for HCC. Gallbladder wall thickening likely due to the patient's cirrhosis and ascites. Normal caliber and course of the common bile duct. Pancreas: No mass, inflammation or ductal dilatation. Spleen: Stable splenomegaly. No splenic lesions. Large splenorenal shunt noted. Adrenals/Urinary Tract: The adrenal glands and kidneys are unremarkable. No worrisome renal lesions or hydronephrosis. Moderate renal cortical scarring changes involving the left kidney. The bladder is unremarkable. Stomach/Bowel: The stomach, duodenum, small bowel and colon are grossly normal. No acute inflammatory changes, mass lesions or obstructive findings. The terminal ileum is normal. The appendix is normal. Scattered colonic diverticulosis without findings for acute diverticulitis. Vascular/Lymphatic: The aorta and branch vessels are patent. No atherosclerotic calcifications. The major venous structures are patent. Moderate narrowing of the main portal vein but no thrombus or occlusion. Reproductive: The uterus and ovaries are unremarkable. Other: Moderate volume abdominal/pelvic ascites. There is also mesenteric and omental edema and diffuse body wall edema. Musculoskeletal: Scattered indeterminate lucent bone lesions. Could not exclude  metastatic disease. IMPRESSION: 1. Based on the prior MRI I think findings are highly suspicious for hepatocellular carcinoma involving the central aspect of segment 5 near the caudate lobe and IVC. Please see above discussion. PET-CT may be helpful for further evaluation and also to evaluate the bone lesions. 2. Advanced cirrhotic changes involving the liver with portal venous hypertension, portal venous collaterals, esophageal varices, splenomegaly and ascites. Electronically Signed   By: PMarijo SanesM.D.   On: 11/04/2020 20:08   ECHOCARDIOGRAM COMPLETE  Result Date: 11/04/2020    ECHOCARDIOGRAM REPORT   Patient Name:   CECILIA FDeatra InaDate of Exam: 11/04/2020 Medical Rec #:  [id number]  Height:       59.0 in Accession #:    [id number]              Weight:       228.0 lb Date of Birth:  Jan 01, 1966               BSA:          1.950 m Patient Age:    24 years                BP:           93/57 mmHg Patient Gender: F                       HR:           89 bpm. Exam Location:  Inpatient Procedure: 2D Echo, Cardiac Doppler and Color Doppler Indications:    Anasarca  History:        Patient has no prior history of Echocardiogram examinations.  Sonographer:    Merrie Roof RDCS Referring Phys: [id number] [place]  1. Left ventricular ejection fraction, by estimation, is 60 to 65%. The left ventricle has normal function. The left ventricle has no regional wall motion abnormalities. There is mild left ventricular hypertrophy. Left ventricular diastolic parameters are consistent with Grade I diastolic dysfunction (impaired relaxation).  2. Right ventricular systolic function is normal. The right ventricular size is normal.  3. Left atrial size was mildly dilated.  4. Right atrial size was mildly dilated.  5. The mitral valve is normal in structure. Trivial mitral valve regurgitation. No evidence of mitral stenosis.  6. The aortic valve is tricuspid. Aortic valve regurgitation is  not visualized. No aortic stenosis is present.  7. Increased flow velocities may be secondary to anemia, thyrotoxicosis, hyperdynamic or high flow state. FINDINGS  Left Ventricle: Left ventricular ejection fraction, by estimation, is 60 to 65%. The left ventricle has normal function. The left ventricle has no regional wall motion abnormalities. The left ventricular internal cavity size was normal in size. There is  mild left ventricular hypertrophy. Left ventricular diastolic parameters are consistent with Grade I diastolic dysfunction (impaired relaxation). Right Ventricle: The right ventricular size is normal. No increase in right ventricular wall thickness. Right ventricular systolic function is normal. Left Atrium: Left atrial size was mildly dilated. Right Atrium: Right atrial size was mildly dilated. Pericardium: There is no evidence of pericardial effusion. Mitral Valve: The mitral valve is normal in structure. Trivial mitral valve regurgitation. No evidence of mitral valve stenosis. Tricuspid Valve: The tricuspid valve is normal in structure. Tricuspid valve regurgitation is not demonstrated. No evidence of tricuspid stenosis. Aortic Valve: The aortic valve is tricuspid. Aortic valve regurgitation is not visualized. No aortic stenosis is present. Aortic valve mean gradient measures 9.0 mmHg. Aortic valve peak gradient measures 18.0 mmHg. Aortic valve area, by VTI measures 2.47  cm. Pulmonic Valve: The pulmonic valve was normal in structure. Pulmonic valve regurgitation is not visualized. No evidence of pulmonic stenosis. Aorta: The aortic root is normal in size and structure. Venous: The inferior vena cava was not well visualized. IAS/Shunts: The interatrial septum was not well visualized.  LEFT VENTRICLE PLAX 2D LVIDd:         3.50 cm  Diastology LVIDs:         2.40 cm  LV e' medial:    7.07 cm/s LV PW:         1.00 cm  LV E/e' medial:  10.9 LV IVS:        1.00 cm  LV e' lateral:   10.10 cm/s LVOT diam:      1.90 cm  LV E/e' lateral: 7.6 LV SV:         98 LV SV Index:   50 LVOT Area:     2.84 cm  RIGHT VENTRICLE RV Basal diam:  3.70 cm LEFT ATRIUM             Index       RIGHT ATRIUM           Index LA diam:        4.00 cm 2.05 cm/m  RA Area:     18.20 cm LA Vol (A2C):   64.4 ml 33.03 ml/m RA Volume:   55.50 ml  28.46 ml/m LA Vol (A4C):   71.1 ml 36.46 ml/m LA Biplane Vol: 68.7 ml 35.23 ml/m  AORTIC VALVE AV Area (Vmax):    2.38 cm AV Area (Vmean):   2.38 cm AV Area (VTI):     2.47 cm AV Vmax:           212.00 cm/s AV Vmean:          136.000 cm/s AV VTI:            0.398 m AV Peak Grad:      18.0 mmHg AV Mean Grad:      9.0 mmHg LVOT Vmax:         178.00 cm/s LVOT Vmean:        114.000 cm/s LVOT VTI:          0.347 m LVOT/AV VTI ratio: 0.87  AORTA Ao Root diam: 2.70 cm Ao Asc diam:  3.00 cm MITRAL VALVE MV Area (PHT): 3.08 cm     SHUNTS MV Decel Time: 246 msec     Systemic VTI:  0.35 m MV E velocity: 77.10 cm/s   Systemic Diam: 1.90 cm MV A velocity: 101.00 cm/s MV E/A ratio:  0.76 Cherlynn Kaiser MD Electronically signed by Cherlynn Kaiser MD Signature Date/Time: 11/04/2020/2:31:56 PM    Final    IR ABDOMEN US LIMITED  Result Date: 11/04/2020 CLINICAL DATA:  History of cirrhosis now with concern for intra-abdominal ascites. Please perform ascites search ultrasound and ultrasound-guided paracentesis as indicated. EXAM: LIMITED ABDOMEN ULTRASOUND FOR ASCITES TECHNIQUE: Limited ultrasound survey for ascites was performed in all four abdominal quadrants. COMPARISON:  Abdominal MRI-11/03/2020; CT abdomen and pelvis-08/05/2020 FINDINGS: Sonographic evaluation of the abdomen demonstrates a trace amount of intra-abdominal ascites, too small to allow for safe ultrasound-guided paracentesis given patient's body habitus. No paracentesis attempted. IMPRESSION: Trace amount of intra-abdominal ascites, too small to allow for safe ultrasound-guided paracentesis given patient's body habitus. No paracentesis attempted.  Electronically Signed   By: Sandi Mariscal M.D.   On: 11/04/2020 10:50     CT Head Wo Contrast  Result Date: 11/09/2020 CLINICAL DATA:  Altered mental status. History of cirrhosis with suspicious liver lesion and indeterminate bone lesions on a recent abdominal CT. EXAM: CT HEAD WITHOUT CONTRAST TECHNIQUE: Contiguous axial images were obtained from the base of the skull through the vertex without intravenous contrast. COMPARISON:  None. FINDINGS: Brain: There is no evidence of an acute infarct, intracranial hemorrhage, mass, midline shift, or extra-axial fluid collection. The ventricles and sulci are normal. Vascular: No hyperdense vessel. Skull: No fracture. At least 7 scattered small lucent skull lesions with the largest measuring 1.4 cm near the  frontoparietal vertex on the right. Sinuses/Orbits: Paranasal sinuses and mastoid air cells are clear. Unremarkable orbits Other: None. IMPRESSION: 1. Unremarkable CT appearance of the brain. 2. Multiple small lucent skull lesions which are indeterminate but raise concern for metastatic disease. Electronically Signed   By: Logan Bores M.D.   On: 11/09/2020 13:11   MR ABDOMEN W WO CONTRAST  Result Date: 11/03/2020 CLINICAL DATA:  Cirrhosis, worsening abdominal swelling, elevated AFP, rule out hepatoma EXAM: MRI ABDOMEN WITHOUT AND WITH CONTRAST TECHNIQUE: Multiplanar multisequence MR imaging of the abdomen was performed both before and after the administration of intravenous contrast. CONTRAST:  58m GADAVIST GADOBUTROL 1 MMOL/ML IV SOLN COMPARISON:  CT abdomen pelvis, 08/05/2020 FINDINGS: Examination of the abdomen is generally somewhat limited by breath motion artifact throughout. Lower chest: Trace bilateral pleural effusions. Hepatobiliary: Coarse, nodular, cirrhotic morphology of the liver. No focal liver lesion or suspicious arterial contrast enhancement; assessment limited by breath motion artifact. No gallstones, gallbladder wall thickening, or biliary  dilatation. Pancreas: Unchanged 9 mm cystic lesion of the anterior pancreatic neck (series 8, image 22). No pancreatic ductal dilatation or surrounding inflammatory changes. Spleen: Splenomegaly, maximum coronal span 16.9 cm. Adrenals/Urinary Tract: Adrenal glands are unremarkable. Kidneys are normal, without renal calculi, solid lesion, or hydronephrosis. Stomach/Bowel: Stomach is within normal limits. No evidence of bowel wall thickening, distention, or inflammatory changes. Vascular/Lymphatic: Gastroesophageal and splenorenal varices. Evaluation for patency of the portal vein is substantially limited by breath motion artifact. Unchanged prominent celiac axis, gastrohepatic ligament, and retroperitoneal lymph nodes. Other: No abdominal wall hernia or abnormality. Small volume ascites throughout the abdomen. Anasarca. Musculoskeletal: No acute or significant osseous findings. IMPRESSION: 1. Examination is generally limited by breath motion artifact, in particular assessment of the portal vein and assessment for focal liver lesions are limited. 2. Cirrhotic morphology of the liver. No focal liver lesion or suspicious arterial contrast enhancement identified within the limitations of the exam. 3. Stigmata of portal hypertension including splenomegaly, varices, and small volume ascites. 4. Unchanged 9 mm cystic lesion of the anterior pancreatic neck, nonspecific and may reflect a small IPMN or sequelae of prior pancreatitis. Recommend follow-up pancreatic protocol MRI at 1 year to establish initial 5 years of stability if not otherwise imaged. This recommendation follows ACR white paper recommendations for management of incidental pancreatic cystic lesions. 5. Anasarca and trace bilateral pleural effusions. Electronically Signed   By: AEddie CandleM.D.   On: 11/03/2020 16:13   CT ABDOMEN PELVIS W CONTRAST  Result Date: 11/04/2020 CLINICAL DATA:  Cirrhosis and elevated AFP. Evaluate for possible HCC. EXAM: CT  ABDOMEN AND PELVIS WITH CONTRAST TECHNIQUE: Multidetector CT imaging of the abdomen and pelvis was performed using the standard protocol following bolus administration of intravenous contrast. CONTRAST:  1031mOMNIPAQUE IOHEXOL 300 MG/ML  SOLN COMPARISON:  MRI 11/03/2020 FINDINGS: Lower chest: The heart is within normal limits in size. No pericardial effusion. Small right pleural effusion with minimal overlying atelectasis. Hepatobiliary: Advanced cirrhotic changes involving the liver with portal venous hypertension, portal venous collaterals, splenomegaly, ascites and esophageal varices. Unfortunately, no early arterial phase sequence was performed. However, when looking at the prior MRI examination I think there is an area of heterogeneous early arterial phase enhancement in the central aspect of segment 5 near the caudate lobe. This is best seen on the subtraction images at 25 seconds (image 23, 24 in 25 of series 20). This measures approximately 4.7 x 4.3 cm and is also diffusion positive and highly suspicious for HCC. Gallbladder  wall thickening likely due to the patient's cirrhosis and ascites. Normal caliber and course of the common bile duct. Pancreas: No mass, inflammation or ductal dilatation. Spleen: Stable splenomegaly. No splenic lesions. Large splenorenal shunt noted. Adrenals/Urinary Tract: The adrenal glands and kidneys are unremarkable. No worrisome renal lesions or hydronephrosis. Moderate renal cortical scarring changes involving the left kidney. The bladder is unremarkable. Stomach/Bowel: The stomach, duodenum, small bowel and colon are grossly normal. No acute inflammatory changes, mass lesions or obstructive findings. The terminal ileum is normal. The appendix is normal. Scattered colonic diverticulosis without findings for acute diverticulitis. Vascular/Lymphatic: The aorta and branch vessels are patent. No atherosclerotic calcifications. The major venous structures are patent. Moderate  narrowing of the main portal vein but no thrombus or occlusion. Reproductive: The uterus and ovaries are unremarkable. Other: Moderate volume abdominal/pelvic ascites. There is also mesenteric and omental edema and diffuse body wall edema. Musculoskeletal: Scattered indeterminate lucent bone lesions. Could not exclude metastatic disease. IMPRESSION: 1. Based on the prior MRI I think findings are highly suspicious for hepatocellular carcinoma involving the central aspect of segment 5 near the caudate lobe and IVC. Please see above discussion. PET-CT may be helpful for further evaluation and also to evaluate the bone lesions. 2. Advanced cirrhotic changes involving the liver with portal venous hypertension, portal venous collaterals, esophageal varices, splenomegaly and ascites. Electronically Signed   By: Marijo Sanes M.D.   On: 11/04/2020 20:08   ECHOCARDIOGRAM COMPLETE  Result Date: 11/04/2020    ECHOCARDIOGRAM REPORT   Patient Name:   CECILIA FLORES Kangas Date of Exam: 11/04/2020 Medical Rec #:  [id number]               Height:       59.0 in Accession #:    [id number]              Weight:       228.0 lb Date of Birth:  [birth year]               BSA:          1.950 m Patient Age:    55 years                BP:           93/57 mmHg Patient Gender: F                       HR:           89 bpm. Exam Location:  Inpatient Procedure: 2D Echo, Cardiac Doppler and Color Doppler Indications:    Anasarca  History:        Patient has no prior history of Echocardiogram examinations.  Sonographer:    Merrie Roof RDCS Referring Phys: [id number] [place]  1. Left ventricular ejection fraction, by estimation, is 60 to 65%. The left ventricle has normal function. The left ventricle has no regional wall motion abnormalities. There is mild left ventricular hypertrophy. Left ventricular diastolic parameters are consistent with Grade I diastolic dysfunction (impaired relaxation).  2. Right ventricular  systolic function is normal. The right ventricular size is normal.  3. Left atrial size was mildly dilated.  4. Right atrial size was mildly dilated.  5. The mitral valve is normal in structure. Trivial mitral valve regurgitation. No evidence of mitral stenosis.  6. The aortic valve is tricuspid. Aortic valve regurgitation is not visualized. No aortic stenosis is present.  7. Increased flow velocities may  be secondary to anemia, thyrotoxicosis, hyperdynamic or high flow state. FINDINGS  Left Ventricle: Left ventricular ejection fraction, by estimation, is 60 to 65%. The left ventricle has normal function. The left ventricle has no regional wall motion abnormalities. The left ventricular internal cavity size was normal in size. There is  mild left ventricular hypertrophy. Left ventricular diastolic parameters are consistent with Grade I diastolic dysfunction (impaired relaxation). Right Ventricle: The right ventricular size is normal. No increase in right ventricular wall thickness. Right ventricular systolic function is normal. Left Atrium: Left atrial size was mildly dilated. Right Atrium: Right atrial size was mildly dilated. Pericardium: There is no evidence of pericardial effusion. Mitral Valve: The mitral valve is normal in structure. Trivial mitral valve regurgitation. No evidence of mitral valve stenosis. Tricuspid Valve: The tricuspid valve is normal in structure. Tricuspid valve regurgitation is not demonstrated. No evidence of tricuspid stenosis. Aortic Valve: The aortic valve is tricuspid. Aortic valve regurgitation is not visualized. No aortic stenosis is present. Aortic valve mean gradient measures 9.0 mmHg. Aortic valve peak gradient measures 18.0 mmHg. Aortic valve area, by VTI measures 2.47  cm. Pulmonic Valve: The pulmonic valve was normal in structure. Pulmonic valve regurgitation is not visualized. No evidence of pulmonic stenosis. Aorta: The aortic root is normal in size and structure. Venous:  The inferior vena cava was not well visualized. IAS/Shunts: The interatrial septum was not well visualized.  LEFT VENTRICLE PLAX 2D LVIDd:         3.50 cm  Diastology LVIDs:         2.40 cm  LV e' medial:    7.07 cm/s LV PW:         1.00 cm  LV E/e' medial:  10.9 LV IVS:        1.00 cm  LV e' lateral:   10.10 cm/s LVOT diam:     1.90 cm  LV E/e' lateral: 7.6 LV SV:         98 LV SV Index:   50 LVOT Area:     2.84 cm  RIGHT VENTRICLE RV Basal diam:  3.70 cm LEFT ATRIUM             Index       RIGHT ATRIUM           Index LA diam:        4.00 cm 2.05 cm/m  RA Area:     18.20 cm LA Vol (A2C):   64.4 ml 33.03 ml/m RA Volume:   55.50 ml  28.46 ml/m LA Vol (A4C):   71.1 ml 36.46 ml/m LA Biplane Vol: 68.7 ml 35.23 ml/m  AORTIC VALVE AV Area (Vmax):    2.38 cm AV Area (Vmean):   2.38 cm AV Area (VTI):     2.47 cm AV Vmax:           212.00 cm/s AV Vmean:          136.000 cm/s AV VTI:            0.398 m AV Peak Grad:      18.0 mmHg AV Mean Grad:      9.0 mmHg LVOT Vmax:         178.00 cm/s LVOT Vmean:        114.000 cm/s LVOT VTI:          0.347 m LVOT/AV VTI ratio: 0.87  AORTA Ao Root diam: 2.70 cm Ao Asc diam:  3.00 cm MITRAL VALVE MV Area (  PHT): 3.08 cm     SHUNTS MV Decel Time: 246 msec     Systemic VTI:  0.35 m MV E velocity: 77.10 cm/s   Systemic Diam: 1.90 cm MV A velocity: 101.00 cm/s MV E/A ratio:  0.76 Cherlynn Kaiser MD Electronically signed by Cherlynn Kaiser MD Signature Date/Time: 11/04/2020/2:31:56 PM    Final    IR ABDOMEN US LIMITED  Result Date: 11/04/2020 CLINICAL DATA:  History of cirrhosis now with concern for intra-abdominal ascites. Please perform ascites search ultrasound and ultrasound-guided paracentesis as indicated. EXAM: LIMITED ABDOMEN ULTRASOUND FOR ASCITES TECHNIQUE: Limited ultrasound survey for ascites was performed in all four abdominal quadrants. COMPARISON:  Abdominal MRI-11/03/2020; CT abdomen and pelvis-08/05/2020 FINDINGS: Sonographic evaluation of the abdomen demonstrates  a trace amount of intra-abdominal ascites, too small to allow for safe ultrasound-guided paracentesis given patient's body habitus. No paracentesis attempted. IMPRESSION: Trace amount of intra-abdominal ascites, too small to allow for safe ultrasound-guided paracentesis given patient's body habitus. No paracentesis attempted. Electronically Signed   By: Sandi Mariscal M.D.   On: 11/04/2020 10:50   Assessment and Plan:  1.  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 -11/04/2020 CT abdomen/pelvis with contrast-based on prior MRI, findings are highly suspicious for hepatocellular carcinoma involving the central aspect of segment 5 near the caudate lobe and IVC (area measures 4.7 x 4.3 cm).  Additionally there are scattered indeterminate lucent bone lesions and cannot exclude metastatic disease. -11/04/2020 AFP 2899.0 -11/09/2020 CT head without contrast-unremarkable CT appearance of the brain, multiple small lucent skull lesions which are indeterminate but raise concern for metastatic disease. 2.  Decompensated NASH cirrhosis 3.  Hepatic encephalopathy 4.  Coagulopathy 5.  Thrombocytopenia 6.  Normocytic anemia 7.  Hyponatremia 8.  Hyperbilirubinemia and transaminitis secondary to cirrhosis 9.  Dysphagia  Ms. Capwell has imaging findings concerning for metastatic hepatocellular carcinoma.  She had an elevated AFP as well.  She has decompensated cirrhosis with hepatic encephalopathy.  Imaging findings were discussed with the patient's husband.  We discussed findings are concerning for hepatocellular carcinoma and that she has bone lesions which are also suspicious for cancer.  Recommend additional work-up  including a CT of the chest with contrast as well as a bone scan.  Discussed with the patient's husband that she would not be a surgical candidate or candidate for hepatic directed therapy due to her cirrhosis and possible metastatic disease.  She may be considered for immunotherapy if her hepatic encephalopathy and Performa status improved.  Recommend outpatient follow-up at the cancer center to discuss treatment options.  However, if her hepatic encephalopathy and performance status did not improve, recommend hospice/comfort measures.  The patient's husband also inquired about assistance with disability for the patient.  Will request social work referral.  Recommendations: 1.  Management of hepatic encephalopathy per GI. 2.  CT of the chest with contrast and bone scan. 3.  Recommend outpatient follow-up at the cancer center to discuss treatment options.  If no improvement in hepatic encephalopathy and performance status, recommend hospice and comfort measures. 4.  Recommend social work consult to help with disability. 5.  Check fibrinogen level 6.  Ionized calcium level  Thank you for this referral.   Mikey Bussing, DNP, AGPCNP-BC, AOCNP  Ms. Costillo was  interviewed and examined.  I reviewed the imaging studies and medical chart.  Her husband was at the bedside when we saw her this afternoon.  We saw her with the aid of a Spanish interpreter.  She has altered mental status secondary to hepatic encephalopathy and was not able to follow commands or participate in discussion today.  She has advanced cirrhosis.  She appears to have hepatocellular carcinoma, potentially metastatic to the bones.  She has multiple comorbid conditions including advanced cirrhosis, severe thrombocytopenia, and diabetes.  I suspect the severe thrombocytopenia is related to cirrhosis with portal hypertension, and potentially tumor related DIC.  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  She will not  be a surgical candidate secondary to the advanced stage, comorbid conditions, and her poor performance status.  We could consider treatment with immunotherapy if her clinical status improves with treatment of the hepatic encephalopathy.  We will recommend additional staging evaluation to include a bone scan and chest CT.  We will check an ionized calcium level as hypercalcemia could be contributing to the altered mental status.  I do not recommend biopsy of the liver lesion.  There is a very high likelihood of an [place] diagnosis with the cirrhosis, imaging findings and elevated AFP.  We discussed hospice care with her husband.  I was present for greater than 50% of today's visit.  I performed medical decision making.

## 2020-11-10 NOTE — Evaluation (Signed)
Physical Therapy Evaluation Patient Details Name: Deborah Pham MRN: [id number] DOB: Jan 11, 1966 Today's Date: 11/10/2020   History of Present Illness  55 y.o. female with a pertinent PMH of cirrhosis, type 2 diabetes, osteoarthritis, hyponatremia, and a recent admission to [facility] for decompensated cirrhosis in the setting of possible metastatic hepatocellular carcinoma who presented 11/09/20 to [facility] after an episode of hypoglycemia. Acute encephalopathy  Clinical Impression   Pt admitted with above diagnosis. Per husband (via interpreter), patient was ambulating independently 5-7 days ago. Has not stood since 3/14. Today was unable to stand with assist of PT and pt's husband. Husband aware pt currently needs 24 hour care, however states he works and doesn't know yet how they will make that happen. Noted Palliative Consult pending. Pt currently with functional limitations due to the deficits listed below (see PT Problem List). Pt may benefit from skilled PT to increase their independence and safety with mobility to allow discharge to the venue listed below.       Follow Up Recommendations Supervision/Assistance - 24 hour;Home health PT    Equipment Recommendations  3in1 (PT);Wheelchair (measurements PT);Wheelchair cushion (measurements PT);Hospital bed;Other (comment) (hoyer lift) ; medical transport home   Recommendations for Other Services OT consult     Precautions / Restrictions Precautions Precautions: Fall      Mobility  Bed Mobility Overal bed mobility: Needs Assistance Bed Mobility: Rolling;Sidelying to Sit;Sit to Sidelying Rolling: Supervision (with rails) Sidelying to sit: Mod assist;HOB elevated     Sit to sidelying: Mod assist;+2 for physical assistance General bed mobility comments: pt following gestures more than verbal instructions    Transfers Overall transfer level: Needs assistance Equipment used: 2 person hand held assist             General  transfer comment: pt began to initiate standing and immediately returned to sitting EOB, husband fearful for her to attempt again and asked for her to lie down  Ambulation/Gait                Stairs            Wheelchair Mobility    Modified Rankin (Stroke Patients Only)       Balance Overall balance assessment: Needs assistance Sitting-balance support: Feet supported;No upper extremity supported Sitting balance-Leahy Scale: Poor Sitting balance - Comments: slight lean to left                                     Pertinent Vitals/Pain Pain Assessment: Faces Faces Pain Scale: No hurt    Home Living Family/patient expects to be discharged to:: Private residence Living Arrangements: Spouse/significant other Available Help at Discharge: Family;Available PRN/intermittently Type of Home: House Home Access: Stairs to enter   [employer] of Steps: 2 Home Layout: One level Home Equipment: None      Prior Function Level of Independence: Independent         Comments: prior to 5 days ago, was independent with walking     Hand Dominance        Extremity/Trunk Assessment   Upper Extremity Assessment Upper Extremity Assessment: Generalized weakness    Lower Extremity Assessment Lower Extremity Assessment: Generalized weakness    Cervical / Trunk Assessment Cervical / Trunk Assessment: Other exceptions Cervical / Trunk Exceptions: abdomen very distended  Communication   Communication: Prefers language other than [place] (Spanish interpreter [place] via ipad)  Cognition Arousal/Alertness: Lethargic Behavior  During Therapy: Restless Overall Cognitive Status: Difficult to assess                                 General Comments: pt answers with yes/no but without consistency to her answers. Very restless, rolling and scooting up in bed repeatedly      General Comments General comments (skin integrity, edema,  etc.): Husband with difficulty allowing pt to struggle and try to accomplish task as asked by PT (providing physical assist when pt may not have required).    Exercises     Assessment/Plan    PT Assessment Patient needs continued PT services  PT Problem List Decreased strength;Decreased activity tolerance;Decreased balance;Decreased mobility;Decreased cognition;Decreased knowledge of use of DME;Decreased safety awareness;Obesity       PT Treatment Interventions DME instruction;Gait training;Stair training;Functional mobility training;Therapeutic activities;Therapeutic exercise;Balance training;Cognitive remediation;Patient/family education    PT Goals (Current goals can be found in the Care Plan section)  Acute Rehab PT Goals Patient Stated Goal: pt unable; husband wants pt to get better and be able to go home PT Goal Formulation: With family Time For Goal Achievement: 11/24/20 Potential to Achieve Goals: Poor    Frequency Min 3X/week   Barriers to discharge Decreased caregiver support husband states he works as is unsure who can be with pt when he is gone    Co-evaluation               AM-PAC PT "6 Clicks" Mobility  Outcome Measure Help needed turning from your back to your side while in a flat bed without using bedrails?: A Little Help needed moving from lying on your back to sitting on the side of a flat bed without using bedrails?: A Lot Help needed moving to and from a bed to a chair (including a wheelchair)?: Total Help needed standing up from a chair using your arms (e.g., wheelchair or bedside chair)?: Total Help needed to walk in hospital room?: Total Help needed climbing 3-5 steps with a railing? : Total 6 Click Score: 9    End of Session   Activity Tolerance: Patient limited by lethargy Patient left: in bed;with call bell/phone within reach;with bed alarm set;with family/visitor present Nurse Communication: Mobility status PT Visit Diagnosis: Muscle  weakness (generalized) (M62.81);Difficulty in walking, not elsewhere classified (R26.2);Other symptoms and signs involving the nervous system (R29.898)    Time: 2979-8921 PT Time Calculation (min) (ACUTE ONLY): 31 min   Charges:   PT Evaluation $PT Eval Low Complexity: 1 Low PT Treatments $Therapeutic Activity: 8-22 mins         Arby Barrette, PT Pager [phone]   Deborah Pham 11/10/2020, 3:49 PM

## 2020-11-10 NOTE — Consult Note (Addendum)
[facility] Consult: 8:26 AM 11/10/2020  LOS: 0 days    Referring Provider: Dr Jimmye Norman  Primary Care Physician:  [name], MD Primary Gastroenterologist:  Dr. Loletha Carrow     Reason for Consultation:  Decompensated cirrhosis.     HPI: Deborah Pham is a 55 y.o. female.  PMH obesity. NASH cirrhosis.  NIDDM.  Hyponatremia.  Thrombocytopenia. 08/2016 EGD: For variceal screening.  Grade 2 distal esophageal varices.  GOV 1 gastroesophageal varices.  Moderate portal hypertensive gastropathy throughout stomach.  Examined duodenum normal.  No active bleeding or stigmata of bleeding.  Admission 3/10 - 11/05/20 w decompensated cirrhosis, hyponatremia, ascites and AFP 2033.  Increased abdominal girth, abdominal fullness, anorexia, 18 pound weight gain from baseline.  Meld 18. 11/03/2020 MRI abdomen.  Exam limited due to artifact.  Cirrhosis without focal liver lesion.  Portal hypertension evidenced by splenomegaly, gastroesophageal and splenorenal  varices, small volume ascites.  Stable 9 mm, nonspecific cystic lesion at pancreatic neck, ?IPMN or sequela of previous pancreatitis.  Anasarca, trace bilateral effusions. 11/04/20 CTAP w contrast.  Based on previous MRI, findings suspicious for HCC at caudate lobe and IVC.  PET CT might be helpful for further eval and to evaluate for possible bone lesions.  Advance cirrhosis with portal venous hypertension, portal venous collaterals, esophageal varices, splenomegaly, ascites. 11/04/2020 abdominal ultrasound: Trace abdominal ascites too small for safe paracentesis given patient's obesity. Previous CT 07/2020 showed cirrhosis, portal hypertension, cholelithiasis, 9 mm lesion in the pancreatic body but no HCC. At discharge 3/12 plan was for IR referral to address Needles (TACE vs  RFA).  She also might qualify for liver transplant and referral to transplant center planned.  Diuretics at discharge Lasix 40, Aldactone 200 daily, 2 g sodium diet.  Patient's husband sought medical advice regarding his wife's confusion.  Not speaking in complete sentences and not making any sense.  Slurred speech, dizziness, decreased responsiveness.  Arms and legs feeling heavy.  Difficulty standing up.  Not eating.  Patient referred to ED.  Per EMS blood sugars in the 30s, treated with D50.  She had been taking her glipizide in the setting of decreased p.o. intake.  Dark urine.  Decreased BMs.  Bowel movement yesterday was brown, formed.  Ammonia 140.  T bili 9.5.  Alk phos 350.  AST/ALT 208/70.  INR 1.6.  Platelets 17.  Hb 10.7.  Normal WBCs. Na 122.  Glucose 43.  BUN slightly elevated 23, normal creatinine and normal GFR. Urine sodium <10. AFP 2899.   MELD-Na 20.   CT head: Unremarkable brain.  Multiple small, lucent lesions in the skull are indeterminate but raise concern for metastatic disease.    Patient is not able to provide history.  Her husband, although he has been helping her get to the bathroom for the last few days is not sure when she last had a bowel movement or how frequently she is having bowel movements.  No bowel movements today. Pain is confused this morning. Received 20 g Chronulac so far.  Diuretics are on hold.  Past Medical History:  Diagnosis Date  . Diabetes mellitus without complication ([place])   . Fatty liver   . Gastric varices   . NASH (nonalcoholic steatohepatitis)   . Obesity     Past Surgical History:  Procedure Laterality Date  . ESOPHAGOGASTRODUODENOSCOPY      Prior to Admission medications   Medication Sig Start Date End Date Taking? Authorizing Provider  carvedilol (COREG) 3.125 MG tablet Take 1 tablet (3.125 mg total) by mouth 2 (two) times daily with a meal. 07/19/20  Yes Danis, Estill Cotta III, MD  furosemide (LASIX) 80 MG tablet Take 1 tablet  (80 mg total) by mouth daily. 11/06/20  Yes Cato Mulligan, MD  glipiZIDE (GLUCOTROL) 10 MG tablet Take 1 tablet (10 mg total) by mouth daily before breakfast. 09/26/20 03/25/21 Yes Amponsah, Charisse March, MD  glucose blood (CONTOUR NEXT TEST) test strip Use as instructed 01/14/20  Yes [name], MD  spironolactone (ALDACTONE) 100 MG tablet Take 2 tablets (200 mg total) by mouth daily. 11/06/20  Yes Cato Mulligan, MD    Scheduled Meds: . carvedilol  3.125 mg Oral BID WC  . furosemide  80 mg Oral Daily  . lactulose  10 g Oral BID  . spironolactone  200 mg Oral Daily   Infusions:  PRN Meds: acetaminophen **OR** acetaminophen, polyethylene glycol   Allergies as of 11/09/2020 - Review Complete 11/09/2020  Allergen Reaction Noted  . Lisinopril Cough 08/07/2013    Family History  Problem Relation Age of Onset  . Liver disease Father   . Alcoholism Father   . Diabetes Mother   . Colon cancer Neg Hx   . Breast cancer Neg Hx   . Stomach cancer Neg Hx   . Throat cancer Neg Hx   . Pancreatic cancer Neg Hx     Social History   Socioeconomic History  . Marital status: Married    Spouse name: Not on file  . Number of children: 2  . Years of education: Not on file  . Highest education level: 12th grade  Occupational History  . Occupation: housewife  Tobacco Use  . Smoking status: Never Smoker  . Smokeless tobacco: Never Used  Vaping Use  . Vaping Use: Never used  Substance and Sexual Activity  . Alcohol use: No  . Drug use: No  . Sexual activity: Yes    Partners: Male    Birth control/protection: Post-menopausal  Other Topics Concern  . Not on file  Social History Narrative  . Not on file   Social Determinants of Health   Financial Resource Strain: Not on file  Food Insecurity: Not on file  Transportation Needs: No Transportation Needs  . Lack of Transportation (Medical): No  . Lack of Transportation (Non-Medical): No  Physical Activity: Not on file  Stress:  Not on file  Social Connections: Not on file  Intimate Partner Violence: Not on file    REVIEW OF SYSTEMS: Constitutional: Weakness.  See HPI ENT:  No nose bleeds Pulm: No shortness of breath or cough CV:  No palpitations, no chest pain.+LE edema GU:  No hematuria, no frequency GI: No nausea or vomiting Heme: No unusual or excessive bleeding or bruising Transfusions: None. Neuro: Somnolent, obtunded.  No seizures, no syncope.  No headaches, no peripheral tingling or numbness Derm:  No itching, no rash or sores.  Endocrine:  No sweats or chills.  No polyuria or dysuria Immunization:  vax x 2 with Pfizer COVID-19 vaccination. Travel:  None beyond local counties  in last few months.    PHYSICAL EXAM: Vital signs in last 24 hours: Vitals:   11/09/20 2302 11/10/20 0825  BP: 110/63 (!) 89/51  Pulse: (!) 109 (!) 118  Resp: 19 15  Temp: 97.7 F (36.5 C) 98 F (36.7 C)  SpO2: 98% 100%   Wt Readings from Last 3 Encounters:  11/03/20 103.4 kg  11/01/20 103.6 kg  09/26/20 94.6 kg    General: Eyes are open but she is somnolent, obtunded.  Husband is feeding her ice cream but she is not much focused on swallowing.  Obese.  Looks ill but not toxic. Head: No facial asymmetry or swelling.  No signs of head trauma. Eyes: Sclera icteric.  Somewhat pale conjunctiva. Ears: No obvious hearing deficit. Nose: No congestion or discharge Mouth: Mucosa moist, pink, clear.  Tongue midline.  Fair dentition. Neck: No JVD, no masses, no thyromegaly Lungs: No labored breathing or cough.  Reduced breath sounds but clear bilaterally. Heart: Tacky in the low 100s.  Regular.  No MRG.  S1, S2 audible. Abdomen: Obese, soft, nontender.  Active bowel sounds.  No HSM, masses, bruits, hernias..   Rectal: Deferred Musc/Skeltl: No joint redness, swelling or gross deformity Extremities:  1 plus Le edema  Neurologic: Obtunded.  Can tell me her first name.  Cannot verbalize where she is, the year, her birthdate.   Not able to follow commands.  Unable to hold arms out straight in front of her or flex wrists but no tremor or gross asterixis observed.  No nystagmus. Skin: Olive skin tone makes for difficulty discerning mild jaundice. Tattoos:  None Nodes: No cervical adenopathy Psych: Flat affect.  No agitation.  Intake/Output from previous day: 03/16 0701 - 03/17 0700 In: -  Out: 150 [Urine:150] Intake/Output this shift: No intake/output data recorded.  LAB RESULTS: Recent Labs    11/09/20 1147 11/10/20 0337  WBC 4.5 5.3  HGB 11.3* 10.7*  HCT 32.6* 28.7*  PLT 22* 17*   BMET Lab Results  Component Value Date   NA 123 (L) 11/10/2020   NA 122 (L) 11/09/2020   NA 125 (L) 11/05/2020   K 4.3 11/10/2020   K 4.4 11/09/2020   K 4.0 11/05/2020   CL 87 (L) 11/10/2020   CL 88 (L) 11/09/2020   CL 89 (L) 11/05/2020   CO2 26 11/10/2020   CO2 26 11/09/2020   CO2 26 11/05/2020   GLUCOSE 43 (LL) 11/10/2020   GLUCOSE 48 (L) 11/09/2020   GLUCOSE 82 11/05/2020   BUN 23 (H) 11/10/2020   BUN 26 (H) 11/09/2020   BUN 21 (H) 11/05/2020   CREATININE 0.88 11/10/2020   CREATININE 0.76 11/09/2020   CREATININE 1.01 (H) 11/05/2020   CALCIUM 10.3 11/10/2020   CALCIUM 10.0 11/09/2020   CALCIUM 9.4 11/05/2020   LFT Recent Labs    11/09/20 1147 11/10/20 0337  PROT 5.6* 5.6*  ALBUMIN 1.9* 1.8*  AST 202* 208*  ALT 64* 70*  ALKPHOS 350* 342*  BILITOT 9.3* 9.5*   PT/INR Lab Results  Component Value Date   INR 1.6 (H) 11/10/2020   INR 1.4 (H) 11/05/2020   INR 1.4 (H) 11/04/2020   Hepatitis Panel No results for input(s): HEPBSAG, HCVAB, HEPAIGM, HEPBIGM in the last 72 hours. C-Diff No components found for: CDIFF Lipase     Component Value Date/Time   LIPASE 134 (H) 11/09/2020 1147    Drugs of Abuse  No results found for: LABOPIA, COCAINSCRNUR, LABBENZ, AMPHETMU, THCU, LABBARB   RADIOLOGY  STUDIES: CT Head Wo Contrast  Result Date: 11/09/2020 CLINICAL DATA:  Altered mental status.  History of cirrhosis with suspicious liver lesion and indeterminate bone lesions on a recent abdominal CT. EXAM: CT HEAD WITHOUT CONTRAST TECHNIQUE: Contiguous axial images were obtained from the base of the skull through the vertex without intravenous contrast. COMPARISON:  None. FINDINGS: Brain: There is no evidence of an acute infarct, intracranial hemorrhage, mass, midline shift, or extra-axial fluid collection. The ventricles and sulci are normal. Vascular: No hyperdense vessel. Skull: No fracture. At least 7 scattered small lucent skull lesions with the largest measuring 1.4 cm near the frontoparietal vertex on the right. Sinuses/Orbits: Paranasal sinuses and mastoid air cells are clear. Unremarkable orbits Other: None. IMPRESSION: 1. Unremarkable CT appearance of the brain. 2. Multiple small lucent skull lesions which are indeterminate but raise concern for metastatic disease. Electronically Signed   By: Logan Bores M.D.   On: 11/09/2020 13:11     IMPRESSION:   *   Progressive decompensated NASH cirrhosis patient with recent diagnosis of [place].. Progressive hyponatremia.  Progressive thrombocytopenia.  Hepatic encephalopathy. Minor ascites on imaging a few d ago.  MELD-Na 20.    *    Hepatic encephalopathy.  Lactulose po added.  Had decreased BM's at home.    *    hepatocellular carcinoma.  Has not had time to follow-up with IR.  CT head showing skull lesios possibly representing metastatic disease  *     Hyponatremia.  Acute on chronic. Urine Na <10.    *    Coagulopathy.  Prpgressove/    *      Hypoglycemia. Taking Glipizide in setting of poor po intake.  Treated w D 50 but persists on recheck at 0330 this AM, CBG 82 at 8 AM today.    *   Mild Azotemia.      PLAN:     *   Note plans for [facility] care and oncology consults.    *   IR consult now vs wait on the pall care/onc consults?  *    Up dosed lactulose to 30 g tid.     Azucena Freed  11/10/2020, 8:26 AM Phone [phone]  [phone]  GI ATTENDING  History, laboratories, x-rays, prior endoscopy reports reviewed.  Patient seen and examined.  Agree with comprehensive consultation note as outlined above.  Unfortunate woman with advanced cirrhosis complicated by hepatocellular carcinoma.  Now with altered mental status.  Suspect hepatic encephalopathy.  Other causes being evaluated.  Current meld score is 28.  Her prognosis is extremely poor.  Based on the location of [facility] and her overall health/body habitus, unlikely transplant candidate.  Agree with treating as of hepatic encephalopathy with lactulose to achieve 3-5 bowel movements per day.  Add Xifaxan 550 mg twice daily when able to take p.o.  Nothing further to add from GI perspective.  Agree with palliative care consultation.  We will sign off, but are available for questions.  Thanks  Docia Chuck. Geri Seminole., M.D. [facility] Division of Gastroenterology

## 2020-11-10 NOTE — Progress Notes (Signed)
Subjective:   Overnight, patient had fasting blood glucose of 36.  This morning, patient unable to provide details on reason for admission except that she experienced a fall at home. When attempting to discuss patient's recent laboratory and imaging results highly suspicious for hepatocellular carcinoma she responds that she does not have cancer. Discussed results thus far with patient's husband at bedside who desires to have oncology and gastroenterology involvement to help plan for where we can go from here (including consideration for further workup and treatment).  Objective:  Vital signs in last 24 hours: Vitals:   11/09/20 2021 11/09/20 2302 11/10/20 0825 11/10/20 1048  BP: (!) 102/51 110/63 (!) 89/51 117/75  Pulse: (!) 108 (!) 109 (!) 118 (!) 107  Resp: 20 19 15    Temp: 98 F (36.7 C) 97.7 F (36.5 C) 98 F (36.7 C)   TempSrc: Oral Oral Oral   SpO2: 97% 98% 100%   On room air  Intake/Output Summary (Last 24 hours) at 11/10/2020 1429 Last data filed at 11/09/2020 2302 Gross per 24 hour  Intake -  Output 150 ml  Net -150 ml  There were no vitals filed for this visit. Physical Exam Vitals and nursing note reviewed. Exam conducted with a chaperone present.  Constitutional:      Appearance: She is obese. She is ill-appearing.     Comments: Somnolent, ill-appearing woman lying on right side holding onto hospital bed frame. Eyes open throughout conversation with confusion and perseveration  HENT:     Mouth/Throat:     Mouth: Mucous membranes are dry.     Pharynx: Oropharynx is clear.  Eyes:     General: Scleral icterus present.     Extraocular Movements: Extraocular movements intact.  Cardiovascular:     Rate and Rhythm: Regular rhythm. Tachycardia present.     Pulses: Normal pulses.     Heart sounds: Normal heart sounds.  Pulmonary:     Effort: Pulmonary effort is normal. No respiratory distress.     Breath sounds: Normal breath sounds. No wheezing or rales.   Abdominal:     General: Abdomen is flat. There is distension.     Palpations: Abdomen is soft.     Tenderness: There is no abdominal tenderness.     Comments: 1+ pitting edema of abdominal wall  Musculoskeletal:     Right lower leg: Edema present.     Left lower leg: Edema present.     Comments: 2+ pitting edema of bilateral lower extremities extending to thighs  Skin:    General: Skin is warm and dry.     Coloration: Skin is jaundiced.  Neurological:     Comments: Oriented to person and place but not time or reason for hospitalization. No asterixis.  Psychiatric:        Attention and Perception: She is inattentive.        Speech: Speech is delayed.        Behavior: Behavior is slowed.        Cognition and Memory: She exhibits impaired recent memory.    Labs in last 24 hours: CBC Latest Ref Rng & Units 11/10/2020 11/09/2020 11/05/2020  WBC 4.0 - 10.5 K/uL 5.3 4.5 5.6  Hemoglobin 12.0 - 15.0 g/dL 10.7(L) 11.3(L) 10.9(L)  Hematocrit 36.0 - 46.0 % 28.7(L) 32.6(L) 30.7(L)  Platelets 150 - 400 K/uL 17(LL) 22(LL) 45(L)   CMP Latest Ref Rng & Units 11/10/2020 11/09/2020 11/05/2020  Glucose 70 - 99 mg/dL 43(LL) 48(L) 82  BUN 6 -  20 mg/dL 23(H) 26(H) 21(H)  Creatinine 0.44 - 1.00 mg/dL 0.88 0.76 1.01(H)  Sodium 135 - 145 mmol/L 123(L) 122(L) 125(L)  Potassium 3.5 - 5.1 mmol/L 4.3 4.4 4.0  Chloride 98 - 111 mmol/L 87(L) 88(L) 89(L)  CO2 22 - 32 mmol/L 26 26 26   Calcium 8.9 - 10.3 mg/dL 10.3 10.0 9.4  Total Protein 6.5 - 8.1 g/dL 5.6(L) 5.6(L) 5.5(L)  Total Bilirubin 0.3 - 1.2 mg/dL 9.5(H) 9.3(H) 8.4(H)  Alkaline Phos 38 - 126 U/L 342(H) 350(H) 406(H)  AST 15 - 41 U/L 208(H) 202(H) 157(H)  ALT 0 - 44 U/L 70(H) 64(H) 55(H)  Albumin - 1.8 INR - 1.6 APTT - 33 Glucose - 127, 102, 82, 89, 36, 77, 61, 80, 87, 107, 151, 164, 233  Assessment/Plan:  Active Problems:   Other cirrhosis of liver (HCC)   Type 2 diabetes mellitus with complication (HCC)   Decompensated hepatic cirrhosis  (HCC)   Hyponatremia   Hepatocellular carcinoma (HCC)   Hyperbilirubinemia   Hypoglycemia   Encephalopathy  Deborah Pham is a 55 year old woman with past medical history significant for cirrhosis 2/2 NASH, type 2 diabetes and a recent admission from 03/10-03/12 for decompensated cirrhosis in the setting of probable metastatic hepatocellular carcinoma who presented to [facility] on [facility]09/20 for altered mental status found to have hypoglycemia secondary to poor PO intake and glipizide use as well as progressive decompensation of her hepatic cirrhosis and further radiologic evidence concerning for metastatic hepatocellular carcinoma.  #Decompensated hepatic cirrhosis, active #Hepatic encephalopathy, active MELD 28 points and 19.6% 3 month mortality (Not on dialysis, creatinine 0.88, bilirubin 9.5, INR 1.6, sodium 123). Child-Pugh C. Patient's laboratory findings show progressive worsening of her hyponatremia, hyperbilirubinemia, thrombocytopenia, hyponatremia, and transaminitis. Patient has had her home diuretic regimen titrated up rapidly to furosemide 12m daily and spironolactone 2044mdaily during prior admission, however she continues to exhibit significant volume overload on examination with diffuse anasarca. We will consult gastroenterology for further recommendations regarding management of her progressive decompensated hepatic cirrhosis. 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 Patient had follow-up CT Abdomen Pelvis with contrast which, when compared with findings on MRI, was determined to be highly suspicious for  hepatocellular carcinoma with additional concern for bony metastases. Patient underwent CT Head on this admission with concern of bony metastases to the skull. Due to these findings, patient has very high suspicion for hepatocellular carcinoma. -Consulted medical oncology, appreciate recommendations -Consulted palliative medicine, appreciate recommendations -Treatment of decompensated hepatic cirrhosis as above  #Type 2 diabetes mellitus, chronic #Hypoglycemia, active Patient noted to be significantly hypoglycemic on arrival in the setting of continued glipizide administration at home despite poor PO intake. Patient's blood glucose readings mildly elevated in the setting of increased PO intake. SLP evaluation does not reveal further SLP needs at this time. -Carb modified diet -CBG monitoring  -Start SSI  Prior to Admission Living Arrangement: Home Anticipated Discharge Location: Home Barriers to Discharge: Continued medical workup Dispo: Anticipated discharge in approximately 3-4 day(s).   JohnCato Mulligan 11/10/2020, 2:29 PM Pager: [phone] 5pm on weekdays and 1pm on weekends: On Call pager [phone]

## 2020-11-10 NOTE — Progress Notes (Signed)
Initial Nutrition Assessment  DOCUMENTATION CODES:   Not applicable  INTERVENTION:   Recommend liberalizing pt to a regular diet  Ensure Enlive po TID, each supplement provides 350 kcal and 20 grams of protein  33m Prosource Plus po BID, each supplement provides 100 kcals and 15 grams of protein  MVI with minerals daily  NUTRITION DIAGNOSIS:   Inadequate oral intake related to poor appetite as evidenced by per patient/family report.    GOAL:   Patient will meet greater than or equal to 90% of their needs    MONITOR:   PO intake,Supplement acceptance,Labs,I & O's,Weight trends  REASON FOR ASSESSMENT:   Consult Poor PO,Assessment of nutrition requirement/status  ASSESSMENT:   Pt admitted with hypoglycemia, syncope, and multifactorial acute encephalopathy. PMH includes cirrhosis, type 2 DM, OA, hyponatremia, and recent admit for decompensated cirrhosis in the setting of possible metastatic hepatocellular carcinoma.  Pt denies N/V, but does report poor appetite/intake and endorses symptoms of dysphagia. Pt feels that her current admission is due to her poor po intake while continuing to take her DM medications as prescribed.   PMT has been consulted. SLP has also been consulted.   No PO intake documented. Will order oral nutrition supplements to provide pt with additional kcals/protein given reports of poor po intake.   No significant weight loss noted per weight readings; however, pt is noted to be fluid overloaded at this time, which may be masking potential weight loss.   Medications: lasix, SSI Q4H and at bedtime, chronulac, aldactone Labs: Na 123 (L) CBGs 82-102-127  NUTRITION - FOCUSED PHYSICAL EXAM:  Unable to perform at this time. Will attempt at follow-up.   Diet Order:   Diet Order            Diet Carb Modified Fluid consistency: Thin; Room service appropriate? Yes  Diet effective now                 EDUCATION NEEDS:   No education needs  have been identified at this time  Skin:  Skin Assessment: Reviewed RN Assessment  Last BM:  3/16  Height:   Ht Readings from Last 1 Encounters:  11/03/20 4' 11"  (1.499 m)    Weight:   Wt Readings from Last 1 Encounters:  11/03/20 103.4 kg   BMI:  There is no height or weight on file to calculate BMI.  Estimated Nutritional Needs:   Kcal:  1900-2100  Protein:  120-130 grams  Fluid:  >2L    ALarkin Ina MS, RD, LDN RD pager number and weekend/on-call pager number located in [place]

## 2020-11-10 NOTE — Evaluation (Signed)
Clinical/Bedside Swallow Evaluation Patient Details  Name: Shela Esses MRN: [id number] Date of Birth: [birth year]  Today's Date: 11/10/2020 Time: SLP Start Time (ACUTE ONLY): 97 SLP Stop Time (ACUTE ONLY): 0940 SLP Time Calculation (min) (ACUTE ONLY): 20 min  Past Medical History:  Past Medical History:  Diagnosis Date  . Diabetes mellitus without complication ([place])   . Fatty liver   . Gastric varices   . NASH (nonalcoholic steatohepatitis)   . Obesity    Past Surgical History:  Past Surgical History:  Procedure Laterality Date  . ESOPHAGOGASTRODUODENOSCOPY     HPI:  Adonis Ryther is a 55 y.o. female with a pertinent PMH of cirrhosis, type 2 diabetes, osteoarthritis, hyponatremia, and a recent admission to [facility] for decompensated cirrhosis in the setting of possible metastatic hepatocellular carcinoma who presents to [facility] after an episode of hypoglycemia. She did endorse recent anorexia  to MD and described symptoms of dysphagia made it difficult for her to maintain good p.o. intake.   Assessment / Plan / Recommendation Clinical Impression  Communicated with pts husband and daughter at bedside in [place], pt quite confused and unable to report symptoms. Family describes pt taking a few bites and then saying her food wont go down and stopping a meal or snack due to this sensation. Her intake has been very poor. Pt was able to tolerate sitting upright and with assist took cup and straw sips with adequate oral phase and appearance of normal swallow. No signs of aspiration. Pt accepted a few bites of puree and masticated a cookie but then didnt want any more. Likely dysphagia is related to a GI issue, requested MD consult GI. Reviewed basic esophageal strategies with family that may help her esophageal clearance if that is the problem. No further SLP needs at this time will sign off. SLP Visit Diagnosis: Dysphagia, unspecified (R13.10)    Aspiration Risk  Risk for  inadequate nutrition/hydration    Diet Recommendation Regular;Thin liquid   Medication Administration: Whole meds with liquid Supervision: Full supervision/cueing for compensatory strategies Compensations: Slow rate;Small sips/bites;Follow solids with liquid Postural Changes: Seated upright at 90 degrees    Other  Recommendations Recommended Consults: Consider GI evaluation Oral Care Recommendations: Oral care BID   Follow up Recommendations None      Frequency and Duration            Prognosis        Swallow Study   General HPI: [name] Stephaney Steven is a 55 y.o. female with a pertinent PMH of cirrhosis, type 2 diabetes, osteoarthritis, hyponatremia, and a recent admission to [facility] for decompensated cirrhosis in the setting of possible metastatic hepatocellular carcinoma who presents to [facility] after an episode of hypoglycemia. She did endorse recent anorexia  to MD and described symptoms of dysphagia made it difficult for her to maintain good p.o. intake. Type of Study: Bedside Swallow Evaluation Previous Swallow Assessment: none Diet Prior to this Study: Regular;Thin liquids Temperature Spikes Noted: No Respiratory Status: Room air History of Recent Intubation: No Behavior/Cognition: Lethargic/Drowsy;Distractible;Requires cueing Oral Cavity Assessment: Within Functional Limits Oral Care Completed by SLP: No Oral Cavity - Dentition: Adequate natural dentition Self-Feeding Abilities: Needs assist Patient Positioning: Upright in bed Baseline Vocal Quality: Normal Volitional Cough: Cognitively unable to elicit Volitional Swallow: Able to elicit    Oral/Motor/Sensory Function Overall Oral Motor/Sensory Function: Within functional limits   Ice Chips Ice chips: Not tested   Thin Liquid Thin Liquid: Within functional limits    Nectar  Thick Nectar Thick Liquid: Not tested   Honey Thick Honey Thick Liquid: Not tested   Puree Puree: Within functional limits   Solid      Solid: Within functional limits     Herbie [place], MA [place] Pager [phone] Office [phone]  Lynann Beaver 11/10/2020,10:43 AM

## 2020-11-11 ENCOUNTER — Inpatient Hospital Stay (HOSPITAL_COMMUNITY): Payer: No Typology Code available for payment source

## 2020-11-11 DIAGNOSIS — Z7189 Other specified counseling: Secondary | ICD-10-CM

## 2020-11-11 DIAGNOSIS — Z66 Do not resuscitate: Secondary | ICD-10-CM

## 2020-11-11 DIAGNOSIS — E162 Hypoglycemia, unspecified: Secondary | ICD-10-CM

## 2020-11-11 DIAGNOSIS — Z515 Encounter for palliative care: Secondary | ICD-10-CM

## 2020-11-11 LAB — CBC WITH DIFFERENTIAL/PLATELET
Abs Immature Granulocytes: 0.31 10*3/uL — ABNORMAL HIGH (ref 0.00–0.07)
Basophils Absolute: 0 10*3/uL (ref 0.0–0.1)
Basophils Relative: 1 %
Eosinophils Absolute: 0 10*3/uL (ref 0.0–0.5)
Eosinophils Relative: 0 %
HCT: 27.3 % — ABNORMAL LOW (ref 36.0–46.0)
Hemoglobin: 9.8 g/dL — ABNORMAL LOW (ref 12.0–15.0)
Immature Granulocytes: 8 %
Lymphocytes Relative: 21 %
Lymphs Abs: 0.8 10*3/uL (ref 0.7–4.0)
MCH: 33.3 pg (ref 26.0–34.0)
MCHC: 35.9 g/dL (ref 30.0–36.0)
MCV: 92.9 fL (ref 80.0–100.0)
Monocytes Absolute: 0.4 10*3/uL (ref 0.1–1.0)
Monocytes Relative: 11 %
Neutro Abs: 2.3 10*3/uL (ref 1.7–7.7)
Neutrophils Relative %: 59 %
Platelets: 10 10*3/uL — CL (ref 150–400)
RBC: 2.94 MIL/uL — ABNORMAL LOW (ref 3.87–5.11)
RDW: 14.4 % (ref 11.5–15.5)
WBC: 3.8 10*3/uL — ABNORMAL LOW (ref 4.0–10.5)
nRBC: 2.4 % — ABNORMAL HIGH (ref 0.0–0.2)

## 2020-11-11 LAB — GLUCOSE, CAPILLARY
Glucose-Capillary: 101 mg/dL — ABNORMAL HIGH (ref 70–99)
Glucose-Capillary: 121 mg/dL — ABNORMAL HIGH (ref 70–99)
Glucose-Capillary: 122 mg/dL — ABNORMAL HIGH (ref 70–99)
Glucose-Capillary: 142 mg/dL — ABNORMAL HIGH (ref 70–99)
Glucose-Capillary: 188 mg/dL — ABNORMAL HIGH (ref 70–99)
Glucose-Capillary: 97 mg/dL (ref 70–99)

## 2020-11-11 LAB — COMPREHENSIVE METABOLIC PANEL
ALT: 70 U/L — ABNORMAL HIGH (ref 0–44)
AST: 196 U/L — ABNORMAL HIGH (ref 15–41)
Albumin: 1.7 g/dL — ABNORMAL LOW (ref 3.5–5.0)
Alkaline Phosphatase: 313 U/L — ABNORMAL HIGH (ref 38–126)
Anion gap: 10 (ref 5–15)
BUN: 23 mg/dL — ABNORMAL HIGH (ref 6–20)
CO2: 26 mmol/L (ref 22–32)
Calcium: 10.5 mg/dL — ABNORMAL HIGH (ref 8.9–10.3)
Chloride: 86 mmol/L — ABNORMAL LOW (ref 98–111)
Creatinine, Ser: 0.85 mg/dL (ref 0.44–1.00)
GFR, Estimated: >60 mL/min (ref >60)
Glucose, Bld: 129 mg/dL — ABNORMAL HIGH (ref 70–99)
Potassium: 4.3 mmol/L (ref 3.5–5.1)
Sodium: 122 mmol/L — ABNORMAL LOW (ref 135–145)
Total Bilirubin: 9.6 mg/dL — ABNORMAL HIGH (ref 0.3–1.2)
Total Protein: 5.1 g/dL — ABNORMAL LOW (ref 6.5–8.1)

## 2020-11-11 MED ORDER — ZOLEDRONIC ACID 4 MG/100ML IV SOLN
4.0000 mg | Freq: Once | INTRAVENOUS | Status: DC
  Filled 2020-11-11: qty 100

## 2020-11-11 MED ORDER — TECHNETIUM TC 99M MEDRONATE IV KIT
20.0000 | PACK | Freq: Once | INTRAVENOUS | Status: AC | PRN
  Administered 2020-11-11: 20 via INTRAVENOUS

## 2020-11-11 MED ORDER — ZOLEDRONIC ACID 4 MG/5ML IV CONC
4.0000 mg | Freq: Once | INTRAVENOUS | Status: AC
  Administered 2020-11-11: 4 mg via INTRAVENOUS
  Filled 2020-11-11: qty 5

## 2020-11-11 NOTE — Progress Notes (Signed)
Occupational Therapy Evaluation Patient Details Name: Deborah Pham MRN: [id number] DOB: 25-May-1966 Today's Date: 11/11/2020    History of Present Illness 55 y.o. female with a pertinent PMH of cirrhosis, type 2 diabetes, osteoarthritis, hyponatremia, and a recent admission to [facility] for decompensated cirrhosis in the setting of possible metastatic hepatocellular carcinoma who presented 11/09/20 to [facility] after an episode of hypoglycemia. Acute encephalopathy   Clinical Impression   PTA pt independent with ADL and mobility. Recent decline in mobility with fall at home. Pt confused at times during assessment, requiring +2 Mod A with mobility and Max A with ADL tasks. Son present during session and asking about his Mom's medical situation, apparently unaware of her diagnosis- MD messaged and states he would meet with son. If pt goes home with hospice, recommend equipment listed below. Will follow acutely as appropriate to help with family education regarding assisting with ADL and mobility.     Follow Up Recommendations  Home health OT;Supervision/Assistance - 24 hour    Equipment Recommendations  3 in 1 bedside commode;Tub/shower seat;Wheelchair (measurements OT);Wheelchair cushion (measurements OT);Hospital bed    Recommendations for Other Services Other (comment) (Palliative Consult)     Precautions / Restrictions Precautions Precautions: Fall      Mobility Bed Mobility Overal bed mobility: Needs Assistance Bed Mobility: Supine to Sit;Sit to Supine Rolling: Supervision (with rails) Sidelying to sit: Mod assist;HOB elevated Supine to sit: Mod assist;HOB elevated Sit to supine: Mod assist Sit to sidelying: Mod assist;+2 for physical assistance     Transfers Overall transfer level: Needs assistance Equipment used: 2 person hand held assist Transfers: Sit to/from Stand Sit to Stand: Min assist;+2 physical assistance         General transfer comment: min A to power  up; Knees buckling with asterixis type movement; unable to problem solve to side step up to [facility] (son speaking in [place])    Balance Overall balance assessment: Needs assistance Sitting-balance support: Feet supported Sitting balance-Leahy Scale: Poor Sitting balance - Comments: slight lean to left     Standing balance-Leahy Scale: Poor                             ADL either performed or assessed with clinical judgement   ADL Overall ADL's : Needs assistance/impaired Eating/Feeding: Set up;Supervision/ safety   Grooming: Minimal assistance;Sitting   Upper Body Bathing: Minimal assistance;Sitting   Lower Body Bathing: Moderate assistance;Sit to/from stand   Upper Body Dressing : Minimal assistance   Lower Body Dressing: Maximal assistance;Sit to/from stand   Toilet Transfer: Moderate assistance;+2 for physical assistance   Toileting- Clothing Manipulation and Hygiene: Maximal assistance Toileting - Clothing Manipulation Details (indicate cue type and reason): increased incontinenece     Functional mobility during ADLs: Moderate assistance;+2 for physical assistance       Vision         Perception     Praxis      Pertinent Vitals/Pain Pain Assessment: No/denies pain Faces Pain Scale: No hurt     Hand Dominance Right   Extremity/Trunk Assessment Upper Extremity Assessment Upper Extremity Assessment: Generalized weakness   Lower Extremity Assessment Lower Extremity Assessment: Defer to PT evaluation   Cervical / Trunk Assessment Cervical / Trunk Assessment: Other exceptions (increased body habitus)   Communication Communication Communication: Prefers language other than English   Cognition Arousal/Alertness: Lethargic Behavior During Therapy: Restless;Flat affect Overall Cognitive Status: Impaired/Different from baseline Area of Impairment: Orientation;Attention;Memory;Following commands;Safety/judgement;Awareness;Problem  solving                  Orientation Level: Disoriented to;Time;Situation Current Attention Level: Sustained Memory: Decreased recall of precautions;Decreased short-term memory Following Commands: Follows one step commands inconsistently Safety/Judgement: Decreased awareness of safety;Decreased awareness of deficits Awareness: Intellectual Problem Solving: Slow processing;Difficulty sequencing;Requires verbal cues;Requires tactile cues General Comments: perseverating at times on ansers   General Comments       Exercises Exercises: Other exercises Other Exercises Other Exercises: encouraged son to complete bed level BU/LE AROM with his Mom. Son states he was concerned about haing his Mom move because of the [place]  - educated son that it is OK to move when [place] is in place   [place] expects to be discharged to:: Private residence Living Arrangements: Spouse/significant other Available Help at Discharge: Family;Available PRN/intermittently Type of Home: House Home Access: Stairs to enter [employer] of Steps: 2   Home Layout: One level     Bathroom Shower/Tub: Occupational psychologist: Standard Bathroom Accessibility: No   Home Equipment: None          Prior Functioning/Environment Level of Independence: Independent                 OT Problem List: Decreased strength;Decreased activity tolerance;Impaired balance (sitting and/or standing);Decreased coordination;Decreased cognition;Decreased safety awareness;Decreased knowledge of use of DME or AE;Obesity;Cardiopulmonary status limiting activity;Increased edema      OT Treatment/Interventions: Self-care/ADL training;Therapeutic exercise;Neuromuscular education;Energy conservation;DME and/or AE instruction;Therapeutic activities;Cognitive remediation/compensation;Patient/family education;Balance training    OT Goals(Current goals can be found in the care plan  section) Acute Rehab OT Goals Patient Stated Goal: Per son for his Mom to get stronger and return home OT Goal Formulation: With patient/family Time For Goal Achievement: 11/25/20 Potential to Achieve Goals: Fair  OT Frequency: Min 2X/week   Barriers to D/C:            Co-evaluation              AM-PAC OT "6 Clicks" Daily Activity     Outcome Measure Help from another person eating meals?: A Little Help from another person taking care of personal grooming?: A Little Help from another person toileting, which includes using toliet, bedpan, or urinal?: A Lot Help from another person bathing (including washing, rinsing, drying)?: A Lot Help from another person to put on and taking off regular upper body clothing?: A Little Help from another person to put on and taking off regular lower body clothing?: A Lot 6 Click Score: 15   End of Session Nurse Communication: Mobility status;Other (comment) (MD messaged to discuss medical situation with son per his request)  Activity Tolerance: Patient tolerated treatment well Patient left: in bed;with call bell/phone within reach;with bed alarm set;with family/visitor present  OT Visit Diagnosis: Unsteadiness on feet (R26.81);Other abnormalities of gait and mobility (R26.89);Muscle weakness (generalized) (M62.81);History of falling (Z91.81);Other symptoms and signs involving cognitive function                Time: 1020-1037 OT Time Calculation (min): 17 min Charges:  OT General Charges $OT Visit: 1 Visit OT Evaluation $OT Eval Moderate Complexity: [place], OT/L   Acute OT Clinical Specialist Acute Rehabilitation Services Pager [phone] Office [phone]   [facility] 11/11/2020, 12:17 PM

## 2020-11-11 NOTE — Consult Note (Signed)
Consultation Note Date: 11/11/2020   Patient Name: Deborah Pham  DOB: October 24, 1965  MRN: [id number]  Age / Sex: 55 y.o., female   PCP: Deborah Mercury, MD Referring Physician: Angelica Pou, MD   REASON FOR CONSULTATION:Establishing goals of care  Palliative Care consult requested for goals of care discussion in this 55 y.o. female with multiple medical problems including diabetes mellitus, NASH cirrhosis, obesity, and gastric varices. Patient presented to ED from home with complaints of syncope. On EMS arrival blood glucose 30s and was given amp of D50. Patient recently hospitalized and treated for decompensated cirrhosis with findings questionable for hepatocellular carcinoma. Work-up is significant for hyponatremia, elevated lipase, ammonia, UA positive for ketones and bilirubin.   Clinical Assessment and Goals of Care: I have reviewed medical records including lab results, imaging, Epic notes, and MAR, received report from the bedside RN, and assessed the patient.   I met at the bedside with patient's husband to discuss diagnosis prognosis, GOC, EOL wishes, disposition and options. Husband has requested to speak with his step-son Deborah Pham. He does confirm that Deborah Pham has updated him on previous discussions with myself and medical team. Patient is Spanish speaking and husband is Spanish speaking but able to hold some conversations in [place].   I introduced Palliative Medicine as specialized medical care for people living with serious illness. It focuses on providing relief from the symptoms and stress of a serious illness. The goal is to improve quality of life for both the patient and the family.  We discussed a brief life review of the patient, along with her functional and nutritional status. Son states patient has been married to his stepfather for over 10 years. She has 2 sons from previous marriage. Patient is originally from [place]. She enjoys spending time with  family.   Patient was working in a [employer] in early January. She had to stop working after becoming ill and having constant pain. Son reports over the past 5-6 weeks she has complained of increased fatigue, abdominal distention, shortness of breath, and weakness to the point she did not feel like walking. Prior to her decline she was able to perform all ADLs independently.   We discussed Her current illness and what it means in the larger context of Her on-going co-morbidities. Natural disease trajectory and expectations at EOL were discussed.  A detailed discussion was had today regarding advanced directives.  Concepts specific to code status, artifical feeding and hydration, continued IV antibiotics and rehospitalization. The difference between a aggressive medical intervention and a palliative comfort care path were discussed at length. Values and goals of care important to patient and family were attempted to be elicited.   Son confirms similar discussions with attending and he has provided updates to his brother and stepfather. Deborah Pham is tearful expressing he and his brother wants his mother to be at peace and not suffering. They do not like seeing her in this state and knows this is not what she wants. He states Mr. Deborah Pham wants patient home but also states she will not go until Deborah Pham calls her home and is relying on a small piece of hope.   I created space and opportunity to discuss patient's full code status with consideration to current illness and expressed wishes. Deborah Pham shares they have discussed and all are in mutual agreement family would not want heroic measures and for patient to pass away naturally. Education provided on DNR/DNI. He verbalizes understanding and confirms family's  wishes for DNAR/DNI.   Deborah Pham confirms family understands patient is not strong enough or in any condition to receive aggressive treatments such as chemotherapy and doesn't feel she will reach this  point. Family has discussed bringing patient home allowing her to spend what time she has left with family. They are leaning towards this option but would like additional time for further discussions and request for follow-up with both sons and husband on tomorrow.   Hospice services outpatient were explained.. Son verbalized their understanding and awareness ofhospice's goals and philosophy of care. We will plan to discuss further tomorrow.   Questions and concerns were addressed.  The family was encouraged to call with questions or concerns.  PMT will continue to support holistically as needed.   CODE STATUS: DNR  ADVANCE DIRECTIVES: Primary Decision Maker: Husband, Deborah Pham (he expresses he would like his stepson, Deborah Pham to be main contact)   SYMPTOM MANAGEMENT: per attending   Palliative Prophylaxis:   Aspiration, Bowel Regimen, Delirium Protocol, Eye Care, Frequent Pain Assessment, Oral Care and Turn Reposition  PSYCHO-SOCIAL/SPIRITUAL:  Support System: Family  Desire for further Chaplaincy support:No   Additional Recommendations (Limitations, Scope, Preferences):  Treat the treatable, ongoing discussions, family leaning towards comfort focus care  Education on hospice/palliative    PAST MEDICAL HISTORY: Past Medical History:  Diagnosis Date  . Diabetes mellitus without complication (Deborah Pham)   . Fatty liver   . Gastric varices   . NASH (nonalcoholic steatohepatitis)   . Obesity     ALLERGIES:  is allergic to lisinopril.   MEDICATIONS:  Current Facility-Administered Medications  Medication Dose Route Frequency Provider Last Rate Last Admin  . (feeding supplement) PROSource Plus liquid 30 mL  30 mL Oral BID BM Deborah Pou, MD   30 mL at 11/11/20 1257  . acetaminophen (TYLENOL) tablet 650 mg  650 mg Oral Q6H PRN Marianna Payment, MD       Or  . acetaminophen (TYLENOL) suppository 650 mg  650 mg Rectal Q6H PRN Marianna Payment, MD      . feeding supplement  (ENSURE ENLIVE / ENSURE PLUS) liquid 237 mL  237 mL Oral TID BM Deborah Pou, MD   237 mL at 11/11/20 0824  . furosemide (LASIX) tablet 80 mg  80 mg Oral Daily Cato Mulligan, MD   80 mg at 11/11/20 0824  . insulin aspart (novoLOG) injection 0-9 Units  0-9 Units Subcutaneous Q4H Cato Mulligan, MD   1 Units at 11/11/20 1257  . lactulose (CHRONULAC) 10 GM/15ML solution 30 g  30 g Oral TID Vena Rua, PA-C   30 g at 11/11/20 5631  . multivitamin with minerals tablet 1 tablet  1 tablet Oral Daily Deborah Pou, MD   1 tablet at 11/11/20 [phone]  . polyethylene glycol (MIRALAX / GLYCOLAX) packet 17 g  17 g Oral Daily PRN Marianna Payment, MD      . rifaximin Doreene Nest) tablet 550 mg  550 mg Oral BID Cato Mulligan, MD   550 mg at 11/11/20 0824  . spironolactone (ALDACTONE) tablet 200 mg  200 mg Oral Daily Cato Mulligan, MD   200 mg at 11/11/20 2637    VITAL SIGNS: BP 110/89 (BP Location: Right Wrist)   Pulse (!) 112   Temp 98.3 F (36.8 C) (Oral)   Resp 15   SpO2 95%  There were no vitals filed for this visit.  Estimated body mass index is 46.05 kg/m as calculated from the following:   Height  as of 11/03/20: _0  (1.499 m).   Weight as of 11/03/20: 103.4 kg.  LABS: CBC:    Component Value Date/Time   WBC 3.8 (L) 11/11/2020 0246   HGB 9.8 (L) 11/11/2020 0246   HGB 14.9 02/21/2017 1603   HCT 27.3 (L) 11/11/2020 0246   HCT 40.4 02/21/2017 1603   PLT 10 (LL) 11/11/2020 0246   PLT 96 (LL) 02/21/2017 1603   Comprehensive Metabolic Panel:    Component Value Date/Time   NA 122 (L) 11/11/2020 0246   NA 133 (L) 06/24/2020 1500   K 4.3 11/11/2020 0246   BUN 23 (H) 11/11/2020 0246   BUN 12 06/24/2020 1500   CREATININE 0.85 11/11/2020 0246   ALBUMIN 1.7 (L) 11/11/2020 0246   ALBUMIN 3.6 02/21/2017 1603     Review of Systems  Unable to perform ROS: Mental status change  All other systems reviewed and are negative.   Physical Exam General: NAD, somnolent,  frail -ill appearing Cardiovascular: regular rate and rhythm Pulmonary:  diminished bilaterally  Abdomen: soft, nontender, + bowel sounds Extremities: bilateral lower extremity edema Neurological: somnolent, will not answer questions or follow commands   Prognosis: Days-Weeks  Discharge Planning:  To Be Determined  Recommendations: . DNR/DNI-as requested and confirmed by son, Deborah Pham on behalf of family . Continue with current plan of care, family requesting ongoing discussions. Realistic in expectations and verbalizes understanding of poor prognosis. Requesting follow-up discussion tomorrow with planned meeting at [place]. Family is leaning towards comfort focused care with hospice support in the home.  Marland Kitchen PMT will continue to support and follow as needed. Please call team line with urgent needs.   Palliative Performance Scale: PPS 10-20%               Son, Deborah Pham expressed understanding and was in agreement with this plan.   Thank you for allowing the Palliative Medicine Team to assist in the care of this patient. Please utilize secure chat with additional questions, if there is no response within 30 minutes please call the above phone number.   Time In: 1605 Time Out: 1710 Time Total: 65 min.   Visit consisted of counseling and education dealing with the complex and emotionally intense issues of symptom management and palliative care in the setting of serious and potentially life-threatening illness.Greater than 50%  of this time was spent counseling and coordinating care related to the above assessment and plan.  Signed by:  Alda Lea, AGPCNP-BC Palliative Medicine Team  Phone: [phone] Pager: [phone] Amion: [place] Team providers are available by phone from 7am to 7pm daily and can be reached through the team cell phone.  Should this patient require assistance outside of these hours, please call the patient's attending physician.

## 2020-11-11 NOTE — Progress Notes (Addendum)
Physical Therapy Treatment Patient Details Name: Deborah Pham MRN: [id number] DOB: Dec 15, 1965 Today's Date: 11/11/2020    History of Present Illness 55 y.o. female with a pertinent PMH of cirrhosis, type 2 diabetes, osteoarthritis, hyponatremia, and a recent admission to [facility] for decompensated cirrhosis in the setting of possible metastatic hepatocellular carcinoma who presented 11/09/20 to [facility] after an episode of hypoglycemia. Acute encephalopathy    PT Comments    Pt admitted with above diagnosis. Pt needed assist for ambulation of 3 people - 2 for support and 1 person for close chair follow. Pt with bil knee buckling. Education with son regarding caring for pt and that most likely she would initially be at wheelchair level in the home.  Son very supportive and did perform some stand pivot transfers with pt at end of treatment to practice. If pt goes home with Hospice, recommend equipment as below.  Pt currently with functional limitations due to balance and endurance deficits. Pt will benefit from skilled PT to increase their independence and safety with mobility to allow discharge to the venue listed below.     Follow Up Recommendations  Home health PT;Supervision/Assistance - 24 hour     Equipment Recommendations  3in1 (PT);Wheelchair [phone] wheelchair with anti tippers, desk armrests and foot rests);Wheelchair cushion (20x18 pressure relieving cushion);Hospital bed;Other (comment) (May need to rent hoyer lift short term) , issued gait belt today   Recommendations for Other Services OT consult     Precautions / Restrictions Precautions Precautions: Fall Restrictions Weight Bearing Restrictions: No    Mobility  Bed Mobility Overal bed mobility: Needs Assistance Bed Mobility: Supine to Sit;Sit to Supine Rolling: Min guard;Min assist;+2 for physical assistance (with rails) Sidelying to sit: Mod assist;HOB elevated;+2 for physical assistance Supine to sit: Mod  assist;HOB elevated Sit to supine: Mod assist Sit to sidelying: Mod assist;+2 for physical assistance General bed mobility comments: pt following gestures more than verbal instructions    Transfers Overall transfer level: Needs assistance Equipment used: Rolling walker (2 wheeled) Transfers: Sit to/from [facility] Sit to Stand: Min assist;+2 physical assistance;Mod assist         General transfer comment: min A to power up; Knees buckling with asterixis type movement.  Was able to stand and pivot to the recliner with min to mod assist due to knee buckling and incr need for cues and trunk asssist.Son practiced stand pivot transfers with belt bed to chair.   Ambulation/Gait Ambulation/Gait assistance: Mod assist (+3 - 2 persons physical assist and 3rd person for chair follow) Gait Distance (Feet): 40 Feet (20 feet x 2) Assistive device: Rolling walker (2 wheeled) Gait Pattern/deviations: Step-to pattern;Decreased step length - right;Decreased step length - left;Decreased stride length;Steppage;Shuffle;Drifts right/left;Trunk flexed   Gait velocity interpretation: <1.31 ft/sec, indicative of household ambulator General Gait Details: Pt was able to ambulate to door and rest in chair.  Pt needs mod to max assist at times due to constant knee buckling.  Pt rested in chair and then was able to ambulate further but fatigues quickly. As she fatigues needed incr assist for posture as well as to move the RW.  Pt not using her hands as she could to support herself.   Stairs             Wheelchair Mobility    Modified Rankin (Stroke Patients Only)       Balance Overall balance assessment: Needs assistance Sitting-balance support: Feet supported Sitting balance-Leahy Scale: Poor Sitting balance - Comments: slight  lean to left   Standing balance support: Bilateral upper extremity supported;During functional activity Standing balance-Leahy Scale: Poor Standing  balance comment: relies on UE and extternal support of 2 people                            Cognition Arousal/Alertness: Lethargic Behavior During Therapy: Restless;Flat affect Overall Cognitive Status: Impaired/Different from baseline Area of Impairment: Orientation;Attention;Memory;Following commands;Safety/judgement;Awareness;Problem solving                 Orientation Level: Disoriented to;Time;Situation Current Attention Level: Sustained Memory: Decreased recall of precautions;Decreased short-term memory Following Commands: Follows one step commands inconsistently Safety/Judgement: Decreased awareness of safety;Decreased awareness of deficits Awareness: Intellectual Problem Solving: Slow processing;Difficulty sequencing;Requires verbal cues;Requires tactile cues General Comments: perseverating at times on ansers      Exercises General Exercises - Lower Extremity Ankle Circles/Pumps: AROM;Both;10 reps;Supine Long Arc Quad: AROM;Both;10 reps;Seated Hip Flexion/Marching: AROM;Both;10 reps;Seated Other Exercises Other Exercises: encouraged son to complete bed level BU/LE AROM with his Mom. Son states he was concerned about haing his Mom move because of the [place]  - educated son that it is OK to move when [place] is in place    General Comments General comments (skin integrity, edema, etc.): Graciella available to translate. Son also present.      Pertinent Vitals/Pain Pain Assessment: No/denies pain Faces Pain Scale: No hurt    Home Living Family/patient expects to be discharged to:: Private residence Living Arrangements: Spouse/significant other Available Help at Discharge: Family;Available PRN/intermittently Type of Home: House Home Access: Stairs to enter   Home Layout: One level Home Equipment: None      Prior Function Level of Independence: Independent          PT Goals (current goals can now be found in the care plan section) Acute Rehab  PT Goals Patient Stated Goal: Per son for his Mom to get stronger and return home PT Goal Formulation: With family Time For Goal Achievement: 11/24/20 Potential to Achieve Goals: Poor Progress towards PT goals: Progressing toward goals    Frequency    Min 3X/week      PT Plan Current plan remains appropriate    Co-evaluation              AM-PAC PT "6 Clicks" Mobility   Outcome Measure  Help needed turning from your back to your side while in a flat bed without using bedrails?: A Little Help needed moving from lying on your back to sitting on the side of a flat bed without using bedrails?: A Lot Help needed moving to and from a bed to a chair (including a wheelchair)?: A Lot Help needed standing up from a chair using your arms (e.g., wheelchair or bedside chair)?: A Lot Help needed to walk in hospital room?: A Lot Help needed climbing 3-5 steps with a railing? : Total 6 Click Score: 12    End of Session Equipment Utilized During Treatment: Gait belt Activity Tolerance: Patient limited by lethargy;Patient limited by fatigue Patient left: with call bell/phone within reach;with family/visitor present;in chair;with chair alarm set Nurse Communication: Mobility status;Need for lift equipment PT Visit Diagnosis: Muscle weakness (generalized) (M62.81);Difficulty in walking, not elsewhere classified (R26.2);Other symptoms and signs involving the nervous system (V40.981)     Time: 1914-7829 PT Time Calculation (min) (ACUTE ONLY): 43 min  Charges:  $Gait Training: 23-37 mins $Therapeutic Exercise: 8-22 mins  Zaharah Amir M,PT Acute Rehab Services [phone] (pager)   Alvira Philips 11/11/2020, 1:38 PM

## 2020-11-11 NOTE — Progress Notes (Signed)
Subjective:   This morning, patient reports that she feels very well and denies any symptoms or concerns. IMTS discussed with patient and patient's son, Rica Mote, her current findings of hepatocellular carcinoma with metastasis to bone and possibly lung in the setting of known advanced hepatic cirrhosis. Discussed with family that patient has very critical prognosis with a limited life expectancy. Provided recommendation to focus on comfort measures with which patient's son agrees and to which patient responds, "thank you." Rica Mote requested the opportunity to talk with his step father, Faustino, about our discussion. We will continue with current medical management as planned for now without further escalation at this time. Pending conversation with patient's spouse, patient and patient's family may request to transition to comfort care in the coming hours.   Objective:  Vital signs in last 24 hours: Vitals:   11/10/20 2009 11/10/20 2026 11/11/20 0031 11/11/20 0405  BP: (!) 97/55 123/68 124/82 122/62  Pulse:    95  Resp: (!) 25 (!) 22 18 19   Temp: 98.1 F (36.7 C)  98.6 F (37 C) 98.4 F (36.9 C)  TempSrc: Oral  Oral Oral  SpO2:  92% 94% 100%  On room air  Intake/Output Summary (Last 24 hours) at 11/11/2020 1207 Last data filed at 11/10/2020 1320 Gross per 24 hour  Intake 120 ml  Output -  Net 120 ml   Physical Exam Constitutional:      General: She is not in acute distress.    Appearance: She is obese. She is ill-appearing.  HENT:     Mouth/Throat:     Mouth: Mucous membranes are dry.     Pharynx: Oropharynx is clear.  Eyes:     General: Scleral icterus present.     Extraocular Movements: Extraocular movements intact.  Pulmonary:     Effort: Pulmonary effort is normal. No respiratory distress.  Abdominal:     General: There is distension.     Palpations: Abdomen is soft.  Skin:    General: Skin is dry.     Coloration: Skin is jaundiced.  Neurological:     General: No  focal deficit present.     Mental Status: She is disoriented.    Labs in last 24 hours: CBC Latest Ref Rng & Units 11/11/2020 11/10/2020 11/09/2020  WBC 4.0 - 10.5 K/uL 3.8(L) 5.3 4.5  Hemoglobin 12.0 - 15.0 g/dL 9.8(L) 10.7(L) 11.3(L)  Hematocrit 36.0 - 46.0 % 27.3(L) 28.7(L) 32.6(L)  Platelets 150 - 400 K/uL 10(LL) 17(LL) 22(LL)   CMP Latest Ref Rng & Units 11/11/2020 11/10/2020 11/09/2020  Glucose 70 - 99 mg/dL 129(H) 43(LL) 48(L)  BUN 6 - 20 mg/dL 23(H) 23(H) 26(H)  Creatinine 0.44 - 1.00 mg/dL 0.85 0.88 0.76  Sodium 135 - 145 mmol/L 122(L) 123(L) 122(L)  Potassium 3.5 - 5.1 mmol/L 4.3 4.3 4.4  Chloride 98 - 111 mmol/L 86(L) 87(L) 88(L)  CO2 22 - 32 mmol/L 26 26 26   Calcium 8.9 - 10.3 mg/dL 10.5(H) 10.3 10.0  Total Protein 6.5 - 8.1 g/dL 5.1(L) 5.6(L) 5.6(L)  Total Bilirubin 0.3 - 1.2 mg/dL 9.6(H) 9.5(H) 9.3(H)  Alkaline Phos 38 - 126 U/L 313(H) 342(H) 350(H)  AST 15 - 41 U/L 196(H) 208(H) 202(H)  ALT 0 - 44 U/L 70(H) 70(H) 64(H)   Glucose - 122, 142, 168, 144, 127, 102 Pathologist smear - in process Ionized calcium - in process Fibrinogen - 222  Imaging in last 24 hours: CT CHEST W CONTRAST  Result Date: 11/10/2020 IMPRESSION: 1. Mild right  axillary, right paratracheal and left supraclavicular lymphadenopathy, nonspecific, cannot exclude nodal metastatic disease. The left supraclavicular adenopathy may be amenable to percutaneous biopsy if clinically warranted. 2. Tiny right upper lobe 2 mm solid pulmonary nodule, for which follow-up chest CT is recommended in 3 months. 3. Trace dependent bilateral pleural effusions, right greater than left. 4. Chronic mild circumferential wall thickening in the lower thoracic esophagus, nonspecific, potentially due to reflux esophagitis, Barrett's esophagus and/or neoplasm. 5. Cirrhosis. Known poorly defined heterogeneous hypodense central liver mass anterior to the IVC, better visualized on prior MRI abdomen study as detailed on 11/04/2020 CT  abdomen study, suspicious for hepatocellular carcinoma. 6. Small volume ascites. 7. 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 Partially visualized splenomegaly.  Assessment/Plan:  Active Problems:   Other cirrhosis of liver (HCC)   Type 2 diabetes mellitus with complication ([place])   Decompensated hepatic cirrhosis ([place])   Hyponatremia   Hepatocellular carcinoma ([place])   Hyperbilirubinemia   Hypoglycemia   Encephalopathy   Liver cirrhosis (HCC)  Cecilia Aleea Hendry is a 55 year old woman with past medical history significant for cirrhosis 2/2 NASH, type 2 diabetes and a recent admission from 03/10-03/12 for decompensated cirrhosis in the setting of metastatic hepatocellular carcinoma who presented to [facility] on 11/09/20 for altered mental status found to have hypoglycemia and hepatic encephalopathy as well as progressive decompensation of her hepatic cirrhosis and further radiologic evidence supporting her metastatic hepatocellular carcinoma.  #Metastatic hepatocellular carcinoma, active Patient has advanced hepatic cirrhosis with metastatic hepatocellular carcinoma per imaging. Due to patient's rapid functional decline and severe liver disease she is not a surgical candidate. Our team has recommended to patient and her son, Rica Mote, to not pursue further aggressive evaluation and treatment due to the severity of her condition. Rica Mote wishes to talk through these new findings with his step-father/patient's spouse, Faustino. -Medical oncology following, appreciate recommendations -Follow-up results of bone scan -Palliative medicine team consulted, appreciate recommendations -Possible transition to DNR/comfort care today  #Decompensated hepatic cirrhosis, active #Hepatic encephalopathy, active MELD 28 points and 19.6% 3 month mortality (Not on dialysis, creatinine 0.88, bilirubin 9.5, INR 1.6, sodium 123). Child-Pugh C. Patient's laboratory work continues to show further progression of  her hepatic cirrhosis. Patient's mentation is improved today, however she is not at her baseline.  -Continue furosemide 65m daily -Continue spironolactone 2070mdaily  -Continue lactulose to 30g three times daily, titrate to 3-5 bowel movements -Rifaximin 55077mwice daily  #Type 2 diabetes mellitus, chronic Patient's blood glucose readings maintained between 100-200. -Regular diet -CBG monitoring -Sensitive SSI Q4H   #PT recs: Supervision/assistance - 24 hour; home health  3in1; Wheelchair; Wheelchair cushion; Hospital bed; [employer]; Medical transport home  #OT recs: Pending evaluation  Prior to Admission Living Arrangement: [facility]: Continued medical workup and goals of care conversations Dispo: Anticipated discharge in approximately 2-3 day(s).   JohCato MulliganD 11/11/2020, 12:07 PM Pager: [phone] 5pm on weekdays and 1pm on weekends: On Call pager [phone]

## 2020-11-11 NOTE — Progress Notes (Addendum)
HEMATOLOGY-ONCOLOGY PROGRESS NOTE  SUBJECTIVE: Deborah Pham is about the same today.  Her son is at the bedside.  He reports that she does wake up a little bit and talk to him.  He tells me that she knows where she is but does not know the year or month.  No bleeding reported.  PHYSICAL EXAMINATION:  Vitals:   11/11/20 0031 11/11/20 0405  BP: 124/82 122/62  Pulse:  95  Resp: 18 19  Temp: 98.6 F (37 C) 98.4 F (36.9 C)  SpO2: 94% 100%   There were no vitals filed for this visit.  Intake/Output from previous day: 03/17 0701 - 03/18 0700 In: 240 [P.O.:240] Out: -   GENERAL: Opens eyes intermittently, somnolent, does not answer questions EYES: Scleral icterus present LUNGS: clear to auscultation and percussion with normal breathing effort HEART: regular rate & rhythm and no murmurs, 1+ bilateral lower extremity edema ABDOMEN: Obese, positive bowel sounds NEURO: Somnolent, opens eyes  LABORATORY DATA:  I have reviewed the data as listed CMP Latest Ref Rng & Units 11/11/2020 11/10/2020 11/09/2020  Glucose 70 - 99 mg/dL 129(H) 43(LL) 48(L)  BUN 6 - 20 mg/dL 23(H) 23(H) 26(H)  Creatinine 0.44 - 1.00 mg/dL 0.85 0.88 0.76  Sodium 135 - 145 mmol/L 122(L) 123(L) 122(L)  Potassium 3.5 - 5.1 mmol/L 4.3 4.3 4.4  Chloride 98 - 111 mmol/L 86(L) 87(L) 88(L)  CO2 22 - 32 mmol/L 26 26 26   Calcium 8.9 - 10.3 mg/dL 10.5(H) 10.3 10.0  Total Protein 6.5 - 8.1 g/dL 5.1(L) 5.6(L) 5.6(L)  Total Bilirubin 0.3 - 1.2 mg/dL 9.6(H) 9.5(H) 9.3(H)  Alkaline Phos 38 - 126 U/L 313(H) 342(H) 350(H)  AST 15 - 41 U/L 196(H) 208(H) 202(H)  ALT 0 - 44 U/L 70(H) 70(H) 64(H)    Lab Results  Component Value Date   WBC 3.8 (L) 11/11/2020   HGB 9.8 (L) 11/11/2020   HCT 27.3 (L) 11/11/2020   MCV 92.9 11/11/2020   PLT 10 (LL) 11/11/2020   NEUTROABS 2.3 11/11/2020    CT Head Wo Contrast  Result Date: 11/09/2020 CLINICAL DATA:  Altered mental status. History of cirrhosis with suspicious liver lesion and  indeterminate bone lesions on a recent abdominal CT. EXAM: CT HEAD WITHOUT CONTRAST TECHNIQUE: Contiguous axial images were obtained from the base of the skull through the vertex without intravenous contrast. COMPARISON:  None. FINDINGS: Brain: There is no evidence of an acute infarct, intracranial hemorrhage, mass, midline shift, or extra-axial fluid collection. The ventricles and sulci are normal. Vascular: No hyperdense vessel. Skull: No fracture. At least 7 scattered small lucent skull lesions with the largest measuring 1.4 cm near the frontoparietal vertex on the right. Sinuses/Orbits: Paranasal sinuses and mastoid air cells are clear. Unremarkable orbits Other: None. IMPRESSION: 1. Unremarkable CT appearance of the brain. 2. Multiple small lucent skull lesions which are indeterminate but raise concern for metastatic disease. Electronically Signed   By: Logan Bores M.D.   On: 11/09/2020 13:11   CT CHEST W CONTRAST  Result Date: 11/10/2020 CLINICAL DATA:  Cirrhosis with liver mass concerning for hepatocellular carcinoma. Chest staging. EXAM: CT CHEST WITH CONTRAST TECHNIQUE: Multidetector CT imaging of the chest was performed during intravenous contrast administration. CONTRAST:  14m OMNIPAQUE IOHEXOL 300 MG/ML  SOLN COMPARISON:  11/04/2020 CT abdomen/pelvis and 11/03/2020 MRI abdomen. FINDINGS: Cardiovascular: Normal heart size. No significant pericardial effusion/thickening. Great vessels are normal in course and caliber. No central pulmonary emboli. Mediastinum/Nodes: No discrete thyroid nodules. Small amount of layering  oral contrast in the lower thoracic esophagus with chronic mild circumferential wall thickening in the lower thoracic esophagus. Mildly enlarged 1.1 cm short axis diameter right axillary node (series 3/image 35). Mild left supraclavicular adenopathy up to 1.1 cm (series 3/image 10). Mildly enlarged 1.0 cm right paratracheal node (series 3/image 38). No hilar adenopathy. Lungs/Pleura: No  pneumothorax. Trace dependent bilateral pleural effusions, right greater than left. No acute consolidative airspace disease or lung masses. Tiny right upper lobe 2 mm solid pulmonary nodule (series 4/image 57). Tiny calcified 3 mm right middle lobe granuloma. No additional significant pulmonary nodules. Upper abdomen: Diffusely irregular liver surface compatible with cirrhosis. Poorly defined heterogeneous hypodense central liver mass anterior to the IVC (series 3/image 90), better visualized on prior MRI abdomen as detailed on 11/04/2020 CT abdomen study. 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 Small volume ascites. Partially visualized splenomegaly. Musculoskeletal: No aggressive appearing focal osseous lesions. Marked thoracic spondylosis. IMPRESSION: 1. Mild right axillary, right paratracheal and left supraclavicular lymphadenopathy, nonspecific, cannot exclude nodal metastatic disease. The left supraclavicular adenopathy may be amenable to percutaneous biopsy if clinically warranted. 2. Tiny right upper lobe 2 mm solid pulmonary nodule, for which follow-up chest CT is recommended in 3 months. 3. Trace dependent bilateral pleural effusions, right greater than left. 4. Chronic mild circumferential wall thickening in the lower thoracic esophagus, nonspecific, potentially due to reflux esophagitis, Barrett's esophagus and/or neoplasm. 5. Cirrhosis. Known poorly defined heterogeneous hypodense central liver mass anterior to the IVC, better visualized on prior MRI abdomen study as detailed on 11/04/2020 CT abdomen study, suspicious for hepatocellular carcinoma. 6. Small volume ascites. 7. 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 Partially visualized splenomegaly. Electronically Signed   By: Ilona Sorrel M.D.   On: 11/10/2020 20:24   MR ABDOMEN W WO CONTRAST  Result Date: 11/03/2020 CLINICAL DATA:  Cirrhosis, worsening abdominal swelling, elevated AFP, rule out hepatoma EXAM: MRI ABDOMEN  WITHOUT AND WITH CONTRAST TECHNIQUE: Multiplanar multisequence MR imaging of the abdomen was performed both before and after the administration of intravenous contrast. CONTRAST:  49m GADAVIST GADOBUTROL 1 MMOL/ML IV SOLN COMPARISON:  CT abdomen pelvis, 08/05/2020 FINDINGS: Examination of the abdomen is generally somewhat limited by breath motion artifact throughout. Lower chest: Trace bilateral pleural effusions. Hepatobiliary: Coarse, nodular, cirrhotic morphology of the liver. No focal liver lesion or suspicious arterial contrast enhancement; assessment limited by breath motion artifact. No gallstones, gallbladder wall thickening, or biliary dilatation. Pancreas: Unchanged 9 mm cystic lesion of the anterior pancreatic neck (series 8, image 22). No pancreatic ductal dilatation or surrounding inflammatory changes. Spleen: Splenomegaly, maximum coronal span 16.9 cm. Adrenals/Urinary Tract: Adrenal glands are unremarkable. Kidneys are normal, without renal calculi, solid lesion, or hydronephrosis. Stomach/Bowel: Stomach is within normal limits. No evidence of bowel wall thickening, distention, or inflammatory changes. Vascular/Lymphatic: Gastroesophageal and splenorenal varices. Evaluation for patency of the portal vein is substantially limited by breath motion artifact. Unchanged prominent celiac axis, gastrohepatic ligament, and retroperitoneal lymph nodes. Other: No abdominal wall hernia or abnormality. Small volume ascites throughout the abdomen. Anasarca. Musculoskeletal: No acute or significant osseous findings. IMPRESSION: 1. Examination is generally limited by breath motion artifact, in particular assessment of the portal vein and assessment for focal liver lesions are limited. 2. Cirrhotic morphology of the liver. No focal liver lesion or suspicious arterial contrast enhancement identified within the limitations of the exam. 3. Stigmata of portal hypertension including splenomegaly, varices, and small  volume ascites. 4. Unchanged 9 mm cystic lesion of the anterior pancreatic  neck, nonspecific and may reflect a small IPMN or sequelae of prior pancreatitis. Recommend follow-up pancreatic protocol MRI at 1 year to establish initial 5 years of stability if not otherwise imaged. This recommendation follows ACR white paper recommendations for management of incidental pancreatic cystic lesions. 5. Anasarca and trace bilateral pleural effusions. Electronically Signed   By: Eddie Candle M.D.   On: 11/03/2020 16:13   CT ABDOMEN PELVIS W CONTRAST  Result Date: 11/04/2020 CLINICAL DATA:  Cirrhosis and elevated AFP. Evaluate for possible HCC. EXAM: CT ABDOMEN AND PELVIS WITH CONTRAST TECHNIQUE: Multidetector CT imaging of the abdomen and pelvis was performed using the standard protocol following bolus administration of intravenous contrast. CONTRAST:  16m OMNIPAQUE IOHEXOL 300 MG/ML  SOLN COMPARISON:  MRI 11/03/2020 FINDINGS: Lower chest: The heart is within normal limits in size. No pericardial effusion. Small right pleural effusion with minimal overlying atelectasis. Hepatobiliary: Advanced cirrhotic changes involving the liver with portal venous hypertension, portal venous collaterals, splenomegaly, ascites and esophageal varices. Unfortunately, no early arterial phase sequence was performed. However, when looking at the prior MRI examination I think there is an area of heterogeneous early arterial phase enhancement in the central aspect of segment 5 near the caudate lobe. This is best seen on the subtraction images at 25 seconds (image 23, 24 in 25 of series 20). This measures approximately 4.7 x 4.3 cm and is also diffusion positive and highly suspicious for HCC. Gallbladder wall thickening likely due to the patient's cirrhosis and ascites. Normal caliber and course of the common bile duct. Pancreas: No mass, inflammation or ductal dilatation. Spleen: Stable splenomegaly. No splenic lesions. Large splenorenal shunt  noted. Adrenals/Urinary Tract: The adrenal glands and kidneys are unremarkable. No worrisome renal lesions or hydronephrosis. Moderate renal cortical scarring changes involving the left kidney. The bladder is unremarkable. Stomach/Bowel: The stomach, duodenum, small bowel and colon are grossly normal. No acute inflammatory changes, mass lesions or obstructive findings. The terminal ileum is normal. The appendix is normal. Scattered colonic diverticulosis without findings for acute diverticulitis. Vascular/Lymphatic: The aorta and branch vessels are patent. No atherosclerotic calcifications. The major venous structures are patent. Moderate narrowing of the main portal vein but no thrombus or occlusion. Reproductive: The uterus and ovaries are unremarkable. Other: Moderate volume abdominal/pelvic ascites. There is also mesenteric and omental edema and diffuse body wall edema. Musculoskeletal: Scattered indeterminate lucent bone lesions. Could not exclude metastatic disease. IMPRESSION: 1. Based on the prior MRI I think findings are highly suspicious for hepatocellular carcinoma involving the central aspect of segment 5 near the caudate lobe and IVC. Please see above discussion. PET-CT may be helpful for further evaluation and also to evaluate the bone lesions. 2. Advanced cirrhotic changes involving the liver with portal venous hypertension, portal venous collaterals, esophageal varices, splenomegaly and ascites. Electronically Signed   By: PMarijo SanesM.D.   On: 11/04/2020 20:08   ECHOCARDIOGRAM COMPLETE  Result Date: 11/04/2020    ECHOCARDIOGRAM REPORT   Patient Name:   Deborah Pham Doswell Date of Exam: 11/04/2020 Medical Rec #:  [id number]              Height:       59.0 in Accession #:    [id number]             Weight:       228.0 lb Date of Birth:  [birth year]              BSA:  1.950 m Patient Age:    55 years                BP:           93/57 mmHg Patient Gender: F                       HR:            89 bpm. Exam Location:  Inpatient Procedure: 2D Echo, Cardiac Doppler and Color Doppler Indications:    Anasarca  History:        Patient has no prior history of Echocardiogram examinations.  Sonographer:    Merrie Roof RDCS Referring Phys: [id number] [place]  1. Left ventricular ejection fraction, by estimation, is 60 to 65%. The left ventricle has normal function. The left ventricle has no regional wall motion abnormalities. There is mild left ventricular hypertrophy. Left ventricular diastolic parameters are consistent with Grade I diastolic dysfunction (impaired relaxation).  2. Right ventricular systolic function is normal. The right ventricular size is normal.  3. Left atrial size was mildly dilated.  4. Right atrial size was mildly dilated.  5. The mitral valve is normal in structure. Trivial mitral valve regurgitation. No evidence of mitral stenosis.  6. The aortic valve is tricuspid. Aortic valve regurgitation is not visualized. No aortic stenosis is present.  7. Increased flow velocities may be secondary to anemia, thyrotoxicosis, hyperdynamic or high flow state. FINDINGS  Left Ventricle: Left ventricular ejection fraction, by estimation, is 60 to 65%. The left ventricle has normal function. The left ventricle has no regional wall motion abnormalities. The left ventricular internal cavity size was normal in size. There is  mild left ventricular hypertrophy. Left ventricular diastolic parameters are consistent with Grade I diastolic dysfunction (impaired relaxation). Right Ventricle: The right ventricular size is normal. No increase in right ventricular wall thickness. Right ventricular systolic function is normal. Left Atrium: Left atrial size was mildly dilated. Right Atrium: Right atrial size was mildly dilated. Pericardium: There is no evidence of pericardial effusion. Mitral Valve: The mitral valve is normal in structure. Trivial mitral valve regurgitation. No evidence of  mitral valve stenosis. Tricuspid Valve: The tricuspid valve is normal in structure. Tricuspid valve regurgitation is not demonstrated. No evidence of tricuspid stenosis. Aortic Valve: The aortic valve is tricuspid. Aortic valve regurgitation is not visualized. No aortic stenosis is present. Aortic valve mean gradient measures 9.0 mmHg. Aortic valve peak gradient measures 18.0 mmHg. Aortic valve area, by VTI measures 2.47  cm. Pulmonic Valve: The pulmonic valve was normal in structure. Pulmonic valve regurgitation is not visualized. No evidence of pulmonic stenosis. Aorta: The aortic root is normal in size and structure. Venous: The inferior vena cava was not well visualized. IAS/Shunts: The interatrial septum was not well visualized.  LEFT VENTRICLE PLAX 2D LVIDd:         3.50 cm  Diastology LVIDs:         2.40 cm  LV e' medial:    7.07 cm/s LV PW:         1.00 cm  LV E/e' medial:  10.9 LV IVS:        1.00 cm  LV e' lateral:   10.10 cm/s LVOT diam:     1.90 cm  LV E/e' lateral: 7.6 LV SV:         98 LV SV Index:   50 LVOT Area:     2.84 cm  RIGHT  VENTRICLE RV Basal diam:  3.70 cm LEFT ATRIUM             Index       RIGHT ATRIUM           Index LA diam:        4.00 cm 2.05 cm/m  RA Area:     18.20 cm LA Vol (A2C):   64.4 ml 33.03 ml/m RA Volume:   55.50 ml  28.46 ml/m LA Vol (A4C):   71.1 ml 36.46 ml/m LA Biplane Vol: 68.7 ml 35.23 ml/m  AORTIC VALVE AV Area (Vmax):    2.38 cm AV Area (Vmean):   2.38 cm AV Area (VTI):     2.47 cm AV Vmax:           212.00 cm/s AV Vmean:          136.000 cm/s AV VTI:            0.398 m AV Peak Grad:      18.0 mmHg AV Mean Grad:      9.0 mmHg LVOT Vmax:         178.00 cm/s LVOT Vmean:        114.000 cm/s LVOT VTI:          0.347 m LVOT/AV VTI ratio: 0.87  AORTA Ao Root diam: 2.70 cm Ao Asc diam:  3.00 cm MITRAL VALVE MV Area (PHT): 3.08 cm     SHUNTS MV Decel Time: 246 msec     Systemic VTI:  0.35 m MV E velocity: 77.10 cm/s   Systemic Diam: 1.90 cm MV A velocity: 101.00  cm/s MV E/A ratio:  0.76 Cherlynn Kaiser MD Electronically signed by Cherlynn Kaiser MD Signature Date/Time: 11/04/2020/2:31:56 PM    Final    IR ABDOMEN US LIMITED  Result Date: 11/04/2020 CLINICAL DATA:  History of cirrhosis now with concern for intra-abdominal ascites. Please perform ascites search ultrasound and ultrasound-guided paracentesis as indicated. EXAM: LIMITED ABDOMEN ULTRASOUND FOR ASCITES TECHNIQUE: Limited ultrasound survey for ascites was performed in all four abdominal quadrants. COMPARISON:  Abdominal MRI-11/03/2020; CT abdomen and pelvis-08/05/2020 FINDINGS: Sonographic evaluation of the abdomen demonstrates a trace amount of intra-abdominal ascites, too small to allow for safe ultrasound-guided paracentesis given patient's body habitus. No paracentesis attempted. IMPRESSION: Trace amount of intra-abdominal ascites, too small to allow for safe ultrasound-guided paracentesis given patient's body habitus. No paracentesis attempted. Electronically Signed   By: Sandi Mariscal M.D.   On: 11/04/2020 10:50    ASSESSMENT AND PLAN: 1.  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 -11/04/2020 CT abdomen/pelvis with contrast-based on prior MRI, findings are highly suspicious for hepatocellular carcinoma involving the central aspect of segment 5 near the caudate lobe and IVC (area measures 4.7 x 4.3 cm).  Additionally there are scattered indeterminate lucent bone lesions and cannot exclude metastatic disease. -11/04/2020 AFP 2899.0 -11/09/2020 CT head without contrast-unremarkable CT appearance of the brain, multiple small lucent skull lesions which are indeterminate but raise concern for metastatic  disease. -11/10/2020 CT chest with contrast-mild right axillary, right paratracheal, and left supraclavicular lymphadenopathy which is nonspecific but could represent metastatic disease. 2.  Decompensated NASH cirrhosis 3.  Hepatic encephalopathy 4.  Coagulopathy 5.  Thrombocytopenia 6.  Normocytic anemia  7.  Hyponatremia 8.  Hyperbilirubinemia and transaminitis secondary to cirrhosis 9.  Dysphagia 10. Hypercalcemia  Deborah Pham appears unchanged.  A CT of the chest was performed last evening which shows some mild right axillary, right paratracheal, and left supraclavicular lymphadenopathy which is nonspecific but could be related to metastatic disease.  The left supraclavicular adenopathy may be amenable to percutaneous biopsy.  A bone scan was performed earlier today but not yet read.  I reviewed the imaging findings, AFP results, and discussion from our initial consult with the patient's son who is at the bedside today.  We discussed that findings are concerning for metastatic HCC.  We discussed aggressive interventions versus a more comfort based approach.  If the patient's family would like to pursue aggressive interventions, we can consider biopsy of her supraclavicular lymph node or a bone lesion to confirm the diagnosis.  However, I discussed with the son that she is a poor candidate for systemic treatment at this time due to her cirrhosis and poor performance status.  The patient's son has indicated that the family plans to talk about this over the weekend but he overall feels that they are considering a more comfort based approach.  The patient was noted to have hypercalcemia and a dose of Zometa 4 mg IV was given today.  The patient has thrombocytopenia but is not actively bleeding.  The patient may need a platelet transfusion if the family wishes to continue aggressive measures if platelet count drops below 10,000 or she has active bleeding.  Recommendations: 1.  We will follow up on  bone scan results are available. 2.  Zometa 4 mg IV for hypercalcemia. 3.  Transfuse platelets for platelet count less than 10,000 or active bleeding. 4.  We will consider for biopsy next week if the family wishes to pursue aggressive measures.  However, if the family wishes to pursue hospice/comfort measures this would also be appropriate given that she is a poor candidate for systemic treatment.   LOS: 1 day   Mikey Bussing, DNP, AGPCNP-BC, AOCNP 11/11/20 Deborah Pham was seen early this morning.  She appeared unchanged compared to when I saw her yesterday afternoon.  She remained confused.  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  The severe thrombocytopenia is most likely related to advanced cirrhosis.  She could have underlying DIC related to liver failure and malignancy.  The altered mental status is secondary to hepatic encephalopathy and potentially hypercalcemia.  We will treat the hypercalcemia today.  She will not be a candidate for systemic treatment of the hepatocellular carcinoma unless her performance status improves.  I agree with palliative care consultation.  Dr. Jana Hakim will see her on 11/12/2020.  I was present for greater than 50% of today's visit.  I performed medical decision making.

## 2020-11-12 DIAGNOSIS — K7469 Other cirrhosis of liver: Principal | ICD-10-CM

## 2020-11-12 LAB — COMPREHENSIVE METABOLIC PANEL
ALT: 76 U/L — ABNORMAL HIGH (ref 0–44)
AST: 190 U/L — ABNORMAL HIGH (ref 15–41)
Albumin: 1.7 g/dL — ABNORMAL LOW (ref 3.5–5.0)
Alkaline Phosphatase: 275 U/L — ABNORMAL HIGH (ref 38–126)
Anion gap: 10 (ref 5–15)
BUN: 25 mg/dL — ABNORMAL HIGH (ref 6–20)
CO2: 26 mmol/L (ref 22–32)
Calcium: 10.8 mg/dL — ABNORMAL HIGH (ref 8.9–10.3)
Chloride: 86 mmol/L — ABNORMAL LOW (ref 98–111)
Creatinine, Ser: 0.99 mg/dL (ref 0.44–1.00)
GFR, Estimated: >60 mL/min (ref >60)
Glucose, Bld: 200 mg/dL — ABNORMAL HIGH (ref 70–99)
Potassium: 4.3 mmol/L (ref 3.5–5.1)
Sodium: 122 mmol/L — ABNORMAL LOW (ref 135–145)
Total Bilirubin: 11.4 mg/dL — ABNORMAL HIGH (ref 0.3–1.2)
Total Protein: 4.9 g/dL — ABNORMAL LOW (ref 6.5–8.1)

## 2020-11-12 LAB — CBC
HCT: 27 % — ABNORMAL LOW (ref 36.0–46.0)
Hemoglobin: 9.7 g/dL — ABNORMAL LOW (ref 12.0–15.0)
MCH: 33.6 pg (ref 26.0–34.0)
MCHC: 35.9 g/dL (ref 30.0–36.0)
MCV: 93.4 fL (ref 80.0–100.0)
Platelets: 12 10*3/uL — CL (ref 150–400)
RBC: 2.89 MIL/uL — ABNORMAL LOW (ref 3.87–5.11)
RDW: 14.8 % (ref 11.5–15.5)
WBC: 4.1 10*3/uL (ref 4.0–10.5)
nRBC: 3.4 % — ABNORMAL HIGH (ref 0.0–0.2)

## 2020-11-12 LAB — PROTIME-INR
INR: 1.8 — ABNORMAL HIGH (ref 0.8–1.2)
Prothrombin Time: 19.8 seconds — ABNORMAL HIGH (ref 11.4–15.2)

## 2020-11-12 LAB — GLUCOSE, CAPILLARY
Glucose-Capillary: 156 mg/dL — ABNORMAL HIGH (ref 70–99)
Glucose-Capillary: 191 mg/dL — ABNORMAL HIGH (ref 70–99)
Glucose-Capillary: 218 mg/dL — ABNORMAL HIGH (ref 70–99)
Glucose-Capillary: 224 mg/dL — ABNORMAL HIGH (ref 70–99)

## 2020-11-12 MED ORDER — INSULIN ASPART 100 UNIT/ML ~~LOC~~ SOLN: 0.0000 [IU] | Freq: Every day | SUBCUTANEOUS | Status: DC

## 2020-11-12 MED ORDER — LACTULOSE 10 GM/15ML PO SOLN: 20.0000 g | Freq: Three times a day (TID) | ORAL | Status: DC

## 2020-11-12 MED ORDER — LACTULOSE 10 GM/15ML PO SOLN
10.0000 g | Freq: Three times a day (TID) | ORAL | Status: DC
  Administered 2020-11-12 – 2020-11-13 (×3): 10 g via ORAL
  Filled 2020-11-12 (×3): qty 15

## 2020-11-12 MED ORDER — ENSURE ENLIVE PO LIQD: 237.0000 mL | Freq: Three times a day (TID) | ORAL | Status: DC | PRN

## 2020-11-12 MED ORDER — INSULIN ASPART 100 UNIT/ML ~~LOC~~ SOLN: 0.0000 [IU] | Freq: Three times a day (TID) | SUBCUTANEOUS | Status: DC

## 2020-11-12 NOTE — Progress Notes (Signed)
   Daily Progress Note   Patient Name: Deborah Pham       Date: 11/12/2020 DOB: Nov 06, 1965  Age: 55 y.o. MRN#: [id number] Attending Physician: Angelica Pou, MD Primary Care Physician: [name], MD Admit Date: 11/09/2020  Reason for Consultation/Follow-up: Establishing goals of care  Subjective: Chart Reviewed. Updates Received. Interpretor utilized [employer] ID # [id number]).   Patient somnolent, easily awaken. Family is at the bedside (husband and daughter-in-law). No acute distress. Denies pain. She is oriented to family, person, and place.   Dr. Jana Hakim at the bedside and provided extensive updates and discussions regarding available options. Additional support provided. Family somewhat tearful. Emotional support provided.   Husband confirms wishes for patient to discharge home with hospice support. Will need DME and home transportation. Education provided at length regarding outpatient hospice support goals and philosophy of care. Family verbalized understanding.   Detailed education provided regarding symptom management at discharge. They are aware hospice RN will provide further education once arranged. We discussed medications for pain, dyspnea, and anxiety. Family verbalized understanding and appreciation.   Expressed goal is to continue with current treatment while hospitalized, minimize medications, and began discharge planning. DNAR/DNI confirmed.    All questions answered and support provided.   Length of Stay: 2 days  Vital Signs: BP (!) 105/57 (BP Location: Right Arm)   Pulse 100   Temp 98.7 F (37.1 C) (Oral)   Resp 18   SpO2 98%  SpO2: SpO2: 98 % O2 Device: O2 Device: Room Air O2 Flow Rate:    Physical Exam: NAD, ill-appearing RRR Diminished bases Abdomen soft, +BS, distended Awake, alert and oriented to self, family, and place              Palliative Care Assessment & Plan  HPI: Palliative Care consult requested for goals of care  discussion in this 55 y.o. female with multiple medical problems including diabetes mellitus, NASH cirrhosis, obesity, and gastric varices. Patient presented to ED from home with complaints of syncope. On EMS arrival blood glucose 30s and was given amp of D50. Patient recently hospitalized and treated for decompensated cirrhosis with findings questionable for hepatocellular carcinoma. Work-up is significant for hyponatremia, elevated lipase, ammonia, UA positive for ketones and bilirubin.   Code Status:  DNR  Goals of Care/Recommendations:  DNAR/DNI  Continue current plan of care, minimize medication/interventions  Family is clear in expressed goals for comfort once patient returns home with family. Has requested outpatient hospice support. Will require PTAR transfer home and DME. (TOC referral placed)  Dr. Jana Hakim provided extensive update and support.   At discharge patient will need medications for comfort as listed (unless Oncology decides differently)  Roxanol PRN   Ativan PRN  Robinul PRN   PMT will continue to support and follow as needed.   Prognosis: < 3 months  Discharge Planning: Home with Hospice  Thank you for allowing the Palliative Medicine Team to assist in the care of this patient.  Time Total: 40 min.  Visit consisted of counseling and education dealing with the complex and emotionally intense issues of symptom management and palliative care in the setting of serious and potentially life-threatening illness.Greater than 50%  of this time was spent counseling and coordinating care related to the above assessment and plan.  Alda Lea, AGPCNP-BC  Palliative Medicine Team [phone]

## 2020-11-12 NOTE — Progress Notes (Signed)
Subjective:   Overnight, no acute events.  This morning, patient has husband and daughter at bedside. Patient reports that she feels very well and denies any pain or nausea. Patient asks that we remove her blood pressure cuff. She states that she would like to go home tomorrow. She reports that she does not have any symptoms that we need to treat for now. Discussed current medications that patient is receiving thoroughly with family and addressed all questions and concerns.   Objective:  Vital signs in last 24 hours: Vitals:   11/12/20 0043 11/12/20 0440 11/12/20 0800 11/12/20 1338  BP: 108/72 (!) 94/53 (!) 105/57 (!) 97/52  Pulse: (!) 106 100  (!) 101  Resp: 16 18  20   Temp: 98.1 F (36.7 C) 98 F (36.7 C) 98.7 F (37.1 C)   TempSrc: Oral Oral Oral   SpO2: 93% 98%  94%  On room air  Intake/Output Summary (Last 24 hours) at 11/12/2020 1421 Last data filed at 11/12/2020 0400 Gross per 24 hour  Intake 600 ml  Output 300 ml  Net 300 ml   Physical Exam Vitals reviewed.  Constitutional:      General: She is not in acute distress.    Appearance: She is obese. She is ill-appearing.  HENT:     Mouth/Throat:     Mouth: Mucous membranes are dry.     Pharynx: Oropharynx is clear.  Eyes:     General: Scleral icterus present.  Abdominal:     General: There is distension.     Comments: Trace pitting edema of abdominal wall  Musculoskeletal:     Right lower leg: Edema present.     Left lower leg: Edema present.     Comments: 1+ pitting edema of bilateral lower extremities  Skin:    General: Skin is warm.     Coloration: Skin is jaundiced.  Neurological:     General: No focal deficit present.     Mental Status: She is alert. Mental status is at baseline.  Psychiatric:        Mood and Affect: Mood normal.        Behavior: Behavior normal.        Thought Content: Thought content normal.        Judgment: Judgment normal.    Labs in last 24 hours: CBC Latest Ref Rng & Units  11/12/2020 11/11/2020 11/10/2020  WBC 4.0 - 10.5 K/uL 4.1 3.8(L) 5.3  Hemoglobin 12.0 - 15.0 g/dL 9.7(L) 9.8(L) 10.7(L)  Hematocrit 36.0 - 46.0 % 27.0(L) 27.3(L) 28.7(L)  Platelets 150 - 400 K/uL 12(LL) 10(LL) 17(LL)   CMP Latest Ref Rng & Units 11/12/2020 11/11/2020 11/10/2020  Glucose 70 - 99 mg/dL 200(H) 129(H) 43(LL)  BUN 6 - 20 mg/dL 25(H) 23(H) 23(H)  Creatinine 0.44 - 1.00 mg/dL 0.99 0.85 0.88  Sodium 135 - 145 mmol/L 122(L) 122(L) 123(L)  Potassium 3.5 - 5.1 mmol/L 4.3 4.3 4.3  Chloride 98 - 111 mmol/L 86(L) 86(L) 87(L)  CO2 22 - 32 mmol/L 26 26 26   Calcium 8.9 - 10.3 mg/dL 10.8(H) 10.5(H) 10.3  Total Protein 6.5 - 8.1 g/dL 4.9(L) 5.1(L) 5.6(L)  Total Bilirubin 0.3 - 1.2 mg/dL 11.4(H) 9.6(H) 9.5(H)  Alkaline Phos 38 - 126 U/L 275(H) 313(H) 342(H)  AST 15 - 41 U/L 190(H) 196(H) 208(H)  ALT 0 - 44 U/L 76(H) 70(H) 70(H)  INR - 1.8 Glucose - 191, 224, 188, 101, 121, 97 Ionized calcium - in process  Imaging in last 24  hours: NM Bone Scan Whole Body Result Date: 11/11/2020 IMPRESSION: 1. Findings most consistent with osseous metastatic disease in the skull, sternum, right ribs, both femoral shafts as well as right femoral neck. Right femoral neck could place patient at risk of pathologic fracture. 2. Lower thoracic and upper lumbar spine uptake, also suspicious for metastasis. 3. Uptake within both proximal humeri and at the shoulder joints may be degenerative or related to metastasis. Increased uptake in the right proximal radius is also indeterminate.  Assessment/Plan:  Active Problems:   Other cirrhosis of liver (H[facility])   Type 2 diabetes mellitus with complication ([place])   Decompensated hepatic cirrhosis ([place])   Hyponatremia   Hepatocellular carcinoma ([place])   Hyperbilirubinemia   Hypoglycemia   Encephalopathy   Liver cirrhosis (HCC)  Deborah Pham is a 55 year old woman with past medical history significant for cirrhosis 2/2 NASH, type 2 diabetes and a recent admission  from 03/10-03/12 for decompensated cirrhosis in the setting of metastatic hepatocellular carcinoma who presented to [facility] on 11/09/20 for altered mental status found to have hypoglycemia and hepatic encephalopathy as well as progressive decompensation of her hepatic cirrhosis and further radiologic evidence supporting her metastatic hepatocellular carcinoma.  #Hepatocellular carcinoma with osseous metastatic disease, active Patient has confirmed osseus metastatic disease on bone scan yesterday. Following discussion with palliative medicine and oncology, patient requests home hospice. She is hopeful to be able to discharge home tomorrow. We will work to have Ms. [place] home with her family as soon as possible. -Medical oncology following, appreciate recommendations -Palliative medicine team following, appreciate recommendations -TOC following, appreciate recommendations -DNR/DNI -Home hospice -Discontinue cardiac monitoring -Transfer from progressive to med-surg  #Decompensated hepatic cirrhosis, active #Hepatic encephalopathy, active Patient has been having very frequent bowel movements / diarrhea with current dosing of lactulose. Her mentation appears to have improved from admission. -Decrease lactulose to 10g three times daily, titrate to 3-5 bowel movements -Continue furosemide 28m daily -Continue spironolactone 2067mdaily  -Rifaximin [id number] daily  #Type 2 diabetes mellitus, chronic Patient's blood glucose readings elevated to 188-224 in past 12 hours. Will modify insulin regimen to decrease frequency but increase intensity. -Regular diet -Modify SSI: Moderate SSI AC TID  #PT recs: Supervision/assistance - 24 hour; home health   3in1; Wheelchair; Wheelchair cushion; Hospital bed; [employer]; Medical transport home #OT recs: Pending evaluation #Diet: Regular #Code status: DNR/DNI  Prior to Admission Living Arrangement: Home Anticipated Discharge Location: Home hospice versus  [facility] Barriers to Discharge: Continued medical workup and goals of care conversations Dispo: Anticipated discharge in approximately 2-3 day(s).   JohCato MulliganD 11/12/2020, 2:21 PM Pager: [phone] 5pm on weekdays and 1pm on weekends: On Call pager [phone]

## 2020-11-12 NOTE — Plan of Care (Signed)
Patient having diarrhea d/t lactulose, heart rate remains mildly elevated, pt requires spanish interpeter   Problem: Education: Goal: Knowledge of General Education information will improve Description: Including pain rating scale, medication(s)/side effects and non-pharmacologic comfort measures Outcome: Progressing   Problem: Health Behavior/Discharge Planning: Goal: Ability to manage health-related needs will improve Outcome: Not Progressing   Problem: Clinical Measurements: Goal: Ability to maintain clinical measurements within normal limits will improve Outcome: Not Progressing Goal: Will remain free from infection Outcome: Progressing Goal: Diagnostic test results will improve Outcome: Not Progressing Goal: Respiratory complications will improve Outcome: Progressing Goal: Cardiovascular complication will be avoided Outcome: Progressing   Problem: Activity: Goal: Risk for activity intolerance will decrease Outcome: Not Progressing   Problem: Nutrition: Goal: Adequate nutrition will be maintained Outcome: Progressing   Problem: Coping: Goal: Level of anxiety will decrease Outcome: Progressing   Problem: Elimination: Goal: Will not experience complications related to bowel motility Outcome: Progressing Goal: Will not experience complications related to urinary retention Outcome: Progressing   Problem: Pain Managment: Goal: General experience of comfort will improve Outcome: Progressing   Problem: Safety: Goal: Ability to remain free from injury will improve Outcome: Progressing   Problem: Skin Integrity: Goal: Risk for impaired skin integrity will decrease Outcome: Progressing   Problem: Safety: Goal: Ability to remain free from injury will improve Outcome: Progressing

## 2020-11-12 NOTE — Progress Notes (Signed)
Tayen Narang   DOB:21-Feb-1966   EU#:[id number]   XVQ#:[id number]  Subjective:  Deborah Adalina Dopson is somnolent in bed, but awakens easily. She is oriented to place and person but thinks it's 2021. She tells me she has no apetite and food does not taste right. Pain in abdomen is better. Husband and daughter are at bedside   Objective: Spanish speaker examined in bed Vitals:   11/12/20 0440 11/12/20 0800  BP: (!) 94/53 (!) 105/57  Pulse: 100   Resp: 18   Temp: 98 F (36.7 C) 98.7 F (37.1 C)  SpO2: 98%     There is no height or weight on file to calculate BMI.  Intake/Output Summary (Last 24 hours) at 11/12/2020 1031 Last data filed at 11/12/2020 0400 Gross per 24 hour  Intake 600 ml  Output 300 ml  Net 300 ml    .  Lungs no rales or wheezes--auscultated anterolaterally  Heart regular rate and rhythm  Abdomen soft, +BS, fluid wave+  Neuro nonfocal    CBG (last 3)  Recent Labs    11/12/20 0039 11/12/20 0457 11/12/20 0745  GLUCAP 224* 191* 156*     Labs:  Lab Results  Component Value Date   WBC 4.1 11/12/2020   HGB 9.7 (L) 11/12/2020   HCT 27.0 (L) 11/12/2020   MCV 93.4 11/12/2020   PLT 12 (LL) 11/12/2020   NEUTROABS 2.3 11/11/2020    @LASTCHEMISTRY @  Urine Studies No results for input(s): UHGB, CRYS in the last 72 hours.  Invalid input(s): UACOL, UAPR, USPG, UPH, UTP, UGL, UKET, UBIL, UNIT, UROB, [place], UEPI, UWBC, [place], [place], [place], [place], [place]  Basic Metabolic Panel: Recent Labs  Lab 11/09/20 1147 11/09/20 1708 11/10/20 0337 11/11/20 0246 11/12/20 0200  NA 122*  --  123* 122* 122*  K 4.4  --  4.3 4.3 4.3  CL 88*  --  87* 86* 86*  CO2 26  --  26 26 26   GLUCOSE 48*  --  43* 129* 200*  BUN 26*  --  23* 23* 25*  CREATININE 0.76  --  0.88 0.85 0.99  CALCIUM 10.0  --  10.3 10.5* 10.8*  MG  --  1.9  --   --   --   PHOS  --  3.2  --   --   --    GFR Estimated Creatinine Clearance: 68.2 mL/min (by C-G formula based on SCr of 0.99  mg/dL). Liver Function Tests: Recent Labs  Lab 11/09/20 1147 11/10/20 0337 11/11/20 0246 11/12/20 0200  AST 202* 208* 196* 190*  ALT 64* 70* 70* 76*  ALKPHOS 350* 342* 313* 275*  BILITOT 9.3* 9.5* 9.6* 11.4*  PROT 5.6* 5.6* 5.1* 4.9*  ALBUMIN 1.9* 1.8* 1.7* 1.7*   Recent Labs  Lab 11/09/20 1147  LIPASE 134*   Recent Labs  Lab 11/09/20 1217  AMMONIA 140*   Coagulation profile Recent Labs  Lab 11/10/20 0337 11/12/20 0200  INR 1.6* 1.8*    CBC: Recent Labs  Lab 11/09/20 1147 11/10/20 0337 11/11/20 0246 11/12/20 0200  WBC 4.5 5.3 3.8* 4.1  NEUTROABS 3.8  --  2.3  --   HGB 11.3* 10.7* 9.8* 9.7*  HCT 32.6* 28.7* 27.3* 27.0*  MCV 95.9 92.0 92.9 93.4  PLT 22* 17* 10* 12*   Cardiac Enzymes: Recent Labs  Lab 11/09/20 1147  CKTOTAL 56   BNP: Invalid input(s): POCBNP CBG: Recent Labs  Lab 11/11/20 1549 11/11/20 2031 11/12/20 0039 11/12/20 0457 11/12/20 0745  GLUCAP  101* 188* 224* 191* 156*   D-Dimer No results for input(s): DDIMER in the last 72 hours. Hgb A1c No results for input(s): HGBA1C in the last 72 hours. Lipid Profile No results for input(s): CHOL, HDL, LDLCALC, TRIG, CHOLHDL, LDLDIRECT in the last 72 hours. Thyroid function studies No results for input(s): TSH, T4TOTAL, T3FREE, THYROIDAB in the last 72 hours.  Invalid input(s): FREET3 Anemia work up No results for input(s): VITAMINB12, FOLATE, FERRITIN, TIBC, IRON, RETICCTPCT in the last 72 hours. Microbiology Recent Results (from the past 240 hour(s))  Resp Panel by RT-PCR (Flu A&B, Covid) Nasopharyngeal Swab     Status: None   Collection Time: 11/03/20  8:43 AM   Specimen: Nasopharyngeal Swab; Nasopharyngeal(NP) swabs in vial transport medium  Result Value Ref Range Status   SARS Coronavirus 2 by RT PCR NEGATIVE NEGATIVE Final    Comment: (NOTE) SARS-CoV-2 target nucleic acids are NOT DETECTED.  The SARS-CoV-2 RNA is generally detectable in upper respiratory specimens during the  acute phase of infection. The lowest concentration of SARS-CoV-2 viral copies this assay can detect is 138 copies/mL. A negative result does not preclude SARS-Cov-2 infection and should not be used as the sole basis for treatment or other patient management decisions. A negative result may occur with  improper specimen collection/handling, submission of specimen other than nasopharyngeal swab, presence of viral mutation(s) within the areas targeted by this assay, and inadequate number of viral copies(<138 copies/mL). A negative result must be combined with clinical observations, patient history, and epidemiological information. The expected result is Negative.  Fact Sheet for Patients:  EntrepreneurPulse.com.au  Fact Sheet for Healthcare Providers:  IncredibleEmployment.be  This test is no t yet approved or cleared by the [place] FDA and  has been authorized for detection and/or diagnosis of SARS-CoV-2 by FDA under an Emergency Use Authorization (EUA). This EUA will remain  in effect (meaning this test can be used) for the duration of the COVID-19 declaration under Section 564(b)(1) of the Act, 21 U.S.C.section 360bbb-3(b)(1), unless the authorization is terminated  or revoked sooner.       Influenza A by PCR NEGATIVE NEGATIVE Final   Influenza B by PCR NEGATIVE NEGATIVE Final    Comment: (NOTE) The Xpert Xpress SARS-CoV-2/FLU/RSV plus assay is intended as an aid in the diagnosis of influenza from Nasopharyngeal swab specimens and should not be used as a sole basis for treatment. Nasal washings and aspirates are unacceptable for Xpert Xpress SARS-CoV-2/FLU/RSV testing.  Fact Sheet for Patients: EntrepreneurPulse.com.au  Fact Sheet for Healthcare Providers: IncredibleEmployment.be  This test is not yet approved or cleared by the [place] FDA and has been authorized for detection and/or  diagnosis of SARS-CoV-2 by FDA under an Emergency Use Authorization (EUA). This EUA will remain in effect (meaning this test can be used) for the duration of the COVID-19 declaration under Section 564(b)(1) of the Act, 21 U.S.C. section 360bbb-3(b)(1), unless the authorization is terminated or revoked.  Performed at [facility], [place] [address] [place], [place] [address]   Urine culture     Status: Abnormal   Collection Time: 11/09/20  1:14 PM   Specimen: Urine, Random  Result Value Ref Range Status   Specimen Description URINE, RANDOM  Final   Special Requests   Final    NONE Performed at [facility], [place] [address] [place], [place] [address]    Culture MULTIPLE SPECIES PRESENT, SUGGEST RECOLLECTION (A)  Final   Report Status 11/10/2020 FINAL  Final  Studies:  CT CHEST W CONTRAST  Result Date: 11/10/2020 CLINICAL DATA:  Cirrhosis with liver mass concerning for hepatocellular carcinoma. Chest staging. EXAM: CT CHEST WITH CONTRAST TECHNIQUE: Multidetector CT imaging of the chest was performed during intravenous contrast administration. CONTRAST:  39m OMNIPAQUE IOHEXOL 300 MG/ML  SOLN COMPARISON:  11/04/2020 CT abdomen/pelvis and 11/03/2020 MRI abdomen. FINDINGS: Cardiovascular: Normal heart size. No significant pericardial effusion/thickening. Great vessels are normal in course and caliber. No central pulmonary emboli. Mediastinum/Nodes: No discrete thyroid nodules. Small amount of layering oral contrast in the lower thoracic esophagus with chronic mild circumferential wall thickening in the lower thoracic esophagus. Mildly enlarged 1.1 cm short axis diameter right axillary node (series 3/image 35). Mild left supraclavicular adenopathy up to 1.1 cm (series 3/image 10). Mildly enlarged 1.0 cm right paratracheal node (series 3/image 38). No hilar adenopathy. Lungs/Pleura: No pneumothorax. Trace dependent bilateral pleural effusions, right greater than left. No acute  consolidative airspace disease or lung masses. Tiny right upper lobe 2 mm solid pulmonary nodule (series 4/image 57). Tiny calcified 3 mm right middle lobe granuloma. No additional significant pulmonary nodules. Upper abdomen: Diffusely irregular liver surface compatible with cirrhosis. Poorly defined heterogeneous hypodense central liver mass anterior to the IVC (series 3/image 90), better visualized on prior MRI abdomen as detailed on 11/04/2020 CT abdomen study. 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 Small volume ascites. Partially visualized splenomegaly. Musculoskeletal: No aggressive appearing focal osseous lesions. Marked thoracic spondylosis. IMPRESSION: 1. Mild right axillary, right paratracheal and left supraclavicular lymphadenopathy, nonspecific, cannot exclude nodal metastatic disease. The left supraclavicular adenopathy may be amenable to percutaneous biopsy if clinically warranted. 2. Tiny right upper lobe 2 mm solid pulmonary nodule, for which follow-up chest CT is recommended in 3 months. 3. Trace dependent bilateral pleural effusions, right greater than left. 4. Chronic mild circumferential wall thickening in the lower thoracic esophagus, nonspecific, potentially due to reflux esophagitis, Barrett's esophagus and/or neoplasm. 5. Cirrhosis. Known poorly defined heterogeneous hypodense central liver mass anterior to the IVC, better visualized on prior MRI abdomen study as detailed on 11/04/2020 CT abdomen study, suspicious for hepatocellular carcinoma. 6. Small volume ascites. 7. 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 Partially visualized splenomegaly. Electronically Signed   By: JIlona SorrelM.D.   On: 11/10/2020 20:24   NM Bone Scan Whole Body  Result Date: 11/11/2020 CLINICAL DATA:  HCC staging, suspicious bone lesions on CT. Evaluate for metastasis. EXAM: NUCLEAR MEDICINE WHOLE BODY BONE SCAN TECHNIQUE: Whole body anterior and posterior images were obtained  approximately 3 hours after intravenous injection of radiopharmaceutical. RADIOPHARMACEUTICALS:  20 mCi Technetium-912mDP IV COMPARISON:  Head CT 11/09/2020, chest CT 11/10/2020, abdominal pelvis CT 11/04/2020. No prior bone scan. FINDINGS: Patchy diffuse increased uptake throughout the calvarium suspicious for metastatic disease. There is increased uptake within both proximal humeri and at the shoulder joints. Minimal increased uptake involving the right proximal radius. Increased uptake throughout both femoral shafts, as well as the right femoral neck. There is patchy increased uptake involving the right aspect of the sacrum. Pelvic assessment is limited due to rotation. Focal increased uptake in the mid sternum. There is rib uptake on the right posteriorly in the midthoracic ribs. Left ribs are not as well evaluated due to positioning due to habitus. There is scattered increased uptake involving lower thorax and upper lumbar spine. Both kidneys and bladder are visualized. IMPRESSION: 1. Findings most consistent with osseous metastatic disease in the skull, sternum, right ribs, both femoral shafts as well as right  femoral neck. Right femoral neck could place patient at risk of pathologic fracture. 2. Lower thoracic and upper lumbar spine uptake, also suspicious for metastasis. 3. Uptake within both proximal humeri and at the shoulder joints may be degenerative or related to metastasis. Increased uptake in the right proximal radius is also indeterminate. Electronically Signed   By: Keith Rake M.D.   On: 11/11/2020 16:55    Assessment: 55 y.o. 1. 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 -11/04/2020  CT abdomen/pelvis with contrast-based on prior MRI, findings are highly suspicious for hepatocellular carcinoma involving the central aspect of segment 5 near the caudate lobe and IVC (area measures 4.7 x 4.3 cm). Additionally there are scattered indeterminate lucent bone lesions and cannot exclude metastatic disease. -11/04/2020 AFP 2899.0 -11/09/2020 CT head without contrast-unremarkable CT appearance of the brain, multiple small lucent skull lesions which are indeterminate but raise concern for metastatic disease. -11/10/2020 CT chest with contrast-mild right axillary, right paratracheal, and left supraclavicular lymphadenopathy which is nonspecific but could represent metastatic disease. 2. Decompensated NASHcirrhosis 3. Hepatic encephalopathy 4. Coagulopathy 5. Thrombocytopenia 6. Normocytic anemia 7. Hyponatremia 8. Hyperbilirubinemia and transaminitis secondary to cirrhosis 9. Dysphagia 10. Hypercalcemia     Plan:  I discussed the situation with the patient, her husband and daughter. They understand the patient has advanced cirrhosis. This is not curable or reversible. It causes multiple problems including clotting and bleeding abnormalities, fluid and electrolyte abnormalities, confusion, anorexia and nausea, and other issues. The goal of treating cirrhosis is to try to manage these complications with medications and this is being done to the best of our ability. Despite that the patient's functional status is very poor and she will be essentially bedbound.  In addition she has liver cancer based on the CT appearance and very elevated AFP. This can be treated, but the treatment has multiple side effects and they will certainly make the patient feel worse, not better. Dr Benay Spice doubts the patient would be able to tolerate even significantly watered-down therapy and I agree. Even if we treated the cancer and it improved, we would still be left with all the problems of cirrhosis  listed above.  Faced with this choice the family tells me they want Deborah Pham to be safe, comfortable, and as functional as possible, and want to take her home. They agree to a limited resuscitation (no CPR or intubation) and to a Hospice referral.  They will need a hospital bed, wheelchair, and bedside commode. They also request a walker and help completing disability and leave papers. Once her medications have been optimized it will be important for the discharging MD to go over them with an interpreter so the family can understand why the patient is receiving which medications and how to give them--they are very keen to learn how to care for the patient and want to do "everything right"! It will also be helpful if the Hospice nurse can plan a visit on the day of discharge, if that can be arranged.  Will follow with you     Chauncey Cruel, MD 11/12/2020  10:31 AM Medical Oncology and Hematology [facility] [address] [place], [place] [address] Tel. [phone]    Fax. [phone]

## 2020-11-12 NOTE — Progress Notes (Signed)
[facility])  Referral received from [place] for hospice services at home.  [facility] will meet with family at the bedside in am to discuss d/c planning and order necessary DME.  Thank you, Venia Carbon RN, BSN, [place] [facility] Liaison

## 2020-11-12 NOTE — Care Management (Signed)
Spoke w patient's spouse and daughter at bedside. Reviewed wishes for home hospice care. They would like hospital bed at home, confirmed w attending that she will not need home oxygen. Patient will need medical transport home. Discussed home hospice providers and they are agreeable to referral to [facility]. Referral given to [facility], and she will visit with patient and family in the morning.

## 2020-11-13 LAB — CALCIUM, IONIZED
Calcium, Ionized, Serum: 5.6 mg/dL (ref 4.5–5.6)
Calcium, Ionized, Serum: 5.8 mg/dL — ABNORMAL HIGH (ref 4.5–5.6)

## 2020-11-13 MED ORDER — LORAZEPAM 2 MG/ML IJ SOLN: 1.0000 mg | INTRAMUSCULAR | Status: DC | PRN

## 2020-11-13 MED ORDER — GLYCOPYRROLATE 0.2 MG/ML IJ SOLN: 0.2000 mg | INTRAMUSCULAR | Status: DC | PRN

## 2020-11-13 MED ORDER — GLYCOPYRROLATE 1 MG PO TABS: 1.0000 mg | ORAL_TABLET | ORAL | Status: DC | PRN

## 2020-11-13 MED ORDER — LACTULOSE 10 GM/15ML PO SOLN: 10.0000 g | Freq: Three times a day (TID) | ORAL | 0 refills | Status: AC

## 2020-11-13 MED ORDER — MORPHINE SULFATE (CONCENTRATE) 10 MG/0.5ML PO SOLN: 5.0000 mg | ORAL | Status: DC | PRN

## 2020-11-13 MED ORDER — LORAZEPAM 2 MG/ML PO CONC: 1.0000 mg | ORAL | Status: DC | PRN

## 2020-11-13 MED ORDER — GLYCOPYRROLATE 1 MG PO TABS: 1.0000 mg | ORAL_TABLET | ORAL | 0 refills | Status: AC | PRN

## 2020-11-13 MED ORDER — MORPHINE SULFATE (CONCENTRATE) 10 MG/0.5ML PO SOLN: 5.0000 mg | ORAL | 0 refills | Status: AC | PRN

## 2020-11-13 MED ORDER — LORAZEPAM 1 MG PO TABS: 1.0000 mg | ORAL_TABLET | ORAL | Status: DC | PRN

## 2020-11-13 MED ORDER — LORAZEPAM 1 MG PO TABS: 1.0000 mg | ORAL_TABLET | ORAL | 0 refills | Status: AC | PRN

## 2020-11-13 NOTE — Progress Notes (Signed)
Manufacturing engineer [facility])  Spoke with Faustino via interpreter line [phone] Barbaraann Rondo) to confirm interest in hospice support for his wife [name] once she is discharged home later today.  Explained services and provided support.  DME discussed, [facility] will order hospital bed, wheelchair, walker, and 3n1. This DME should be delivered today. I advised Faustino to let the RN taking care of [name] know when the DME has been delivered. After this, transport can be arranged for her to d/c home.  Since [name] is medicaid pending, [facility] will provide our services (RN, aide, SW, MD, DME) at no charge. We are unable to cover medications unfortunately. I updated Faustino that we are not able to cover medications. If there is any option for medication assistance in the hospital, please help this family with this. Please arrange for any comfort medications that may be needed so there is no lapse in her comfort. Please d/c any unnecessary medications that will not directly impact her QOL.   Thank you, Venia Carbon RN, BSN, [place] [facility] Liaison

## 2020-11-13 NOTE — TOC Transition Note (Signed)
Transition of Care [facility]) - CM/SW Discharge Note   Patient Details  Name: Deborah Pham MRN: [id number] Date of Birth: 08-17-1966  Transition of Care [facility]) CM/SW Contact:  Carles Collet, RN Phone Number: 11/13/2020, 12:57 PM   Clinical Narrative:    [place] letter provided for DC meds, override provided for scheduled meds due to patient discharging on hospice care.  Hospice will cover case pro bono but does not provide meds. Spoke w patient and family in room. All equipment has been delivered to the house. [facility] has been called for transport. Bedside nurse made aware. Requested DNR form from MD          Patient Goals and CMS Choice        Discharge Placement                       Discharge Plan and Services                                     Social Determinants of Health (SDOH) Interventions     Readmission Risk Interventions No flowsheet data found.

## 2020-11-13 NOTE — Progress Notes (Signed)
COURTESY NOTE:  See my note from 11/12/2020 for details of this case.  Met briefly with family and patient this AM. They are appreciative of all Palliative Care and the treatment team are doing and hope to have the equipment at home today and get [place] home under care of Hospice soon.  Please let us know if we can be of further help.

## 2020-11-13 NOTE — Discharge Summary (Signed)
Name: Deborah Pham MRN: [id number] DOB: 31-Mar-1966 55 y.o. PCP: [name], MD  Date of Admission: 11/09/2020 11:18 AM Date of Discharge: 11/13/2020 Attending Physician: Aldine Contes, MD  Discharge Diagnosis: 1. Active Problems:   Other cirrhosis of liver (HCC)   Type 2 diabetes mellitus with complication (HCC)   Decompensated hepatic cirrhosis (HCC)   Hyponatremia   Hepatocellular carcinoma (HCC)   Hyperbilirubinemia   Hypoglycemia   Encephalopathy   Liver cirrhosis ([place])  Discharge Medications: Allergies as of 11/13/2020      Reactions   Lisinopril Cough      Medication List    STOP taking these medications   carvedilol 3.125 MG tablet Commonly known as: COREG   Contour Next Test test strip Generic drug: glucose blood   glipiZIDE 10 MG tablet Commonly known as: GLUCOTROL     TAKE these medications   furosemide 80 MG tablet Commonly known as: LASIX Take 1 tablet (80 mg total) by mouth daily.   glycopyrrolate 1 MG tablet Commonly known as: ROBINUL Take 1 tablet (1 mg total) by mouth every 4 (four) hours as needed (excessive secretions).   lactulose 10 GM/15ML solution Commonly known as: CHRONULAC Take 15 mLs (10 g total) by mouth 3 (three) times daily.   LORazepam 1 MG tablet Commonly known as: ATIVAN Take 1 tablet (1 mg total) by mouth every 4 (four) hours as needed for anxiety.   morphine CONCENTRATE 10 MG/0.5ML Soln concentrated solution Take 0.25 mLs (5 mg total) by mouth every 2 (two) hours as needed for moderate pain (or dyspnea).   spironolactone 100 MG tablet Commonly known as: ALDACTONE Take 2 tablets (200 mg total) by mouth daily.      Disposition and follow-up:   Deborah Pham was discharged from [facility] in Critical condition. Patient discharged home with home hospice services and will follow-up with their organization upon discharge.  Follow-up Appointments:  Follow-up Information     [facility]. Go in 1 day(s).   Specialty: Hospice and Palliative Medicine Contact information: [place] [facility] Course by problem list: 1. Metastatic hepatocellular carcinoma/decompensated hepatic cirrhosis: Patient admitted for altered mental status 2/2 hypoglycemia. Upon resolution of patient's hypoglycemia, patient's mentation noted to continue to be poor which was attributed to hepatic encephalopathy. Patient's home lactulose was titrated up to 30g three times daily with improvement in her mentation. Shared decision making made with family to pursue further workup of suspected hepatocellular carcinoma contributing to further decompensation of her hepatic cirrhosis. Palliative medicine, oncology and gastroenterology were consulted. Patient underwent CT Chest and Bone Scan which revealed diffuse osseus metastatic lesions and confirmed hepatocellular carcinoma. Due to patient's advanced cirrhosis with metastases and poor functional status, patient and patient's family desired to pursue DNR and home hospice. On day of discharge, patient's medication regimen included spironolactone 230m daily, furosemide 848mdaily, lactulose 10g three times daily. She was discharged with additonal comfort medications including morphine, lorazepam and glycopyrollate. Patient had DME delivered to her home including hospital bed, wheelchair, wheelchair cushion and 3in1. Patient arranged home hospice with [facility] who will continue to follow patient upon discharge. All questions and concerns addressed on day of discharge.   Pertinent Labs, Studies, and Procedures:  CBC Latest Ref Rng & Units 11/12/2020 11/11/2020 11/10/2020  WBC 4.0 - 10.5 K/uL 4.1 3.8(L) 5.3  Hemoglobin 12.0 - 15.0 g/dL 9.7(L) 9.8(L) 10.7(L)  Hematocrit 36.0 - 46.0 % 27.0(L) 27.3(L) 28.7(L)  Platelets 150 - 400 K/uL 12(LL) 10(LL) 17(LL)   CMP Latest Ref Rng & Units  11/12/2020 11/11/2020 11/10/2020  Glucose 70 - 99 mg/dL 200(H) 129(H) 43(LL)  BUN 6 - 20 mg/dL 25(H) 23(H) 23(H)  Creatinine 0.44 - 1.00 mg/dL 0.99 0.85 0.88  Sodium 135 - 145 mmol/L 122(L) 122(L) 123(L)  Potassium 3.5 - 5.1 mmol/L 4.3 4.3 4.3  Chloride 98 - 111 mmol/L 86(L) 86(L) 87(L)  CO2 22 - 32 mmol/L 26 26 26   Calcium 8.9 - 10.3 mg/dL 10.8(H) 10.5(H) 10.3  Total Protein 6.5 - 8.1 g/dL 4.9(L) 5.1(L) 5.6(L)  Total Bilirubin 0.3 - 1.2 mg/dL 11.4(H) 9.6(H) 9.5(H)  Alkaline Phos 38 - 126 U/L 275(H) 313(H) 342(H)  AST 15 - 41 U/L 190(H) 196(H) 208(H)  ALT 0 - 44 U/L 76(H) 70(H) 70(H)   CT CHEST W CONTRAST  Result Date: 11/10/2020 CLINICAL DATA:  Cirrhosis with liver mass concerning for hepatocellular carcinoma. Chest staging. EXAM: CT CHEST WITH CONTRAST TECHNIQUE: Multidetector CT imaging of the chest was performed during intravenous contrast administration. CONTRAST:  66m OMNIPAQUE IOHEXOL 300 MG/ML  SOLN COMPARISON:  11/04/2020 CT abdomen/pelvis and 11/03/2020 MRI abdomen. FINDINGS: Cardiovascular: Normal heart size. No significant pericardial effusion/thickening. Great vessels are normal in course and caliber. No central pulmonary emboli. Mediastinum/Nodes: No discrete thyroid nodules. Small amount of layering oral contrast in the lower thoracic esophagus with chronic mild circumferential wall thickening in the lower thoracic esophagus. Mildly enlarged 1.1 cm short axis diameter right axillary node (series 3/image 35). Mild left supraclavicular adenopathy up to 1.1 cm (series 3/image 10). Mildly enlarged 1.0 cm right paratracheal node (series 3/image 38). No hilar adenopathy. Lungs/Pleura: No pneumothorax. Trace dependent bilateral pleural effusions, right greater than left. No acute consolidative airspace disease or lung masses. Tiny right upper lobe 2 mm solid pulmonary nodule (series 4/image 57). Tiny calcified 3 mm right middle lobe granuloma. No additional significant pulmonary nodules. Upper  abdomen: Diffusely irregular liver surface compatible with cirrhosis. Poorly defined heterogeneous hypodense central liver mass anterior to the IVC (series 3/image 90), better visualized on prior MRI abdomen as detailed on 11/04/2020 CT abdomen study. 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 Small volume ascites. Partially visualized splenomegaly. Musculoskeletal: No aggressive appearing focal osseous lesions. Marked thoracic spondylosis. IMPRESSION: 1. Mild right axillary, right paratracheal and left supraclavicular lymphadenopathy, nonspecific, cannot exclude nodal metastatic disease. The left supraclavicular adenopathy may be amenable to percutaneous biopsy if clinically warranted. 2. Tiny right upper lobe 2 mm solid pulmonary nodule, for which follow-up chest CT is recommended in 3 months. 3. Trace dependent bilateral pleural effusions, right greater than left. 4. Chronic mild circumferential wall thickening in the lower thoracic esophagus, nonspecific, potentially due to reflux esophagitis, Barrett's esophagus and/or neoplasm. 5. Cirrhosis. Known poorly defined heterogeneous hypodense central liver mass anterior to the IVC, better visualized on prior MRI abdomen study as detailed on 11/04/2020 CT abdomen study, suspicious for hepatocellular carcinoma. 6. Small volume ascites. 7. 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 Partially visualized splenomegaly. Electronically Signed   By: JIlona SorrelM.D.   On: 11/10/2020 20:24   NM Bone Scan Whole Body  Result Date: 11/11/2020 CLINICAL DATA:  HCC staging, suspicious bone lesions on CT. Evaluate for metastasis. EXAM: NUCLEAR MEDICINE WHOLE BODY BONE SCAN TECHNIQUE: Whole body anterior and posterior images were obtained approximately 3 hours after intravenous injection of radiopharmaceutical. RADIOPHARMACEUTICALS:  20 mCi Technetium-970mDP IV COMPARISON:  Head CT 11/09/2020,  chest CT 11/10/2020, abdominal pelvis CT 11/04/2020. No prior bone  scan. FINDINGS: Patchy diffuse increased uptake throughout the calvarium suspicious for metastatic disease. There is increased uptake within both proximal humeri and at the shoulder joints. Minimal increased uptake involving the right proximal radius. Increased uptake throughout both femoral shafts, as well as the right femoral neck. There is patchy increased uptake involving the right aspect of the sacrum. Pelvic assessment is limited due to rotation. Focal increased uptake in the mid sternum. There is rib uptake on the right posteriorly in the midthoracic ribs. Left ribs are not as well evaluated due to positioning due to habitus. There is scattered increased uptake involving lower thorax and upper lumbar spine. Both kidneys and bladder are visualized. IMPRESSION: 1. Findings most consistent with osseous metastatic disease in the skull, sternum, right ribs, both femoral shafts as well as right femoral neck. Right femoral neck could place patient at risk of pathologic fracture. 2. Lower thoracic and upper lumbar spine uptake, also suspicious for metastasis. 3. Uptake within both proximal humeri and at the shoulder joints may be degenerative or related to metastasis. Increased uptake in the right proximal radius is also indeterminate. Electronically Signed   By: Keith Rake M.D.   On: 11/11/2020 16:55   Discharge Instructions: Discharge Instructions    Diet general   Complete by: As directed    Discharge instructions   Complete by: As directed    Sra. Harvest Dark,  Fue un placer tener la oportunidad de cuidar de usted mientras estuvo en el hospital. Lo enviaremos a casa con algunos medicamentos nuevos.  -La morfina se puede usar para Conservation officer, historic buildings o la dificultad para respirar -Lorazepam (Ativan) se puede usar para la ansiedad -Se puede usar glicopirrolato (Robinul) para secreciones excesivas.  Puede continuar tomando 200 mg de espironolactona al da, 80 mg de lasix al da y 10 g de lactulosa tres veces  al [employer]. Suspenda todos los dems medicamentos en el hogar, incluidos el carvedilol y la glipizida.  Atentamente, Dr. Paulla Dolly, MD     Signed: Cato Mulligan, MD 11/13/2020, 3:51 PM   Pager: [phone]

## 2020-11-13 NOTE — Progress Notes (Signed)
Family verbalized understanding of all discharge instructions and medication regimen as well as which pharmacy to go to for medication pick up.  PIV removed.  Family given all patient belongings.  Waiting for [facility] to pick up patient for transport home.

## 2020-11-13 NOTE — Discharge Instructions (Signed)
Deborah Pham,  Fue un placer tener la oportunidad de cuidar de usted mientras estuvo en el hospital. Lo enviaremos a casa con algunos medicamentos nuevos.  -La morfina se puede usar para Conservation officer, historic buildings o la dificultad para respirar -Lorazepam (Ativan) se puede usar para la ansiedad -Se puede usar glicopirrolato (Robinul) para secreciones excesivas.  Puede continuar tomando 200 mg de espironolactona al da, 80 mg de lasix al da y 10 g de lactulosa tres veces al [employer]. Suspenda todos los dems medicamentos en el hogar, incluidos el carvedilol y la glipizida.  Atentamente, Dr. Paulla Dolly, MD  [name],  It was a pleasure having the opportunity to take care of you while you were in the hospital. We will be sending you home with some new medications.  -Morphine can be used for pain or difficulty breathing -Lorazepam (Ativan) can be used for anxiety -Glycopyrrolate (Robinul) can be used for excessive secretions.  You may continue taking spironolactone 256m daily, lasix 842mdaily and lactulose 10g three times daily. Please stop all other medications at home including the carvedilol and glipizide.  Sincerely, Dr. MaPaulla DollyMD

## 2020-11-13 NOTE — Progress Notes (Signed)
   Subjective:   Overnight, no acute events.  This morning, patient reports that she feels very well and denies any complaints. She denies abdominal pain or nausea. Patient's husband and son at bedside. Patient's family reports that the DME has been delivered to patient's home, however she will need medications in order to discharge safely. We reviewed the medications for discharge. Patient and patient's family have no further requests for our team at this time.   Objective:  Vital signs in last 24 hours: Vitals:   11/12/20 1338 11/12/20 2143 11/13/20 0036 11/13/20 0410  BP: (!) 97/52 104/68 115/73 (!) 107/34  Pulse: (!) 101 (!) 101 (!) 115 (!) 117  Resp: 20 (!) 21 19 20   Temp:  98.5 F (36.9 C) 98.1 F (36.7 C) 98 F (36.7 C)  TempSrc:  Oral Oral Oral  SpO2: 94% 97% 100% 99%  On room air  Intake/Output Summary (Last 24 hours) at 11/13/2020 8299 Last data filed at 11/13/2020 0000 Gross per 24 hour  Intake 420 ml  Output -  Net 420 ml   Physical Exam Constitutional:      Appearance: She is obese. She is ill-appearing.  Eyes:     General: Scleral icterus present.     Extraocular Movements: Extraocular movements intact.  Abdominal:     General: There is distension.     Palpations: Abdomen is soft.  Musculoskeletal:     Right lower leg: Edema present.     Left lower leg: Edema present.  Psychiatric:        Mood and Affect: Mood normal.        Behavior: Behavior normal.    Labs in last 24 hours: Glucose - 156, 218  Imaging in last 24 hours: No results found.  Assessment/Plan:  Active Problems:   Other cirrhosis of liver (HCC)   Type 2 diabetes mellitus with complication ([place])   Decompensated hepatic cirrhosis ([place])   Hyponatremia   Hepatocellular carcinoma ([place])   Hyperbilirubinemia   Hypoglycemia   Encephalopathy   Liver cirrhosis (HCC)  Deborah Pham is a 55 year old woman with past medical history significant for cirrhosis 2/2 NASH and T2DM who  was admitted for decompensated hepatic cirrhosis, hypoglycemia and found to have metastatic hepatocellular carcinoma.  #Hepatocellular carcinoma with osseous metastatic disease Patient and patient's family are prepared for discharge home with home hospice services. DME has been delivered to patient's home including hospital bed, wheelchair, walker and 3in1. We will work with TOC and CSW to have patient's comfort medications prepared prior to discharge.  -Medical oncology following, appreciate recommendations -Palliative medicine team following, appreciate recommendations -TOC following, appreciate recommendations -DNR/DNI -Home hospice -Discharge with lorazepam, morphine and robinul  #Decompensated hepatic cirrhosis #Hepatic encephalopathy Patient following conversation well and able to participate. She continues to deny any complaints. -Discharge with lactulose 10g three times daily -Continue furosemide 56m daily -Continue spironolactone 2057mdaily   #Type 2 diabetes mellitus -Discontinue medical management and monitoring -Regular diet  JoCato MulliganMD 11/13/2020, 6:33 AM Pager: [phone] 5pm on weekdays and 1pm on weekends: On Call pager [phone]

## 2020-11-14 ENCOUNTER — Telehealth: Payer: Self-pay | Admitting: *Deleted

## 2020-11-14 ENCOUNTER — Telehealth: Payer: Self-pay

## 2020-11-14 NOTE — Telephone Encounter (Signed)
Horris Latino with [facility] called to confirm Dr. Gilford Rile would be signing orders for patient's hospice care. SOC will be 1030 this AM.

## 2020-11-14 NOTE — Telephone Encounter (Signed)
Pls contact Horris Latino from [place]

## 2020-11-14 NOTE — Telephone Encounter (Signed)
I will be signing the orders for Deborah Pham.

## 2020-11-14 NOTE — Telephone Encounter (Signed)
Returned call to [place]. States she needs the name of an Attending to enter into her system. Gave today's Attending (Guill[place]).

## 2020-11-23 ENCOUNTER — Ambulatory Visit: Payer: No Typology Code available for payment source | Admitting: Physician Assistant

## 2020-11-23 ENCOUNTER — Telehealth: Payer: Self-pay | Admitting: Gastroenterology

## 2020-11-23 NOTE — Telephone Encounter (Signed)
Spoke with Deborah Pham in regards to request. Advised that the patient's PCP usually signs the death certificate. I provided her with the [facility] phone number because we do not have a PCP listed.

## 2020-11-23 NOTE — Telephone Encounter (Signed)
Noted  

## 2020-11-23 NOTE — Telephone Encounter (Signed)
Miss Deborah Pham from [facility] states DR Loletha Carrow name is not appearing in the electronic system to sign the death certificate for the pt, caller would like to know if the provider is letting someone else sign for him.  CB [phone]

## 2020-11-23 NOTE — Telephone Encounter (Signed)
I am very sorry to hear of Deborah Pham's passing.  You are correct,as her GI doctor, I would not be the physician to sign the death certificate.  I do not know for sure but suspect that falls to the physician overseeing her hospice services.  - HD

## 2020-11-25 DEATH — deceased

## 2020-11-30 ENCOUNTER — Encounter: Payer: Self-pay | Admitting: Internal Medicine

## 2020-12-02 ENCOUNTER — Ambulatory Visit: Payer: No Typology Code available for payment source | Admitting: Physician Assistant

## 2020-12-22 ENCOUNTER — Encounter: Payer: No Typology Code available for payment source | Admitting: Student
